# Patient Record
Sex: Male | Born: 1935 | Race: White | Hispanic: No | Smoking: Former smoker
Health system: Southern US, Community
[De-identification: ages and names within clinical notes are randomized; demographics above are authoritative.]

## PROBLEM LIST (undated history)

## (undated) DIAGNOSIS — N400 Enlarged prostate without lower urinary tract symptoms: Secondary | ICD-10-CM

## (undated) DIAGNOSIS — C801 Malignant (primary) neoplasm, unspecified: Secondary | ICD-10-CM

## (undated) DIAGNOSIS — E785 Hyperlipidemia, unspecified: Secondary | ICD-10-CM

## (undated) DIAGNOSIS — G459 Transient cerebral ischemic attack, unspecified: Secondary | ICD-10-CM

## (undated) DIAGNOSIS — K219 Gastro-esophageal reflux disease without esophagitis: Secondary | ICD-10-CM

## (undated) DIAGNOSIS — I340 Nonrheumatic mitral (valve) insufficiency: Secondary | ICD-10-CM

## (undated) DIAGNOSIS — I4891 Unspecified atrial fibrillation: Secondary | ICD-10-CM

## (undated) DIAGNOSIS — N1832 Chronic kidney disease, stage 3b: Secondary | ICD-10-CM

## (undated) DIAGNOSIS — I10 Essential (primary) hypertension: Secondary | ICD-10-CM

## (undated) DIAGNOSIS — I499 Cardiac arrhythmia, unspecified: Secondary | ICD-10-CM

## (undated) DIAGNOSIS — I714 Abdominal aortic aneurysm, without rupture, unspecified: Secondary | ICD-10-CM

## (undated) DIAGNOSIS — K279 Peptic ulcer, site unspecified, unspecified as acute or chronic, without hemorrhage or perforation: Secondary | ICD-10-CM

## (undated) DIAGNOSIS — I5020 Unspecified systolic (congestive) heart failure: Secondary | ICD-10-CM

## (undated) DIAGNOSIS — E119 Type 2 diabetes mellitus without complications: Secondary | ICD-10-CM

## (undated) DIAGNOSIS — K4021 Bilateral inguinal hernia, without obstruction or gangrene, recurrent: Secondary | ICD-10-CM

## (undated) DIAGNOSIS — Z951 Presence of aortocoronary bypass graft: Secondary | ICD-10-CM

## (undated) DIAGNOSIS — K449 Diaphragmatic hernia without obstruction or gangrene: Secondary | ICD-10-CM

## (undated) DIAGNOSIS — N200 Calculus of kidney: Secondary | ICD-10-CM

## (undated) DIAGNOSIS — I739 Peripheral vascular disease, unspecified: Secondary | ICD-10-CM

## (undated) DIAGNOSIS — K37 Unspecified appendicitis: Secondary | ICD-10-CM

## (undated) DIAGNOSIS — Z9289 Personal history of other medical treatment: Secondary | ICD-10-CM

## (undated) DIAGNOSIS — N183 Chronic kidney disease, stage 3 unspecified: Secondary | ICD-10-CM

## (undated) DIAGNOSIS — Z7901 Long term (current) use of anticoagulants: Secondary | ICD-10-CM

## (undated) DIAGNOSIS — Z87442 Personal history of urinary calculi: Secondary | ICD-10-CM

## (undated) DIAGNOSIS — L570 Actinic keratosis: Secondary | ICD-10-CM

## (undated) DIAGNOSIS — E291 Testicular hypofunction: Secondary | ICD-10-CM

## (undated) DIAGNOSIS — I219 Acute myocardial infarction, unspecified: Secondary | ICD-10-CM

## (undated) DIAGNOSIS — M1990 Unspecified osteoarthritis, unspecified site: Secondary | ICD-10-CM

## (undated) DIAGNOSIS — I259 Chronic ischemic heart disease, unspecified: Secondary | ICD-10-CM

## (undated) DIAGNOSIS — M4800 Spinal stenosis, site unspecified: Secondary | ICD-10-CM

## (undated) DIAGNOSIS — K4090 Unilateral inguinal hernia, without obstruction or gangrene, not specified as recurrent: Secondary | ICD-10-CM

## (undated) HISTORY — DX: Unspecified systolic (congestive) heart failure: I50.20

## (undated) HISTORY — DX: Actinic keratosis: L57.0

## (undated) HISTORY — DX: Calculus of kidney: N20.0

## (undated) HISTORY — DX: Presence of aortocoronary bypass graft: Z95.1

## (undated) HISTORY — PX: EYE SURGERY: SHX253

## (undated) HISTORY — PX: ABDOMINAL AORTIC ANEURYSM REPAIR: SUR1152

## (undated) HISTORY — PX: APPENDECTOMY: SHX54

## (undated) HISTORY — DX: Abdominal aortic aneurysm, without rupture, unspecified: I71.40

## (undated) HISTORY — PX: BRAIN SURGERY: SHX531

## (undated) HISTORY — PX: CATARACT EXTRACTION W/ INTRAOCULAR LENS IMPLANT: SHX1309

## (undated) HISTORY — DX: Transient cerebral ischemic attack, unspecified: G45.9

## (undated) HISTORY — DX: Abdominal aortic aneurysm, without rupture: I71.4

---

## 1988-01-25 DIAGNOSIS — Z9289 Personal history of other medical treatment: Secondary | ICD-10-CM

## 1988-01-25 DIAGNOSIS — I219 Acute myocardial infarction, unspecified: Secondary | ICD-10-CM

## 1988-01-25 DIAGNOSIS — I2510 Atherosclerotic heart disease of native coronary artery without angina pectoris: Secondary | ICD-10-CM

## 1988-01-25 HISTORY — DX: Atherosclerotic heart disease of native coronary artery without angina pectoris: I25.10

## 1988-01-25 HISTORY — DX: Acute myocardial infarction, unspecified: I21.9

## 1988-01-25 HISTORY — DX: Personal history of other medical treatment: Z92.89

## 1988-01-25 HISTORY — PX: CARDIAC CATHETERIZATION: SHX172

## 1988-01-25 HISTORY — PX: OTHER SURGICAL HISTORY: SHX169

## 1988-01-25 HISTORY — PX: CORONARY ARTERY BYPASS GRAFT: SHX141

## 1988-01-25 HISTORY — PX: CARDIAC SURGERY: SHX584

## 1988-07-01 DIAGNOSIS — I2109 ST elevation (STEMI) myocardial infarction involving other coronary artery of anterior wall: Secondary | ICD-10-CM

## 1988-07-01 HISTORY — DX: ST elevation (STEMI) myocardial infarction involving other coronary artery of anterior wall: I21.09

## 1988-07-01 HISTORY — PX: LEFT HEART CATH AND CORONARY ANGIOGRAPHY: CATH118249

## 1988-07-01 HISTORY — PX: PTCA: SHX146

## 1988-07-07 HISTORY — PX: LEFT HEART CATH AND CORONARY ANGIOGRAPHY: CATH118249

## 1988-07-15 DIAGNOSIS — Z951 Presence of aortocoronary bypass graft: Secondary | ICD-10-CM

## 1988-07-15 HISTORY — PX: CORONARY ARTERY BYPASS GRAFT: SHX141

## 1988-07-15 HISTORY — DX: Presence of aortocoronary bypass graft: Z95.1

## 1999-05-20 ENCOUNTER — Emergency Department (HOSPITAL_COMMUNITY): Admission: EM | Admit: 1999-05-20 | Discharge: 1999-05-20 | Payer: Self-pay

## 1999-05-20 ENCOUNTER — Encounter: Payer: Self-pay | Admitting: Emergency Medicine

## 2000-12-26 HISTORY — PX: ABDOMINAL AORTIC ANEURYSM REPAIR: SUR1152

## 2003-07-31 ENCOUNTER — Other Ambulatory Visit: Payer: Self-pay

## 2004-03-10 ENCOUNTER — Ambulatory Visit: Payer: Self-pay | Admitting: Internal Medicine

## 2004-05-19 ENCOUNTER — Ambulatory Visit: Payer: Self-pay | Admitting: Internal Medicine

## 2007-03-28 ENCOUNTER — Other Ambulatory Visit: Payer: Self-pay

## 2007-03-28 ENCOUNTER — Emergency Department: Payer: Self-pay | Admitting: Emergency Medicine

## 2008-08-30 ENCOUNTER — Emergency Department: Payer: Self-pay | Admitting: Unknown Physician Specialty

## 2008-09-10 ENCOUNTER — Ambulatory Visit: Payer: Self-pay | Admitting: General Surgery

## 2008-11-10 HISTORY — PX: CHOLECYSTECTOMY: SHX55

## 2009-08-27 ENCOUNTER — Emergency Department: Payer: Self-pay | Admitting: Emergency Medicine

## 2010-02-24 ENCOUNTER — Ambulatory Visit: Payer: Self-pay | Admitting: Internal Medicine

## 2010-10-18 ENCOUNTER — Ambulatory Visit: Payer: Self-pay | Admitting: Internal Medicine

## 2011-10-26 ENCOUNTER — Ambulatory Visit: Payer: Self-pay | Admitting: Ophthalmology

## 2011-12-20 ENCOUNTER — Observation Stay: Payer: Self-pay | Admitting: Internal Medicine

## 2011-12-20 LAB — CBC
HCT: 37.4 % — ABNORMAL LOW (ref 40.0–52.0)
HGB: 12.9 g/dL — ABNORMAL LOW (ref 13.0–18.0)
MCH: 30.5 pg (ref 26.0–34.0)
MCHC: 34.3 g/dL (ref 32.0–36.0)
MCV: 89 fL (ref 80–100)
Platelet: 186 10*3/uL (ref 150–440)
RBC: 4.21 10*6/uL — ABNORMAL LOW (ref 4.40–5.90)
RDW: 14.1 % (ref 11.5–14.5)
WBC: 10.1 10*3/uL (ref 3.8–10.6)

## 2011-12-20 LAB — BASIC METABOLIC PANEL
Anion Gap: 9 (ref 7–16)
BUN: 25 mg/dL — ABNORMAL HIGH (ref 7–18)
Calcium, Total: 8.8 mg/dL (ref 8.5–10.1)
Chloride: 108 mmol/L — ABNORMAL HIGH (ref 98–107)
Co2: 27 mmol/L (ref 21–32)
Creatinine: 1.54 mg/dL — ABNORMAL HIGH (ref 0.60–1.30)
EGFR (African American): 49 — ABNORMAL LOW
EGFR (Non-African Amer.): 42 — ABNORMAL LOW
Glucose: 158 mg/dL — ABNORMAL HIGH (ref 65–99)
Osmolality: 295 (ref 275–301)
Potassium: 3.3 mmol/L — ABNORMAL LOW (ref 3.5–5.1)
Sodium: 144 mmol/L (ref 136–145)

## 2011-12-20 LAB — HEPATIC FUNCTION PANEL A (ARMC)
Albumin: 3.5 g/dL (ref 3.4–5.0)
Alkaline Phosphatase: 70 U/L (ref 50–136)
Bilirubin, Direct: <0.1 mg/dL (ref 0.00–0.20)
Bilirubin,Total: 0.4 mg/dL (ref 0.2–1.0)
SGOT(AST): 23 U/L (ref 15–37)
SGPT (ALT): 27 U/L (ref 12–78)
Total Protein: 6.5 g/dL (ref 6.4–8.2)

## 2011-12-20 LAB — MAGNESIUM: Magnesium: 1.7 mg/dL — ABNORMAL LOW

## 2011-12-20 LAB — CK TOTAL AND CKMB (NOT AT ARMC)
CK, Total: 57 U/L (ref 35–232)
CK-MB: <0.5 ng/mL — ABNORMAL LOW (ref 0.5–3.6)

## 2011-12-20 LAB — PROTIME-INR
INR: 1
Prothrombin Time: 13.1 secs (ref 11.5–14.7)

## 2011-12-20 LAB — TSH: Thyroid Stimulating Horm: 1.22 u[IU]/mL

## 2011-12-20 LAB — TROPONIN I: Troponin-I: <0.02 ng/mL

## 2011-12-20 LAB — APTT: Activated PTT: <23 secs — ABNORMAL LOW (ref 23.6–35.9)

## 2013-11-28 DIAGNOSIS — I259 Chronic ischemic heart disease, unspecified: Secondary | ICD-10-CM | POA: Insufficient documentation

## 2013-11-28 DIAGNOSIS — N289 Disorder of kidney and ureter, unspecified: Secondary | ICD-10-CM | POA: Insufficient documentation

## 2013-11-28 DIAGNOSIS — E119 Type 2 diabetes mellitus without complications: Secondary | ICD-10-CM | POA: Insufficient documentation

## 2013-11-28 DIAGNOSIS — M4800 Spinal stenosis, site unspecified: Secondary | ICD-10-CM | POA: Insufficient documentation

## 2013-11-28 DIAGNOSIS — I10 Essential (primary) hypertension: Secondary | ICD-10-CM | POA: Insufficient documentation

## 2014-03-25 ENCOUNTER — Emergency Department: Payer: Self-pay | Admitting: Student

## 2014-03-27 ENCOUNTER — Ambulatory Visit: Payer: Self-pay | Admitting: Internal Medicine

## 2014-05-13 NOTE — H&P (Signed)
PATIENT NAME:  [name], [name] MR#:  [id number] DATE OF BIRTH:  June 25, 1930  DATE OF ADMISSION:  12/20/2011  REFERRING PHYSICIAN: Dr. Renard Hamper  PRIMARY CARE PHYSICIAN: Dr. Lisette Grinder   CHIEF COMPLAINT: Chest pain.   HISTORY OF PRESENT ILLNESS: This is a 81 year old male with known past medical history of hypertension, diabetes, hyperlipidemia and history of coronary artery disease status post CABG in 1990 presents with complaint of chest pain. Patient reports chest pain started this evening where it woke him up from sleep, where he reports it has been mostly chest pressure in the left retrosternal area, nonradiating and happened in sleep. Patient reports chest pain resolved after he took two sublingual nitro. Patient reports he had similar episode before six months ago which was resolved after he took 1 pill of sublingual nitro then as well. As well patient was complaining of some mild nausea and significant diaphoresis and lightheadedness and dizziness accompanying that episode as well. In ED patient had first set of cardiac enzymes was negative. As well had no significant EKG changes where he used to have some Q waves in inferior leads which was old from previous EKG. Currently patient denies any chest pain. Hospitalist service was requested to admit the patient for further management and work-up of his chest pain.   PAST MEDICAL HISTORY:  1. Hypertension.  2. Ischemic heart disease with CABG in 1990.  3. Osteoarthritis.  4. Gastroesophageal reflux disease.  5. Peptic ulcer disease.  6. Diabetes mellitus controlled with diet.  7. Benign prostatic hypertrophy.  8. Nephrolithiasis.  9. History of abdominal aortic aneurysm repair.  10. Spinal stenosis.  11. Renal insufficiency.  12. Hyperlipidemia.  13. Hypogonadism.  14. Laparoscopic cholecystectomy in 2010.   HOME MEDICATIONS:  1. Aspirin 81 mg daily.  2. Lipitor 40 mg daily.  3. Lisinopril/hydrochlorothiazide 20/12.5 mg oral daily.    ALLERGIES: Sulfa and morphine.   FAMILY HISTORY: Significant for hypertension and heart disease.   SOCIAL HISTORY: Does not smoke, drink, or use recreational drugs.   REVIEW OF SYSTEMS: CONSTITUTIONAL: Denies any fever, fatigue, weakness. EYES: Denies blurry vision, double vision, pain. ENT: Denies tinnitus, ear pain, hearing loss, epistaxis or discharge. RESPIRATORY: Denies cough, wheezing, hemoptysis, dyspnea. CARDIOVASCULAR: Has complaint of chest pain. Denies edema, arrhythmia, palpitations, syncope. GASTROINTESTINAL: Denies diarrhea, constipation, abdominal pain, vomiting. Had mild complaint of nausea. GENITOURINARY: Denies dysuria, hematuria, renal colic. ENDO: Denies polyuria, polydipsia, heat or cold intolerance. INTEGUMENTARY: Denies any acne, rash, or skin lesions. NEURO: Denies any numbness, weakness, dysarthria, epilepsy, tremors, dementia. PSYCHIATRIC: Denies anxiety, schizophrenia, insomnia, nervousness, or bipolar disorder.   PHYSICAL EXAMINATION:  VITAL SIGNS: Temperature 98.1, pulse 58, respiratory rate 20, blood pressure 117/56, saturating 97% on room air.   GENERAL: Elderly male, looks comfortable in bed in no apparent distress.   HEENT: Head atraumatic, normocephalic. Pupils equal, reactive to light. Pink conjunctivae. Anicteric sclerae. Moist oral mucosa.   NECK: Supple. No thyromegaly. No JVD.   CHEST: Good air entry bilaterally. No wheezing, rales, rhonchi.   CARDIOVASCULAR: S1, S2 heard. No rubs, murmur, gallops.   ABDOMEN: Soft, nontender, nondistended. Bowel sounds present.   EXTREMITIES: No edema. No clubbing. No cyanosis.   PSYCHIATRIC: Pleasant, appropriate affect. Awake, alert x3. Intact judgment and insight.   SKIN: Warm and dry. Normal skin turgor. No rash.   NEUROLOGIC: Awake, alert x3. Cranial nerves grossly intact. Motor 5/5. No focal deficits.   LABORATORY, DIAGNOSTIC AND RADIOLOGICAL DATA: Glucose 158, BUN 25, creatinine 1.54, sodium 144,  potassium  3.3, chloride 108, CO2 27. TSH 1.22, troponin less than 0.02, white blood cells 10.1, hemoglobin 12.9, hematocrit 37.4, platelets 186. INR 1.   EKG showing some Q waves in inferior leads which is old once compared to previous EKG in 2011.   ASSESSMENT AND PLAN:  1. Chest pain. Patient had chest pain resolved with nitro, with known history of hyperlipidemia, coronary artery disease, hypertension and diabetes mellitus. Patient was given 324 of aspirin in ED, continue patient on aspirin. He is already on ACE inhibitor and statin. Patient's heart rate on the lower side so will hold on starting beta blocker. Will cycle troponins, and if negative will schedule patient for stress test Myoview scan.   2. Chronic kidney disease. Appears to be at baseline. Monitor closely.  3. Diabetes mellitus controlled with diet. Will have patient on Accu-Cheks.  4. Hypokalemia. Will replace. Recheck in a.m.  5. Hypomagnesemia. Will replace and recheck in a.m.  6. Hyperlipidemia. Continue with statin.  7. Diabetes mellitus controlled with diet. Continue with Accu-Cheks. 8. Hypertension, controlled. Continue with home medication.  9. Deep vein thrombosis prophylaxis. Sub-Q heparin.  10. GI prophylaxis. On Protonix.  11. CODE STATUS: FULL CODE.   TOTAL TIME SPENT ON ADMISSION AND PATIENT CARE: 55 minutes.  ____________________________ Albertine Patricia, MD dse:cms D: 12/20/2011 04:32:42 ET T: 12/20/2011 08:56:05 ET  JOB#: [id number] cc: Hewitt Blade. Sarina Ser, MD Ayano Douthitt Graciela Husbands MD ELECTRONICALLY SIGNED 12/22/2011 0:21

## 2014-05-25 NOTE — Consult Note (Signed)
PATIENT NAME:  [name], [name] MR#:  [id number] DATE OF BIRTH:  [birth year]  DATE OF CONSULTATION:  03/27/2014  REFERRING PHYSICIAN:   CONSULTING PHYSICIAN:  Algernon Huxley, MD  REQUESTING PHYSICIAN: Dwayne D. Callwood, MD  REASON FOR CONSULTATION: Left subclavian artery stenosis as well as a history of abdominal aortic aneurysm and peripheral arterial disease.   HISTORY OF PRESENT ILLNESS: This is an 81 year old male who was having a cardiac catheterization performed for recent chest pain. He has a history of coronary disease and is status post coronary bypass grafting over 20 years ago with his chest pain. It was refractory to nitroglycerin and aspirin and he went to the ER 2 days prior. His cardiac catheterization performed demonstrated what appeared to be at least a moderate and possibly high-grade stenosis of the left subclavian artery proximal to his LIMA bypass. Dr. Clayborn Bigness was unable to cross this to fully evaluate this. In addition, the patient has a history of abdominal aortic aneurysm repair that has not been checked in many years. He reports this being fixed about 10 years ago. I am not sure if it has been checked within the last 5 years. He also describes some pain in his legs with activity worrisome for claudication. His exam today is somewhat limited, as he is seen on the cardiac catheterization table at the conclusion of his procedure. He has received sedation, although he is able to provide some history. He does not currently have any chest pain today and no coronary intervention was performed.  PAST MEDICAL HISTORY: Significant for: 1.  Hypertension.  2.  Coronary disease, status post coronary bypass grafting in 1990.  3.  Hyperlipidemia.  4.  Renal insufficiency.  5.  Spinal stenosis.  6.  Abdominal aortic aneurysm, status post repair.  7.  Nephrolithiasis.  8.  Diabetes.  9.  Gastroesophageal reflux disease/peptic ulcer disease.   PAST SURGICAL HISTORY: Includes: 1.  CABG in  1990.  2.  AAA in 2005.  3.  Laparoscopic cholecystectomy in 2010.   FAMILY HISTORY: Has multiple members with hypertension and heart disease.   SOCIAL HISTORY: He quit smoking in 1990 with his heart attack and coronary artery bypass grafting. He does not drink or use recreational carotid drugs. He lives at home with his wife.  HOME MEDICATIONS: Include: 1.  Hydrochlorothiazide and lisinopril 12.5/20 daily.  2.  Lovastatin 40 mg daily.  3.  Aspirin 81 mg daily.   ALLERGIES: MORPHINE AND SULFA DRUGS.  REVIEW OF SYSTEMS: GENERAL: No fevers or chills. No unintentional weight loss or gain.  EYES: No blurry or double vision.  EARS: No tinnitus or ear pain.  CARDIAC: Chest pain, which was the prompting symptom for his cardiac catheterization. No arrhythmia or palpitations.  RESPIRATORY: No shortness of breath or cough.  GASTROINTESTINAL: No nausea, vomiting, or diarrhea.  GENITOURINARY: No dysuria or hematuria.  ENDOCRINE: No heat or cold intolerance.  SKIN: No new rashes, ulcers, or lesions.  MUSCULOSKELETAL: No joint swelling, positive for some osteoarthritis, which was chronic.  NEUROLOGIC: No TIA, stroke, or seizure.  PSYCHIATRIC: No anxiety or depression.   PHYSICAL EXAMINATION: VITAL SIGNS: Show a temperature of 97.9, pulse 71. Blood pressure is 168/86, saturations are 100% on room air. GENERAL: He is a well-developed, well-nourished white male lying on the cardiac catheterization table, not in apparent distress.  HEENT: Normocephalic and atraumatic. Eyes: Sclerae are nonicteric. Conjunctivae are clear. Ears: Normal external appearance. Hearing appears to be intact.  NECK: Supple without adenopathy or  JVD.  HEART: Regular rate and rhythm without murmurs.  LUNGS: Clear to auscultation bilaterally. Radial pulses are 1 to 2+ bilaterally.  ABDOMEN: Soft, nondistended, nontender. Surgical scars are well healed.  EXTREMITIES: Warm and well perfused. His pedal pulses are  1+. NEUROLOGICAL: Normal strength and tone in all 4 extremities.  PSYCHIATRIC: Normal affect and mood.  SKIN: Warm and dry with good turgor.  DIAGNOSTIC DATA: Catheterization images, which I have reviewed do appear to show at least a moderate, if not a titer left subclavian artery stenosis proximal to his LIMA bypass graft. There was also possible lesion just beyond this.   LABORATORY EVALUATIONS: From 2 days prior to show a sodium of 142, potassium is 3.5 chloride is 104, CO2 is 29, BUN is 22, creatinine is 1.74, glucose is 192. White blood cell count is 9.4, hemoglobin is 14.2, platelet count is 193,000. His troponin was normal. INR is 1.0.   ASSESSMENT AND PLAN: This is an 81 year old gentleman who appears to have subclavian artery lesion proximal to his left internal mammary artery bypass graft on a cardiac catheterization today. We will bring him back to the office and evaluate this with noninvasive studies. With his renal insufficiency, if this does appear to be hemodynamically significant stenosis, I would recommend intervention, as this proximal to his main coronary bypass graft and would certainly potentially cause angina or heart issues. Low subclavian lesions that are not associated with distal bypass graft are not largely symptomatic, but in this case, this is an indication for treatment. In addition, he has not had any further evaluation for aneurysmal disease after repair about a decade ago on ultrasound. It would be reasonable to make sure he does not have any proximal or distal disease and his repair is intact. Also with claudication symptoms, ankle-brachial indexes can be performed as an outpatient as well. I have discussed this with the patient and will arrange outpatient followup. No further inpatient testing is necessary. This is a level 3 consultation.    ____________________________ Algernon Huxley, MD jsd:sw D: 03/31/2014 09:57:27 ET T: 03/31/2014 12:50:06  ET JOB#: [id number]  cc: Algernon Huxley, MD, <Dictator> Algernon Huxley MD ELECTRONICALLY SIGNED 04/03/2014 10:37

## 2014-05-25 NOTE — Consult Note (Signed)
PATIENT NAME:  [name], [name] MR#:  [id number] DATE OF BIRTH:  12/10/1930  DATE OF CONSULTATION:  03/25/2014  CONSULTING PHYSICIAN:  Ceasar Lund. Anselm Jungling, MD  PRIMARY CARE PHYSICIAN: Lisette Grinder, MD  REFERRING PHYSICIAN: Loura Pardon, MD   CHIEF COMPLAINT: Chest pain.   HISTORY OF PRESENT ILLNESS: This is an 81 year old male who has history of coronary artery bypass graft in 1990 and was a smoker before that. Has also had surgery for abdominal aortic aneurysm repair in 2005. He has hypertension, diet controlled diabetes and hyperlipidemia, who lives at home and is active for his age, just some arthritis issues, but does not require any support. He has never had any chest pain on exertion or activities. Tuesday morning he has severe epigastric region pain associated with excessive sweating. The pain was going to his back but not radiating to arm. He took his baby aspirin 81 mg and took 1 nitroglycerin tablet. There was no relief so after a few minutes he took 1 of the nitroglycerin tablet which helped to resolve the pain. He denies that this pain was similar to the pain that he had in the past with his cardiac issues when he required bypass surgery. He denies any nausea, vomiting, or shortness of breath with this and he feels completely fine currently and ER physician contacted the hospitalist service for admission. When I went into the room and I asked the patient about his medical history and about our plan he refused to get admitted on observation as his insurance does not pay for that and asked me if there was any way we can arrange the outpatient thing, so we are trying to arrange for that.   REVIEW OF SYSTEMS: CONSTITUTIONAL: Negative for fever, fatigue, weakness, pain or weight loss.  EYES: No blurring, double vision, discharge or redness.  EARS, NOSE, THROAT: No tinnitus, ear pain, or hearing loss.  RESPIRATORY: No cough, wheezing, rhonchi, or shortness of breath.  CARDIOVASCULAR: The  patient has some chest pain. No orthopnea, edema, arrhythmia, or palpitation.  GASTROINTESTINAL: No nausea, vomiting, diarrhea, or abdominal pain.  GENITOURINARY: No dysuria, hematuria, or increased frequency.  ENDOCRINE: No heat or cold intolerance. No excessive sweating.  SKIN: No acne, rashes, or lesions.  MUSCULOSKELETAL: No pain or swelling in the joints.  NEUROLOGICAL: No numbness, weakness, tremor, or vertigo.  PSYCHIATRIC: No anxiety, insomnia, or bipolar disorder.   PAST MEDICAL HISTORY:  1.  Hypertension.  2.  Ischemic heart disease with CABG in 1990.  3.  Osteoarthritis.  4.  Gastroesophageal reflux disease.  5.  Peptic ulcer disease.  6.  Diabetes mellitus controlled with diet.  7.  Nephrolithiasis.  8.  History of abdominal aortic aneurysm repair.  9.  Spinal stenosis.  10.  Renal insufficiency.  11.  Hyperlipidemia.  12.  Hypogonadism.  13.  Laparoscopic cholecystectomy in 2010.   FAMILY HISTORY: Significant for hypertension and heart disease.   SOCIAL HISTORY:  He does not smoke, drink, or use recreational drug. He used to smoke but quit in 1990 with his heart attack. He lives at home with wife and walks without any support, only some restriction because of arthritis issues.   HOME MEDICATIONS:  1.  Hydrochlorothiazide and lisinopril 12.5 mg/20 mg oral once a day. 2.  Lovastatin 40 mg oral tablet once a day.  3.  Aspirin 81 mg once a day.   PHYSICAL EXAMINATION:  VITAL SIGNS: In ER, temperature 97.6, pulse 80, respirations 18, blood pressure 143/75, and pulse oximetry  is 100% on room air.  GENERAL: The patient is fully alert and oriented to time, place, and person. Does not appear in any acute distress.  HEENT: Head and neck atraumatic. Conjunctivae pink. Oral mucosa moist.  NECK: Supple. No JVD. Thyroid is nontender.  RESPIRATORY: Bilateral equal and clear air entry. No crepitation or wheezing. No use of muscles.  CARDIOVASCULAR: Nontender on local palpation.  S1, S2 present, regular. No murmur.  ABDOMEN: Soft, nontender. Bowel sounds present. No organomegaly.  SKIN: No acne, rashes, or lesions. Skin is warm to touch,  NEUROLOGICAL: Power 5/5 and follows commands. No tremor or rigidity. Sensation intact.  Joints: No swelling or tenderness. Legs: No edema.  PSYCHIATRIC: Does not appear in any acute psychiatric illness at this time.   IMPORTANT LABORATORY RESULTS: Glucose 192, BUN 22, creatinine 1.74, sodium 142, potassium 3.5, chloride 104, CO2 of 29, calcium is 9.2. Total protein 6.6, albumin 3.4, bilirubin 0.8, alkaline phosphate 77, SGOT 25, SGPT 24. Troponin 0.02. WBC 7.5, hemoglobin 13.8, platelet count 199,000, MCV 91. INR is 1.   ASSESSMENT AND PLAN: An 81 year old man with a history of coronary artery bypass surgery 25 years ago and aortic aneurysm repair surgery more than 10 years ago, had sudden onset chest pain today. It subsided with 2 nitroglycerin tablets, currently feeling completely fine. Troponin negative. EKG unchanged from previous. 1.  Chest pain. As the patient has a history of coronary artery disease we would like to rule out an acute coronary event or any worsening while keeping on observation, but the patient refused to come to the hospital on observation as his insurance does not pay for that. He agreed to have the work-up in the ER and then go and follow up in cardiology clinic. I spoke to the cardiologist, Dr. Clayborn Bigness was on call, and he agreed to see the patient later today, so will follow his second troponin. If it comes out negative with not much rise then we can safely discharge him and he has an appointment set up today at 3:30 p.m. with Dr. Clayborn Bigness in the office so he can take care of further if any cardiac work-up needed. Currently, the patient is completely fine and does not have any symptoms and is willing to go home.  2.  History of coronary artery disease. We will continue his statin, aspirin, and blood pressure control.   3.  Hyperlipidemia. Advise to continue statin.  4.  Hypertension. Advised to continue hydrochlorothiazide and losartan as he is taking at home.  5.  History of abdominal aortic aneurysm repair and he had this chest pain. His CBC is normal. The pain is subsided right now anyways. He does not have any nausea or vomiting. We would like to check one more CBC with his next troponin to make sure it stays stable. Currently the patient is pain free.  CODE STATUS: Full code.   TOTAL TIME SPENT ON THIS CONSULT: 50 minutes.   Plan was explained to patient and discussed with Dr. Clayborn Bigness, the ER physician, and the ER nurse.     ____________________________ Ceasar Lund. Anselm Jungling, MD vgv:mc D: 03/25/2014 11:27:45 ET T: 03/25/2014 11:51:30 ET JOB#: [id number]  cc: Ceasar Lund. Anselm Jungling, MD, <Dictator> Dwayne D. Clayborn Bigness, Archie Sarina Ser, MD  Rosalio Macadamia [facility] MD ELECTRONICALLY SIGNED 04/10/2014 11:43

## 2014-12-26 ENCOUNTER — Encounter: Payer: Self-pay | Admitting: Medical Oncology

## 2014-12-26 ENCOUNTER — Emergency Department: Payer: Medicare PPO

## 2014-12-26 ENCOUNTER — Emergency Department
Admission: EM | Admit: 2014-12-26 | Discharge: 2014-12-26 | Disposition: A | Discharge status: Home / Self Care | Payer: Medicare PPO | Attending: Emergency Medicine | Admitting: Emergency Medicine

## 2014-12-26 DIAGNOSIS — I10 Essential (primary) hypertension: Secondary | ICD-10-CM | POA: Insufficient documentation

## 2014-12-26 DIAGNOSIS — R55 Syncope and collapse: Secondary | ICD-10-CM | POA: Diagnosis not present

## 2014-12-26 DIAGNOSIS — Z87891 Personal history of nicotine dependence: Secondary | ICD-10-CM | POA: Diagnosis not present

## 2014-12-26 DIAGNOSIS — R531 Weakness: Secondary | ICD-10-CM | POA: Diagnosis present

## 2014-12-26 DIAGNOSIS — R110 Nausea: Secondary | ICD-10-CM | POA: Insufficient documentation

## 2014-12-26 DIAGNOSIS — E119 Type 2 diabetes mellitus without complications: Secondary | ICD-10-CM | POA: Diagnosis not present

## 2014-12-26 HISTORY — DX: Essential (primary) hypertension: I10

## 2014-12-26 HISTORY — DX: Type 2 diabetes mellitus without complications: E11.9

## 2014-12-26 HISTORY — DX: Acute myocardial infarction, unspecified: I21.9

## 2014-12-26 LAB — CBC
HEMATOCRIT: 40.6 % (ref 40.0–52.0)
Hemoglobin: 13 g/dL (ref 13.0–18.0)
MCH: 28.9 pg (ref 26.0–34.0)
MCHC: 31.9 g/dL — AB (ref 32.0–36.0)
MCV: 90.6 fL (ref 80.0–100.0)
PLATELETS: 183 10*3/uL (ref 150–440)
RBC: 4.48 MIL/uL (ref 4.40–5.90)
RDW: 13.9 % (ref 11.5–14.5)
WBC: 13.2 10*3/uL — AB (ref 3.8–10.6)

## 2014-12-26 LAB — GLUCOSE, CAPILLARY: Glucose-Capillary: 172 mg/dL — ABNORMAL HIGH (ref 65–99)

## 2014-12-26 LAB — BASIC METABOLIC PANEL
Anion gap: 12 (ref 5–15)
BUN: 26 mg/dL — AB (ref 6–20)
CHLORIDE: 100 mmol/L — AB (ref 101–111)
CO2: 24 mmol/L (ref 22–32)
CREATININE: 1.68 mg/dL — AB (ref 0.61–1.24)
Calcium: 9.4 mg/dL (ref 8.9–10.3)
GFR calc Af Amer: 42 mL/min — ABNORMAL LOW (ref >60)
GFR calc non Af Amer: 36 mL/min — ABNORMAL LOW (ref >60)
GLUCOSE: 179 mg/dL — AB (ref 65–99)
POTASSIUM: 3.3 mmol/L — AB (ref 3.5–5.1)
SODIUM: 136 mmol/L (ref 135–145)

## 2014-12-26 LAB — TROPONIN I: Troponin I: <0.03 ng/mL (ref <0.031)

## 2014-12-26 NOTE — ED Provider Notes (Signed)
[facility] Emergency Department Provider Note    ____________________________________________  Time seen: 1515  I have reviewed the triage vital signs and the nursing notes.   HISTORY  Chief Complaint Weakness and Nausea   History limited by: Not Limited   HPI SCHAEFER OSBURN is a 81 y.o. male with history of MI and open heart surgery in the past who presents to the emergency department today after a near syncopal episode. The patient states that he had been blowing leaves and then went to change the battery on his lawnmower. When he bent down he noticed he started breaking out in a sweat. He started feeling ill. He started feeling lightheaded. He went inside and sat down. The symptoms gradually improved. He denies any chest pain during this episode. He denies any shortness of breath. He states that he did not have any chest pain or arm pain like he had during his previous MI couple of decades ago. He states he had a negative catheterization last year. Denies any recent fevers.  Past Medical History  Diagnosis Date  . Hypertension   . Diabetes mellitus without complication ([place])   . Myocardial infarct ([place])     There are no active problems to display for this patient.   Past Surgical History  Procedure Laterality Date  . Cardiac bypass    . Cardiac surgery      No current outpatient prescriptions on file.  Allergies Morphine and related and Sulfa antibiotics  No family history on file.  Social History Social History  Substance Use Topics  . Smoking status: Former Research scientist (life sciences)  . Smokeless tobacco: None  . Alcohol Use: No    Review of Systems  Constitutional: Negative for fever. Cardiovascular: Negative for chest pain. Respiratory: Negative for shortness of breath. Gastrointestinal: Negative for abdominal pain, vomiting and diarrhea. Genitourinary: Negative for dysuria. Musculoskeletal: Negative for back pain. Skin: Negative for  rash. Neurological: Negative for headaches, focal weakness or numbness.  10-point ROS otherwise negative.  ____________________________________________   PHYSICAL EXAM:  VITAL SIGNS: ED Triage Vitals  Enc Vitals Group     BP 12/26/14 1404 114/56 mmHg     Pulse Rate 12/26/14 1404 65     Resp 12/26/14 1404 17     Temp 12/26/14 1404 97.7 F (36.5 C)     Temp Source 12/26/14 1404 Oral     SpO2 12/26/14 1404 100 %     Weight 12/26/14 1404 170 lb (77.111 kg)     Height 12/26/14 1404 6' (1.829 m)   Constitutional: Alert and oriented. Well appearing and in no distress. Eyes: Conjunctivae are normal. PERRL. Normal extraocular movements. ENT   Head: Normocephalic and atraumatic.   Nose: No congestion/rhinnorhea.   Mouth/Throat: Mucous membranes are moist.   Neck: No stridor. Hematological/Lymphatic/Immunilogical: No cervical lymphadenopathy. Cardiovascular: Normal rate, regular rhythm.  No murmurs, rubs, or gallops. Respiratory: Normal respiratory effort without tachypnea nor retractions. Breath sounds are clear and equal bilaterally. No wheezes/rales/rhonchi. Gastrointestinal: Soft and nontender. No distention.  Genitourinary: Deferred Musculoskeletal: Normal range of motion in all extremities. No joint effusions.  No lower extremity tenderness nor edema. Neurologic:  Normal speech and language. No gross focal neurologic deficits are appreciated.  Skin:  Skin is warm, dry and intact. No rash noted. Psychiatric: Mood and affect are normal. Speech and behavior are normal. Patient exhibits appropriate insight and judgment.  ____________________________________________    LABS (pertinent positives/negatives)  Labs Reviewed  BASIC METABOLIC PANEL - Abnormal; Notable for the  following:    Potassium 3.3 (*)    Chloride 100 (*)    Glucose, Bld 179 (*)    BUN 26 (*)    Creatinine, Ser 1.68 (*)    GFR calc non Af Amer 36 (*)    GFR calc Af Amer 42 (*)    All other  components within normal limits  CBC - Abnormal; Notable for the following:    WBC 13.2 (*)    MCHC 31.9 (*)    All other components within normal limits  GLUCOSE, CAPILLARY - Abnormal; Notable for the following:    Glucose-Capillary 172 (*)    All other components within normal limits  TROPONIN I     ____________________________________________   EKG  I, Nance Pear, attending physician, personally viewed and interpreted this EKG  EKG Time: 1401 Rate: 65 Rhythm: junctional rhythm Axis: left axis deviation Intervals: qtc 416 QRS: narrow, q waves V1, V2 ST changes: no st elevation Impression: abnormal ekg ____________________________________________    RADIOLOGY  CXR IMPRESSION: 1. No evidence of acute cardiopulmonary disease. 2. Abdominal aortic aneurysm.  ____________________________________________   PROCEDURES  Procedure(s) performed: None  Critical Care performed: No  ____________________________________________   INITIAL IMPRESSION / ASSESSMENT AND PLAN / ED COURSE  Pertinent labs & imaging results that were available during my care of the patient were reviewed by me and considered in my medical decision making (see chart for details).  Patient presented to the emergency department today after a near syncopal episode. Patient does have cardiac history and thus I am concerned about cardiac etiology. The initial troponin was negative and I did offer admission at this time. Patient and wife declined admission however were willing to stay for a second troponin. This additionally was negative. This point unclear etiology of the near-syncopal episode. I still have some concerns that could be ACS S I did request that they follow-up with cardiologist. I did discuss return precautions with the patient.  ____________________________________________   FINAL CLINICAL IMPRESSION(S) / ED DIAGNOSES  Final diagnoses:  Near syncope     Nance Pear,  MD 12/26/14 [id number]

## 2014-12-26 NOTE — Discharge Instructions (Signed)
Please seek medical attention for any high fevers, chest pain, shortness of breath, change in behavior, persistent vomiting, bloody stool or any other new or concerning symptoms. ° ° °Near-Syncope °Near-syncope (commonly known as near fainting) is sudden weakness, dizziness, or feeling like you might pass out. During an episode of near-syncope, you may also develop pale skin, have tunnel vision, or feel sick to your stomach (nauseous). Near-syncope may occur when getting up after sitting or while standing for a long time. It is caused by a sudden decrease in blood flow to the brain. This decrease can result from various causes or triggers, most of which are not serious. However, because near-syncope can sometimes be a sign of something serious, a medical evaluation is required. The specific cause is often not determined. °HOME CARE INSTRUCTIONS  °Monitor your condition for any changes. The following actions may help to alleviate any discomfort you are experiencing: °· Have someone stay with you until you feel stable. °· Lie down right away and prop your feet up if you start feeling like you might faint. Breathe deeply and steadily. Wait until all the symptoms have passed. Most of these episodes last only a few minutes. You may feel tired for several hours.   °· Drink enough fluids to keep your urine clear or pale yellow.   °· If you are taking blood pressure or heart medicine, get up slowly when seated or lying down. Take several minutes to sit and then stand. This can reduce dizziness. °· Follow up with your health care provider as directed.  °SEEK IMMEDIATE MEDICAL CARE IF:  °· You have a severe headache.   °· You have unusual pain in the chest, abdomen, or back.   °· You are bleeding from the mouth or rectum, or you have black or tarry stool.   °· You have an irregular or very fast heartbeat.   °· You have repeated fainting or have seizure-like jerking during an episode.   °· You faint when sitting or lying down.    °· You have confusion.   °· You have difficulty walking.   °· You have severe weakness.   °· You have vision problems.   °MAKE SURE YOU:  °· Understand these instructions. °· Will watch your condition. °· Will get help right away if you are not doing well or get worse. °  °This information is not intended to replace advice given to you by your health care provider. Make sure you discuss any questions you have with your health care provider. °  °Document Released: 01/10/2005 Document Revised: 01/15/2013 Document Reviewed: 06/15/2012 °Elsevier Interactive Patient Education ©2016 [employer] ° °

## 2014-12-26 NOTE — ED Notes (Signed)
Pt reports prior to arrival he was at home when he suddenly felt like he was going to pass out, reports he got really weak and clammy and felt nauseous. Pt denies chest pain.

## 2014-12-29 ENCOUNTER — Telehealth: Payer: Self-pay | Admitting: *Deleted

## 2014-12-29 NOTE — Telephone Encounter (Signed)
lmov to schedule a new patient hosptial fu

## 2015-02-23 ENCOUNTER — Encounter: Payer: Self-pay | Admitting: *Deleted

## 2015-02-23 ENCOUNTER — Ambulatory Visit: Payer: Medicare PPO | Admitting: Cardiovascular Disease

## 2015-03-18 ENCOUNTER — Ambulatory Visit (INDEPENDENT_AMBULATORY_CARE_PROVIDER_SITE_OTHER): Payer: Medicare PPO | Admitting: Cardiology

## 2015-03-18 ENCOUNTER — Encounter: Payer: Self-pay | Admitting: Cardiology

## 2015-03-18 ENCOUNTER — Ambulatory Visit: Payer: Medicare PPO | Admitting: Cardiology

## 2015-03-18 VITALS — BP 132/80 | HR 76 | Resp 16 | Ht 72.0 in | Wt 171.8 lb

## 2015-03-18 DIAGNOSIS — I2510 Atherosclerotic heart disease of native coronary artery without angina pectoris: Secondary | ICD-10-CM

## 2015-03-18 DIAGNOSIS — R079 Chest pain, unspecified: Secondary | ICD-10-CM | POA: Diagnosis not present

## 2015-03-18 DIAGNOSIS — I714 Abdominal aortic aneurysm, without rupture, unspecified: Secondary | ICD-10-CM | POA: Insufficient documentation

## 2015-03-18 DIAGNOSIS — E785 Hyperlipidemia, unspecified: Secondary | ICD-10-CM

## 2015-03-18 DIAGNOSIS — I493 Ventricular premature depolarization: Secondary | ICD-10-CM

## 2015-03-18 DIAGNOSIS — Z951 Presence of aortocoronary bypass graft: Secondary | ICD-10-CM | POA: Diagnosis not present

## 2015-03-18 DIAGNOSIS — R61 Generalized hyperhidrosis: Secondary | ICD-10-CM

## 2015-03-18 MED ORDER — ISOSORBIDE MONONITRATE ER 30 MG PO TB24: 30.0000 mg | ORAL_TABLET | Freq: Every day | ORAL | Status: DC

## 2015-03-18 NOTE — Patient Instructions (Addendum)
Medication Instructions:  Your physician has recommended you make the following change in your medication: Start Imdur 30 mg Once daily   Labwork: CBC, CMET, TSH, and Free T4  Testing/Procedures: Your physician has recommended that you wear a holter monitor. Holter monitors are medical devices that record the heart's electrical activity. Doctors most often use these monitors to diagnose arrhythmias. Arrhythmias are problems with the speed or rhythm of the heartbeat. The monitor is a small, portable device. You can wear one while you do your normal daily activities. This is usually used to diagnose what is causing palpitations/syncope (passing out).  Date & Time: __________________________________________________  Your physician has requested that you have an echocardiogram. Echocardiography is a painless test that uses sound waves to create images of your heart. It provides your doctor with information about the size and shape of your heart and how well your heart's chambers and valves are working. This procedure takes approximately one hour. There are no restrictions for this procedure.  Date & Time: ___________________________________________________  Follow-Up: Your physician recommends that you schedule a follow-up appointment after your Echo, labs, and Holter monitor are complete in 1 week.   Date & Time: ___________________________________________________   Any Other Special Instructions Will Be Listed Below (If Applicable).     If you need a refill on your cardiac medications before your next appointment, please call your pharmacy.

## 2015-03-18 NOTE — Progress Notes (Signed)
Cardiology Office Note   Date:  03/18/2015   ID:  [name], DOB 03-13-1930, MRN [id number]  Referring Doctor:  Madelyn Brunner, MD   Cardiologist:   Wende Bushy, MD   Reason for consultation:  Chief Complaint  Patient presents with  . Dizziness    with diaphoresis      History of Present Illness: [name] is a 82 y.o. male who presents for evaluation of dizziness. He had 1 episode yesterday. He woke up, and went to the bathroom. On his way back from the bathroom to the bed, he lied down. It was at that point that he felt very dizzy, almost passing out. He turned the other way and continued to feel the same way. He decided to sit up in as soon as he did that, his symptoms resolved. However, he noted significant sweating and this reminded him of his heart attack in the past. He decided to take a nitroglycerin under the tongue. After a few minutes, his symptoms completely went away and he felt back to baseline. His dizziness and his diaphoresis were severe. The symptoms lasted for a few minutes. He does not recall having these problems prior to yesterday. He denies chest pain. He denies palpitations during this episode.  Upon questioning, patient recalls seeing [name] of [facility] last year. Per medical records, he was being seen for unstable angina, and heart catheterization was recommended. Patient recalls having had the procedure done but was not able to recall the results of the catheterization. At that time last year he was unable to follow-up with the cardiologist since he had a family emergency and had to go to [place] to attend to his sister. He was given Imdur and metoprolol. He recalls taking the medications until the refills ran out. He was unable to follow-up with the cardiologist.  For a while now, patient has been having episodes of palpitations or heart racing. This happens mostly when is lying in bed and wakes him up from sleep. The palpitations go  away as soon as he changes position. The duration is minutes at a time.  Patient reports mild shortness of breath with walking from parking lot to the office. Otherwise, he denies shortness of breath with usual chores at home or walking to and from the mailbox.  He reports occasionally feeling his heart beat skipping a beat. Severity is mild, duration is minutes or seconds.   ROS:  Please see the history of present illness. He denies chest pain and shortness of breath currently. He denies loss of consciousness.  Aside from mentioned under HPI, all other systems are reviewed and negative.     Past Medical History  Diagnosis Date  . Hypertension   . Diabetes mellitus without complication ([place])   . Myocardial infarct ([place])   . AAA (abdominal aortic aneurysm) ([place])   . AAA (abdominal aortic aneurysm) ([place])   . AAA (abdominal aortic aneurysm) ([place])   . AAA (abdominal aortic aneurysm) ([place])    hyperlipidemia  AAA   Past Surgical History  Procedure Laterality Date  . Cardiac bypass    . Cardiac surgery    . Abdominal aortic aneurysm repair  2005, [facility]    patient reports receiving cardiac stents prior to CABG. p CABG 3, 07/15/1988, care of Dr. Worthy Rancher. AAA s/p repair 2005 at [facility]    reports that he has quit smoking. He does not have any smokeless tobacco history on file. He reports that  he does not drink alcohol.  Patient quit smoking in 1990 after he had the bypass. He started smoking at age 45 years old. He smoked one pack a day  family history includes CAD in his father; Cerebral aneurysm in his mother.   Current Outpatient Prescriptions  Medication Sig Dispense Refill  . aspirin EC 81 MG tablet Take 81 mg by mouth daily.    Marland Kitchen atorvastatin (LIPITOR) 40 MG tablet Take 40 mg by mouth daily.    . Cyanocobalamin (VITAMIN B 12 PO) Take 1 tablet by mouth daily.    Marland Kitchen glucose blood (BAYER CONTOUR TEST) test strip 1 strip by Does not apply route 3 (three) times daily.    Marland Kitchen  lisinopril-hydrochlorothiazide (PRINZIDE,ZESTORETIC) 20-12.5 MG tablet Take 1 tablet by mouth daily.    . Meclizine HCl 25 MG CHEW Chew 25 mg by mouth 3 (three) times daily as needed. As needed for dizziness    . metoprolol tartrate (LOPRESSOR) 25 MG tablet Take 25 mg by mouth 2 (two) times daily.    . Multiple Vitamins-Minerals (MULTIVITAMIN & MINERAL PO) Take 1 tablet by mouth daily.    . Vitamin D, Cholecalciferol, 1000 units CAPS Take 1 tablet by mouth daily.    Marland Kitchen ZANTAC 150 MG tablet Take 1 tablet by mouth daily.    . isosorbide mononitrate (IMDUR) 30 MG 24 hr tablet Take 1 tablet (30 mg total) by mouth daily. 90 tablet 3   No current facility-administered medications for this visit.    Allergies: Morphine and related and Sulfa antibiotics    PHYSICAL EXAM: VS:  BP 132/80 mmHg  Pulse 76  Resp 16  Ht 6' (1.829 m)  Wt 171 lb 12.8 oz (77.928 kg)  BMI 23.30 kg/m2  SpO2 98% , Body mass index is 23.3 kg/(m^2). Wt Readings from Last 3 Encounters:  03/18/15 171 lb 12.8 oz (77.928 kg)  12/26/14 170 lb (77.111 kg)    GENERAL:  well developed, well nourished, not in acute distress HEENT: normocephalic, pink conjunctivae, anicteric sclerae, no xanthelasma, normal dentition, oropharynx clear NECK:  no neck vein engorgement, JVP normal, no hepatojugular reflux, carotid upstroke brisk and symmetric, no bruit, no thyromegaly, no lymphadenopathy LUNGS:  good respiratory effort, clear to auscultation bilaterally CV:  PMI not displaced, no thrills, no lifts, S1 and S2 within normal limits, no palpable S3 or S4, no murmurs, no rubs, no gallops ABD:  Soft, nontender, nondistended, normoactive bowel sounds, (+)abdominal aortic bruit, no hepatomegaly, no splenomegaly MS: nontender back, no kyphosis, no scoliosis, no joint deformities EXT:  2+ DP/PT pulses, no edema, no varicosities, no cyanosis, no clubbing SKIN: warm, nondiaphoretic, normal turgor, no ulcers NEUROPSYCH: alert, oriented to person,  place, and time, sensory/motor grossly intact, normal mood, appropriate affect  Recent Labs: 12/26/2014: BUN 26*; Creatinine, Ser 1.68*; Hemoglobin 13.0; Platelets 183; Potassium 3.3*; Sodium 136   Lipid Panel No results found for: CHOL, TRIG, HDL, CHOLHDL, VLDL, LDLCALC, LDLDIRECT   Other studies Reviewed:  EKG:  EKG is ordered today. The ekg ordered today was personally reviewed by me and it reveals sinus rhythm 76 BPM, with frequent PVCs. Left axis deviation. LVH. Septal infarct, similar to previous EKGs.  Additional studies/ records that were reviewed personally reviewed by me today include:  Echo: 03/25/2014 Dr. Juliann Pares: LVEF 40%. RV systolic function normal. Mild TR. Mild MR. Mild left atrial enlargement 4.2 cm.   ASSESSMENT AND PLAN:  1. Dizziness/diaphoresis - no orthostatic changes in vital signs. The symptoms may be related to  underlying CAD. Also may be related to arrhythmia, he has frequent PVCs on the EKG today.  2. CAD s/p CABG x 3, 1990 No ongoing CP. SOB reported.  Recommend to obtain report of cardiac catheterization done March 2016.  Depending on the results, further recommendations will be made. In the meantime, recommend continue medical therapy including aspirin, beta blocker, statin. Rec to resume ER 30 mg by mouth daily.  3. PVCs  Possibly related to dizziness. Rec 24 hr holter monitor for further evaluation. Rec cbc, cmp, tfts. rec echo.  Cont beta blocker for now.  4. AAA s/p repair. Bruit noted. Will await results of cmp and determine appropriate imaging modality.  5. Hypertension - BP is well controlled. Continue monitoring BP. Continue current medical therapy and lifestyle changes.  6. Hyperlipidemia - will order lipid panel on next visit, fasting study. Cont statin therapy for now.   Current medicines are reviewed at length with the patient today.  The patient does not have concerns regarding medicines.  Labs/ tests ordered today include:  Orders  Placed This Encounter  Procedures  . CBC With Differential/Platelet  . Comp Met (CMET)  . TSH  . T4, free  . Holter monitor - 24 hour  . EKG 12-Lead  . Echocardiogram    I had a lengthy and detailed discussion with the patient regarding diagnoses, prognosis, diagnostic options, treatment options, and side effects of medications.   I counseled the patient on importance of lifestyle modification including heart healthy die.  Disposition:   FU with undersigned in 1 week post tests.     Signed, Wende Bushy, MD  03/18/2015 11:24 AM    [place]

## 2015-03-19 ENCOUNTER — Other Ambulatory Visit: Payer: Self-pay | Admitting: Cardiology

## 2015-03-19 DIAGNOSIS — R61 Generalized hyperhidrosis: Secondary | ICD-10-CM

## 2015-03-19 DIAGNOSIS — R0609 Other forms of dyspnea: Secondary | ICD-10-CM

## 2015-03-19 DIAGNOSIS — I2510 Atherosclerotic heart disease of native coronary artery without angina pectoris: Secondary | ICD-10-CM

## 2015-03-19 DIAGNOSIS — R42 Dizziness and giddiness: Secondary | ICD-10-CM

## 2015-03-19 DIAGNOSIS — R002 Palpitations: Secondary | ICD-10-CM

## 2015-03-19 LAB — SPECIMEN STATUS

## 2015-03-19 LAB — CBC WITH DIFFERENTIAL/PLATELET
BASOS ABS: 0 10*3/uL (ref 0.0–0.2)
BASOS: 1 %
EOS (ABSOLUTE): 0.1 10*3/uL (ref 0.0–0.4)
Eos: 2 %
Hematocrit: 39.7 % (ref 37.5–51.0)
Hemoglobin: 13.3 g/dL (ref 12.6–17.7)
IMMATURE GRANS (ABS): 0 10*3/uL (ref 0.0–0.1)
IMMATURE GRANULOCYTES: 0 %
LYMPHS: 33 %
Lymphocytes Absolute: 1.5 10*3/uL (ref 0.7–3.1)
MCH: 29.5 pg (ref 26.6–33.0)
MCHC: 33.5 g/dL (ref 31.5–35.7)
MCV: 88 fL (ref 79–97)
MONOS ABS: 0.5 10*3/uL (ref 0.1–0.9)
Monocytes: 11 %
NEUTROS ABS: 2.5 10*3/uL (ref 1.4–7.0)
NEUTROS PCT: 53 %
PLATELETS: 161 10*3/uL (ref 150–379)
RBC: 4.51 x10E6/uL (ref 4.14–5.80)
RDW: 14.8 % (ref 12.3–15.4)
WBC: 4.6 10*3/uL (ref 3.4–10.8)

## 2015-03-19 LAB — SPECIMEN STATUS REPORT

## 2015-03-20 ENCOUNTER — Telehealth: Payer: Self-pay | Admitting: *Deleted

## 2015-03-23 NOTE — Telephone Encounter (Signed)
dup

## 2015-03-26 ENCOUNTER — Ambulatory Visit (INDEPENDENT_AMBULATORY_CARE_PROVIDER_SITE_OTHER): Payer: Medicare PPO

## 2015-03-26 ENCOUNTER — Other Ambulatory Visit: Payer: Self-pay

## 2015-03-26 DIAGNOSIS — I493 Ventricular premature depolarization: Secondary | ICD-10-CM

## 2015-03-26 DIAGNOSIS — R42 Dizziness and giddiness: Secondary | ICD-10-CM

## 2015-03-26 DIAGNOSIS — R0600 Dyspnea, unspecified: Secondary | ICD-10-CM

## 2015-03-26 DIAGNOSIS — R002 Palpitations: Secondary | ICD-10-CM

## 2015-03-26 DIAGNOSIS — R61 Generalized hyperhidrosis: Secondary | ICD-10-CM

## 2015-03-26 DIAGNOSIS — I2510 Atherosclerotic heart disease of native coronary artery without angina pectoris: Secondary | ICD-10-CM | POA: Diagnosis not present

## 2015-03-26 DIAGNOSIS — R0609 Other forms of dyspnea: Secondary | ICD-10-CM

## 2015-04-08 ENCOUNTER — Ambulatory Visit: Payer: Medicare PPO | Admitting: Cardiology

## 2015-04-10 ENCOUNTER — Ambulatory Visit: Payer: Medicare PPO | Admitting: Cardiovascular Disease

## 2015-04-17 ENCOUNTER — Other Ambulatory Visit: Payer: Self-pay

## 2015-04-17 DIAGNOSIS — R001 Bradycardia, unspecified: Secondary | ICD-10-CM

## 2015-04-17 MED ORDER — METOPROLOL TARTRATE 25 MG PO TABS: 25.0000 mg | ORAL_TABLET | Freq: Every day | ORAL | Status: DC

## 2015-04-20 ENCOUNTER — Telehealth: Payer: Self-pay | Admitting: *Deleted

## 2015-04-20 NOTE — Telephone Encounter (Signed)
All follow up appointments were canceled.

## 2015-04-22 ENCOUNTER — Encounter: Payer: Medicare PPO | Admitting: Cardiology

## 2015-04-23 ENCOUNTER — Institutional Professional Consult (permissible substitution): Payer: Medicare PPO | Admitting: Internal Medicine

## 2015-04-27 LAB — COMPREHENSIVE METABOLIC PANEL
A/G RATIO: 1.7 (ref 1.1–2.5)
ALBUMIN: 4.1 g/dL (ref 3.5–4.7)
ALK PHOS: 71 IU/L (ref 39–117)
ALT: 20 IU/L (ref 0–44)
AST: 26 IU/L (ref 0–40)
BILIRUBIN TOTAL: 0.6 mg/dL (ref 0.0–1.2)
BUN/Creatinine Ratio: 18 (ref 10–22)
BUN: 28 mg/dL — AB (ref 8–27)
CHLORIDE: 98 mmol/L (ref 96–106)
CO2: 26 mmol/L (ref 18–29)
Calcium: 9.4 mg/dL (ref 8.6–10.2)
Creatinine, Ser: 1.58 mg/dL — ABNORMAL HIGH (ref 0.76–1.27)
GFR calc Af Amer: 46 mL/min/{1.73_m2} — ABNORMAL LOW (ref >59)
GFR calc non Af Amer: 40 mL/min/{1.73_m2} — ABNORMAL LOW (ref >59)
GLOBULIN, TOTAL: 2.4 g/dL (ref 1.5–4.5)
Glucose: 125 mg/dL — ABNORMAL HIGH (ref 65–99)
POTASSIUM: 4.8 mmol/L (ref 3.5–5.2)
Sodium: 141 mmol/L (ref 134–144)
Total Protein: 6.5 g/dL (ref 6.0–8.5)

## 2015-04-27 LAB — T4, FREE: FREE T4: 1.05 ng/dL (ref 0.82–1.77)

## 2015-04-27 LAB — TSH: TSH: 1.6 u[IU]/mL (ref 0.450–4.500)

## 2015-04-28 ENCOUNTER — Ambulatory Visit: Payer: Medicare PPO | Admitting: Cardiology

## 2015-06-04 ENCOUNTER — Other Ambulatory Visit: Payer: Self-pay | Admitting: Internal Medicine

## 2015-06-04 DIAGNOSIS — I714 Abdominal aortic aneurysm, without rupture, unspecified: Secondary | ICD-10-CM

## 2015-06-08 ENCOUNTER — Other Ambulatory Visit: Payer: Self-pay | Admitting: Internal Medicine

## 2015-06-08 DIAGNOSIS — I714 Abdominal aortic aneurysm, without rupture, unspecified: Secondary | ICD-10-CM

## 2015-06-15 ENCOUNTER — Ambulatory Visit
Admission: RE | Admit: 2015-06-15 | Discharge: 2015-06-15 | Disposition: A | Discharge status: Home / Self Care | Payer: Medicare PPO | Source: Ambulatory Visit | Attending: Internal Medicine | Admitting: Internal Medicine

## 2015-06-15 DIAGNOSIS — I714 Abdominal aortic aneurysm, without rupture, unspecified: Secondary | ICD-10-CM

## 2015-06-15 DIAGNOSIS — I723 Aneurysm of iliac artery: Secondary | ICD-10-CM | POA: Insufficient documentation

## 2015-06-15 DIAGNOSIS — N289 Disorder of kidney and ureter, unspecified: Secondary | ICD-10-CM | POA: Insufficient documentation

## 2015-06-25 ENCOUNTER — Other Ambulatory Visit: Payer: Self-pay | Admitting: Vascular Surgery

## 2015-06-25 DIAGNOSIS — I714 Abdominal aortic aneurysm, without rupture, unspecified: Secondary | ICD-10-CM

## 2015-06-29 ENCOUNTER — Ambulatory Visit
Admission: RE | Admit: 2015-06-29 | Discharge: 2015-06-29 | Disposition: A | Discharge status: Home / Self Care | Payer: Medicare PPO | Source: Ambulatory Visit | Attending: Vascular Surgery | Admitting: Vascular Surgery

## 2015-06-29 DIAGNOSIS — I714 Abdominal aortic aneurysm, without rupture, unspecified: Secondary | ICD-10-CM

## 2015-06-29 DIAGNOSIS — N281 Cyst of kidney, acquired: Secondary | ICD-10-CM | POA: Diagnosis not present

## 2015-06-29 DIAGNOSIS — I70201 Unspecified atherosclerosis of native arteries of extremities, right leg: Secondary | ICD-10-CM | POA: Diagnosis not present

## 2015-06-29 DIAGNOSIS — K5730 Diverticulosis of large intestine without perforation or abscess without bleeding: Secondary | ICD-10-CM | POA: Insufficient documentation

## 2015-06-29 DIAGNOSIS — M5136 Other intervertebral disc degeneration, lumbar region: Secondary | ICD-10-CM | POA: Diagnosis not present

## 2015-06-29 DIAGNOSIS — N200 Calculus of kidney: Secondary | ICD-10-CM | POA: Diagnosis not present

## 2015-06-29 DIAGNOSIS — I745 Embolism and thrombosis of iliac artery: Secondary | ICD-10-CM | POA: Insufficient documentation

## 2015-06-29 MED ORDER — IOPAMIDOL (ISOVUE-370) INJECTION 76%
100.0000 mL | Freq: Once | INTRAVENOUS | Status: AC | PRN
  Administered 2015-06-29: 100 mL via INTRAVENOUS

## 2016-01-01 ENCOUNTER — Encounter (INDEPENDENT_AMBULATORY_CARE_PROVIDER_SITE_OTHER): Payer: Medicare PPO

## 2016-01-01 ENCOUNTER — Ambulatory Visit (INDEPENDENT_AMBULATORY_CARE_PROVIDER_SITE_OTHER): Payer: Self-pay | Admitting: Vascular Surgery

## 2016-01-12 ENCOUNTER — Emergency Department
Admission: EM | Admit: 2016-01-12 | Discharge: 2016-01-12 | Disposition: A | Discharge status: Home / Self Care | Payer: Medicare PPO | Attending: Emergency Medicine | Admitting: Emergency Medicine

## 2016-01-12 ENCOUNTER — Emergency Department: Payer: Medicare PPO

## 2016-01-12 DIAGNOSIS — Z79899 Other long term (current) drug therapy: Secondary | ICD-10-CM | POA: Insufficient documentation

## 2016-01-12 DIAGNOSIS — I252 Old myocardial infarction: Secondary | ICD-10-CM | POA: Insufficient documentation

## 2016-01-12 DIAGNOSIS — I25810 Atherosclerosis of coronary artery bypass graft(s) without angina pectoris: Secondary | ICD-10-CM | POA: Diagnosis not present

## 2016-01-12 DIAGNOSIS — Z87891 Personal history of nicotine dependence: Secondary | ICD-10-CM | POA: Insufficient documentation

## 2016-01-12 DIAGNOSIS — Z955 Presence of coronary angioplasty implant and graft: Secondary | ICD-10-CM | POA: Diagnosis not present

## 2016-01-12 DIAGNOSIS — E119 Type 2 diabetes mellitus without complications: Secondary | ICD-10-CM | POA: Diagnosis not present

## 2016-01-12 DIAGNOSIS — Z7982 Long term (current) use of aspirin: Secondary | ICD-10-CM | POA: Insufficient documentation

## 2016-01-12 DIAGNOSIS — R55 Syncope and collapse: Secondary | ICD-10-CM

## 2016-01-12 DIAGNOSIS — R109 Unspecified abdominal pain: Secondary | ICD-10-CM | POA: Diagnosis not present

## 2016-01-12 DIAGNOSIS — I10 Essential (primary) hypertension: Secondary | ICD-10-CM | POA: Diagnosis not present

## 2016-01-12 LAB — CBC
HCT: 39 % — ABNORMAL LOW (ref 40.0–52.0)
HEMOGLOBIN: 13.3 g/dL (ref 13.0–18.0)
MCH: 31.5 pg (ref 26.0–34.0)
MCHC: 34.1 g/dL (ref 32.0–36.0)
MCV: 92.3 fL (ref 80.0–100.0)
PLATELETS: 203 10*3/uL (ref 150–440)
RBC: 4.22 MIL/uL — ABNORMAL LOW (ref 4.40–5.90)
RDW: 14.3 % (ref 11.5–14.5)
WBC: 9.1 10*3/uL (ref 3.8–10.6)

## 2016-01-12 LAB — BASIC METABOLIC PANEL
ANION GAP: 11 (ref 5–15)
BUN: 33 mg/dL — AB (ref 6–20)
CALCIUM: 9.9 mg/dL (ref 8.9–10.3)
CO2: 28 mmol/L (ref 22–32)
CREATININE: 1.62 mg/dL — AB (ref 0.61–1.24)
Chloride: 101 mmol/L (ref 101–111)
GFR calc Af Amer: 44 mL/min — ABNORMAL LOW (ref >60)
GFR, EST NON AFRICAN AMERICAN: 38 mL/min — AB (ref >60)
GLUCOSE: 169 mg/dL — AB (ref 65–99)
Potassium: 3.7 mmol/L (ref 3.5–5.1)
Sodium: 140 mmol/L (ref 135–145)

## 2016-01-12 LAB — TROPONIN I
TROPONIN I: 0.03 ng/mL — AB (ref <0.03)
TROPONIN I: 0.03 ng/mL — AB (ref <0.03)

## 2016-01-12 NOTE — ED Triage Notes (Signed)
Pt arrives from home with reports of being outside standing and watching some men work and he was overcome by chest pain with shortness of breath and diaphoresis  Pt reports walking into the house and his face was pale and he felt faint  Family member called EMS and then family brought him here for evaluation

## 2016-01-12 NOTE — ED Provider Notes (Signed)
[facility] Emergency Department Provider Note  Time seen: 6:08 PM  I have reviewed the triage vital signs and the nursing notes.   HISTORY  Chief Complaint Chest Pain and Shortness of Breath    HPI [name] is a 82 y.o. male who presents to the emergency department with a near syncopal episode. 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 States he developed a stomachache like he needed to have diarrhea, he states he became lightheaded was able to get inside and sit down in a chair became somewhat nauseated and got sweaty. Patient states he was able to sit for 10 or 15 minutes and all the symptoms resolved. Patient denies any chest pain or trouble breathing at any point. Denies any abdominal pain besides the feeling that he needed to have a bowel movement, but he states that is also resolved. Denies any vomiting. Patient states this occurred around 2-3 PM, he has felt completely fine since that time. Denies any symptoms currently.  Past Medical History:  Diagnosis Date  . AAA (abdominal aortic aneurysm) ([place])   . AAA (abdominal aortic aneurysm) ([place])   . AAA (abdominal aortic aneurysm) ([place])   . AAA (abdominal aortic aneurysm) ([place])   . Diabetes mellitus without complication ([place])   . Hypertension   . Myocardial infarct     Patient Active Problem List   Diagnosis Date Noted  . Coronary artery disease involving native coronary artery of native heart without angina pectoris 03/18/2015  . Hyperlipidemia 03/18/2015  . PVC (premature ventricular contraction) 03/18/2015  . S/P CABG x 3 03/18/2015  . AAA (abdominal aortic aneurysm) ([place])   . Controlled type 2 diabetes mellitus without complication ([place]) 0000000  . Essential hypertension 11/28/2013  . Impaired renal function 11/28/2013    Past Surgical History:  Procedure Laterality Date  . ABDOMINAL AORTIC ANEURYSM REPAIR  2005, [facility]  . Cardiac bypass     . CARDIAC SURGERY      Prior to Admission medications   Medication Sig Start Date End Date Taking? Authorizing Provider  aspirin EC 81 MG tablet Take 81 mg by mouth daily.    Historical Provider, MD  atorvastatin (LIPITOR) 40 MG tablet Take 40 mg by mouth daily. 06/26/14   Historical Provider, MD  Cyanocobalamin (VITAMIN B 12 PO) Take 1 tablet by mouth daily. 12/10/14   Historical Provider, MD  glucose blood (BAYER CONTOUR TEST) test strip 1 strip by Does not apply route 3 (three) times daily. 03/14/14   Historical Provider, MD  isosorbide mononitrate (IMDUR) 30 MG 24 hr tablet Take 1 tablet (30 mg total) by mouth daily. 03/18/15   Wende Bushy, MD  lisinopril-hydrochlorothiazide (PRINZIDE,ZESTORETIC) 20-12.5 MG tablet Take 1 tablet by mouth daily. 02/26/15   Historical Provider, MD  Meclizine HCl 25 MG CHEW Chew 25 mg by mouth 3 (three) times daily as needed. As needed for dizziness 02/26/15   Historical Provider, MD  metoprolol tartrate (LOPRESSOR) 25 MG tablet Take 1 tablet (25 mg total) by mouth daily. 04/17/15 04/16/16  Wende Bushy, MD  Multiple Vitamins-Minerals (MULTIVITAMIN & MINERAL PO) Take 1 tablet by mouth daily. 01/05/15   Historical Provider, MD  Vitamin D, Cholecalciferol, 1000 units CAPS Take 1 tablet by mouth daily. 01/05/15   Historical Provider, MD  ZANTAC 150 MG tablet Take 1 tablet by mouth daily. 02/27/15   Historical Provider, MD    Allergies  Allergen Reactions  . Morphine And Related   .  Sulfa Antibiotics     Family History  Problem Relation Age of Onset  . Cerebral aneurysm Mother     died at 74  . CAD Father     died at 23    Social History Social History  Substance Use Topics  . Smoking status: Former Research scientist (life sciences)  . Smokeless tobacco: Never Used  . Alcohol use No    Review of Systems Constitutional: Negative for fever. Cardiovascular: Negative for chest pain. Respiratory: Negative for shortness of breath. Gastrointestinal: Negative for abdominal pain,  Positive for nausea, now resolved. Genitourinary: Negative for dysuria. Neurological: Negative for headache 10-point ROS otherwise negative.  ____________________________________________   PHYSICAL EXAM:  VITAL SIGNS: ED Triage Vitals  Enc Vitals Group     BP 01/12/16 1607 (!) 118/54     Pulse Rate 01/12/16 1607 77     Resp 01/12/16 1607 18     Temp 01/12/16 1607 97.9 F (36.6 C)     Temp Source 01/12/16 1607 Oral     SpO2 01/12/16 1607 98 %     Weight 01/12/16 1608 172 lb (78 kg)     Height 01/12/16 1608 6' (1.829 m)     Head Circumference --      Peak Flow --      Pain Score --      Pain Loc --      Pain Edu? --      Excl. in [place]? --     Constitutional: Alert and oriented. Well appearing and in no distress. Eyes: Normal exam ENT   Head: Normocephalic and atraumatic.   Mouth/Throat: Mucous membranes are moist. Cardiovascular: Normal rate, regular rhythm. No murmur Respiratory: Normal respiratory effort without tachypnea nor retractions. Breath sounds are clear  Gastrointestinal: Soft and nontender. No distention.   Musculoskeletal: Nontender with normal range of motion in all extremities. Neurologic:  Normal speech and language. No gross focal neurologic deficits Skin:  Skin is warm, dry and intact.  Psychiatric: Mood and affect are normal.  ____________________________________________    EKG  EKG reviewed and interpreted by myself shows normal sinus rhythm at 76 bpm, borderline widened QRS with a left axis deviation, largely normal intervals, nonspecific ST changes with no ST elevations.  ____________________________________________    RADIOLOGY  Chest x-ray shows no acute abnormality  ____________________________________________   INITIAL IMPRESSION / ASSESSMENT AND PLAN / ED COURSE  Pertinent labs & imaging results that were available during my care of the patient were reviewed by me and considered in my medical decision making (see chart for  details).  Patient presents the emergency department with acute near syncopal episode. Patient states this occurred when he developed a stomachache and felt like he needed to have a diarrhea bowel movement. Patient became nauseated lightheaded and sweaty, symptoms are consistent with a vasovagal episode. However given the patient's significant comorbidities and discussed with the patient the different options including admission to the hospital, patient strongly wishes to go home, states he feels normal. Patient was convinced to stay for a second troponin. If the patient's second troponin is normal. We'll be discharge with PCP follow-up.  Patient's repeat troponin is negative. He continues to feel well. We'll discharge from the emergency department I discussed with the patient return precautions for any further near syncopal episodes, any chest pain trouble breathing, sweatiness or nausea. Patient agreeable.  ____________________________________________   FINAL CLINICAL IMPRESSION(S) / ED DIAGNOSES  Near-syncope    Harvest Dark, MD 01/12/16 [phone]

## 2016-01-12 NOTE — ED Triage Notes (Signed)
Hx: triplr bypass in 1998  Procedure in 2005 for AAA repair

## 2016-01-12 NOTE — ED Notes (Signed)
Pt reports that for the last 3 days he has felt tired and like he needed to rest more frequently - today while at home pt became sweaty and short of breath - he got blurred vision and became dizzy - there was no warning before the symptoms started - pt took BP 66/47 CBG 90 HR 81 - pt took 2 NTG and then stated that after 10 minutes the symptoms subsided - he reports that now he is back to baseline for the last 3 days - reports that during episode he was nauseated and had severe abd pain and felt like he was going to have a bowel movement - pt had triple bypass in June 1990

## 2016-01-12 NOTE — Discharge Instructions (Signed)
You have been seen in the emergency department today for a near syncopal episode. Your workup has shown normal results. As we discussed please follow-up with your primary care physician in the next 1-2 days for recheck. Return to the emergency department for any chest pain, lightheadedness, trouble breathing, nausea, sweatiness, or any other symptom personally concerning to yourself.

## 2016-03-23 ENCOUNTER — Other Ambulatory Visit (INDEPENDENT_AMBULATORY_CARE_PROVIDER_SITE_OTHER): Payer: Self-pay | Admitting: Vascular Surgery

## 2016-03-23 DIAGNOSIS — I714 Abdominal aortic aneurysm, without rupture, unspecified: Secondary | ICD-10-CM

## 2016-03-23 DIAGNOSIS — I739 Peripheral vascular disease, unspecified: Secondary | ICD-10-CM

## 2016-03-23 DIAGNOSIS — Z9889 Other specified postprocedural states: Secondary | ICD-10-CM

## 2016-03-24 ENCOUNTER — Ambulatory Visit (INDEPENDENT_AMBULATORY_CARE_PROVIDER_SITE_OTHER): Payer: Medicare PPO

## 2016-03-24 ENCOUNTER — Ambulatory Visit (INDEPENDENT_AMBULATORY_CARE_PROVIDER_SITE_OTHER): Payer: Self-pay | Admitting: Vascular Surgery

## 2016-03-24 DIAGNOSIS — Z9889 Other specified postprocedural states: Secondary | ICD-10-CM

## 2016-03-24 DIAGNOSIS — I714 Abdominal aortic aneurysm, without rupture, unspecified: Secondary | ICD-10-CM

## 2016-03-24 DIAGNOSIS — I739 Peripheral vascular disease, unspecified: Secondary | ICD-10-CM | POA: Diagnosis not present

## 2016-03-30 ENCOUNTER — Encounter (INDEPENDENT_AMBULATORY_CARE_PROVIDER_SITE_OTHER): Payer: Self-pay | Admitting: Vascular Surgery

## 2016-03-31 ENCOUNTER — Telehealth (INDEPENDENT_AMBULATORY_CARE_PROVIDER_SITE_OTHER): Payer: Self-pay | Admitting: Vascular Surgery

## 2016-03-31 NOTE — Telephone Encounter (Signed)
Pt stated he was told to stop by office if he hadn't received a call about his results of [place] 3.1.18. I advised pt results would be called to him or mailed.

## 2016-04-05 ENCOUNTER — Ambulatory Visit (INDEPENDENT_AMBULATORY_CARE_PROVIDER_SITE_OTHER): Payer: Medicare PPO | Admitting: Vascular Surgery

## 2016-04-05 ENCOUNTER — Telehealth (INDEPENDENT_AMBULATORY_CARE_PROVIDER_SITE_OTHER): Payer: Self-pay

## 2016-04-05 ENCOUNTER — Other Ambulatory Visit (INDEPENDENT_AMBULATORY_CARE_PROVIDER_SITE_OTHER): Payer: Self-pay | Admitting: Vascular Surgery

## 2016-04-05 DIAGNOSIS — I714 Abdominal aortic aneurysm, without rupture, unspecified: Secondary | ICD-10-CM

## 2016-04-05 NOTE — Telephone Encounter (Signed)
Called patient and let him know we were calling in a referral for his CTA.

## 2016-05-20 ENCOUNTER — Encounter: Payer: Self-pay | Admitting: Emergency Medicine

## 2016-05-20 ENCOUNTER — Emergency Department
Admission: EM | Admit: 2016-05-20 | Discharge: 2016-05-21 | Disposition: A | Discharge status: Home / Self Care | Payer: Medicare PPO | Attending: Emergency Medicine | Admitting: Emergency Medicine

## 2016-05-20 DIAGNOSIS — Z87891 Personal history of nicotine dependence: Secondary | ICD-10-CM | POA: Diagnosis not present

## 2016-05-20 DIAGNOSIS — I10 Essential (primary) hypertension: Secondary | ICD-10-CM | POA: Diagnosis present

## 2016-05-20 DIAGNOSIS — I252 Old myocardial infarction: Secondary | ICD-10-CM | POA: Diagnosis not present

## 2016-05-20 DIAGNOSIS — Z79899 Other long term (current) drug therapy: Secondary | ICD-10-CM | POA: Insufficient documentation

## 2016-05-20 DIAGNOSIS — E119 Type 2 diabetes mellitus without complications: Secondary | ICD-10-CM | POA: Diagnosis not present

## 2016-05-20 DIAGNOSIS — Z7982 Long term (current) use of aspirin: Secondary | ICD-10-CM | POA: Diagnosis not present

## 2016-05-20 LAB — CBC WITH DIFFERENTIAL/PLATELET
BASOS ABS: 0 10*3/uL (ref 0–0.1)
Basophils Relative: 1 %
Eosinophils Absolute: 0.2 10*3/uL (ref 0–0.7)
Eosinophils Relative: 3 %
HEMATOCRIT: 42.2 % (ref 40.0–52.0)
HEMOGLOBIN: 14.1 g/dL (ref 13.0–18.0)
Lymphocytes Relative: 25 %
Lymphs Abs: 1.8 10*3/uL (ref 1.0–3.6)
MCH: 31 pg (ref 26.0–34.0)
MCHC: 33.4 g/dL (ref 32.0–36.0)
MCV: 92.7 fL (ref 80.0–100.0)
MONOS PCT: 9 %
Monocytes Absolute: 0.7 10*3/uL (ref 0.2–1.0)
NEUTROS ABS: 4.5 10*3/uL (ref 1.4–6.5)
NEUTROS PCT: 62 %
Platelets: 191 10*3/uL (ref 150–440)
RBC: 4.55 MIL/uL (ref 4.40–5.90)
RDW: 13.9 % (ref 11.5–14.5)
WBC: 7.2 10*3/uL (ref 3.8–10.6)

## 2016-05-20 LAB — BASIC METABOLIC PANEL
ANION GAP: 9 (ref 5–15)
BUN: 24 mg/dL — ABNORMAL HIGH (ref 6–20)
CHLORIDE: 101 mmol/L (ref 101–111)
CO2: 30 mmol/L (ref 22–32)
Calcium: 10.1 mg/dL (ref 8.9–10.3)
Creatinine, Ser: 1.6 mg/dL — ABNORMAL HIGH (ref 0.61–1.24)
GFR calc Af Amer: 44 mL/min — ABNORMAL LOW (ref >60)
GFR calc non Af Amer: 38 mL/min — ABNORMAL LOW (ref >60)
Glucose, Bld: 137 mg/dL — ABNORMAL HIGH (ref 65–99)
POTASSIUM: 3.3 mmol/L — AB (ref 3.5–5.1)
Sodium: 140 mmol/L (ref 135–145)

## 2016-05-20 LAB — TROPONIN I: Troponin I: 0.03 ng/mL (ref <0.03)

## 2016-05-20 NOTE — ED Provider Notes (Signed)
[facility] Emergency Department Provider Note   ____________________________________________   First MD Initiated Contact with Patient 05/20/16 2311     (approximate)  I have reviewed the triage vital signs and the nursing notes.   HISTORY  Chief Complaint Hypertension      HPI [name] is a 83 y.o. male patient is in his usual state of health feeling well when he took his blood pressure and found that it was very high over 200. He's taken it several times it's gone up and down the course of the evening he took a half of one of his Zestoretic pills but his blood pressure remains above 200 here in the emergency room now. Patient still feels fine he has no headache chest pain shortness of breath blurry vision or any other complaints.   Past Medical History:  Diagnosis Date  . AAA (abdominal aortic aneurysm) ([place])   . AAA (abdominal aortic aneurysm) ([place])   . AAA (abdominal aortic aneurysm) ([place])   . AAA (abdominal aortic aneurysm) ([place])   . Diabetes mellitus without complication ([place])   . Hypertension   . Myocardial infarct [facility])     Patient Active Problem List   Diagnosis Date Noted  . Coronary artery disease involving native coronary artery of native heart without angina pectoris 03/18/2015  . Hyperlipidemia 03/18/2015  . PVC (premature ventricular contraction) 03/18/2015  . S/P CABG x 3 03/18/2015  . AAA (abdominal aortic aneurysm) ([place])   . Controlled type 2 diabetes mellitus without complication ([place]) 10/16/3005  . Essential hypertension 11/28/2013  . Impaired renal function 11/28/2013    Past Surgical History:  Procedure Laterality Date  . ABDOMINAL AORTIC ANEURYSM REPAIR  2005, [facility]  . Cardiac bypass    . CARDIAC SURGERY      Prior to Admission medications   Medication Sig Start Date End Date Taking? Authorizing Provider  aspirin EC 81 MG tablet Take 81 mg by mouth daily.    Historical Provider, MD  atorvastatin (LIPITOR) 40  MG tablet Take 40 mg by mouth daily. 06/26/14   Historical Provider, MD  Cyanocobalamin (VITAMIN B 12 PO) Take 1 tablet by mouth daily. 12/10/14   Historical Provider, MD  glucose blood (BAYER CONTOUR TEST) test strip 1 strip by Does not apply route 3 (three) times daily. 03/14/14   Historical Provider, MD  isosorbide mononitrate (IMDUR) 30 MG 24 hr tablet Take 1 tablet (30 mg total) by mouth daily. 03/18/15   Wende Bushy, MD  lisinopril-hydrochlorothiazide (PRINZIDE,ZESTORETIC) 20-12.5 MG tablet Take 1 tablet by mouth daily. 02/26/15   Historical Provider, MD  Meclizine HCl 25 MG CHEW Chew 25 mg by mouth 3 (three) times daily as needed. As needed for dizziness 02/26/15   Historical Provider, MD  metoprolol tartrate (LOPRESSOR) 25 MG tablet Take 1 tablet (25 mg total) by mouth daily. 04/17/15 04/16/16  Wende Bushy, MD  Multiple Vitamins-Minerals (MULTIVITAMIN & MINERAL PO) Take 1 tablet by mouth daily. 01/05/15   Historical Provider, MD  Vitamin D, Cholecalciferol, 1000 units CAPS Take 1 tablet by mouth daily. 01/05/15   Historical Provider, MD  ZANTAC 150 MG tablet Take 1 tablet by mouth daily. 02/27/15   Historical Provider, MD    Allergies Morphine and related and Sulfa antibiotics  Family History  Problem Relation Age of Onset  . Cerebral aneurysm Mother     died at 52  . CAD Father     died at 73    Social History Social History  Substance Use Topics  . Smoking status: Former Research scientist (life sciences)  . Smokeless tobacco: Never Used  . Alcohol use No    Review of Systems  Constitutional: No fever/chills Eyes: No visual changes. ENT: No sore throat. Cardiovascular: Denies chest pain. Respiratory: Denies shortness of breath. Gastrointestinal: No abdominal pain.  No nausea, no vomiting.  No diarrhea.  No constipation. Genitourinary: Negative for dysuria. Musculoskeletal: Negative for back pain. Skin: Negative for rash. Neurological: Negative for headaches, focal weakness or  numbness.   ____________________________________________   PHYSICAL EXAM:  VITAL SIGNS: ED Triage Vitals  Enc Vitals Group     BP 05/20/16 2107 (!) 201/92     Pulse Rate 05/20/16 2107 67     Resp 05/20/16 2107 18     Temp 05/20/16 2107 98.8 F (37.1 C)     Temp Source 05/20/16 2107 Oral     SpO2 05/20/16 2107 97 %     Weight 05/20/16 2108 172 lb (78 kg)     Height 05/20/16 2108 6' (1.829 m)     Head Circumference --      Peak Flow --      Pain Score 05/20/16 2107 0     Pain Loc --      Pain Edu? --      Excl. in [place]? --     Constitutional: Alert and oriented. Well appearing and in no acute distress. Eyes: Conjunctivae are normal. PERRL. EOMI.Fundi look normal Head: Atraumatic. Nose: No congestion/rhinnorhea. Mouth/Throat: Mucous membranes are moist.  Oropharynx non-erythematous. Neck: No stridor.   Cardiovascular: Normal rate, regular rhythm. Grossly normal heart sounds.  Good peripheral circulation. Respiratory: Normal respiratory effort.  No retractions. Lungs CTAB. Gastrointestinal: Soft and nontender. No distention. No abdominal bruits. No CVA tenderness. Musculoskeletal: No lower extremity tenderness nor edema.  No joint effusions. Neurologic:  Normal speech and language. No gross focal neurologic deficits are appreciated. No gait instability. Skin:  Skin is warm, dry and intact. No rash noted. Psychiatric: Mood and affect are normal. Speech and behavior are normal.  ____________________________________________   LABS (all labs ordered are listed, but only abnormal results are displayed)  Labs Reviewed  BASIC METABOLIC PANEL - Abnormal; Notable for the following:       Result Value   Potassium 3.3 (*)    Glucose, Bld 137 (*)    BUN 24 (*)    Creatinine, Ser 1.60 (*)    GFR calc non Af Amer 38 (*)    GFR calc Af Amer 44 (*)    All other components within normal limits  TROPONIN I - Abnormal; Notable for the following:    Troponin I 0.03 (*)    All other  components within normal limits  TROPONIN I - Abnormal; Notable for the following:    Troponin I 0.03 (*)    All other components within normal limits  CBC WITH DIFFERENTIAL/PLATELET   ____________________________________________  EKG  EKG read and interpreted by me shows normal sinus rhythm rate of 63 left axis no acute ST-T wave changes looks similar to prior EKGs ____________________________________________  RADIOLOGY   ____________________________________________   PROCEDURES  Procedure(s) performed:   Procedures  Critical Care performed:   ____________________________________________   INITIAL IMPRESSION / ASSESSMENT AND PLAN / ED COURSE  Pertinent labs & imaging results that were available during my care of the patient were reviewed by me and considered in my medical decision making (see chart for details).   Second troponin is unchanged patient feels well we'll discharge  him        ____________________________________________   FINAL CLINICAL IMPRESSION(S) / ED DIAGNOSES  Final diagnoses:  Essential hypertension      NEW MEDICATIONS STARTED DURING THIS VISIT:  Discharge Medication List as of 05/21/2016  2:02 AM       Note:  This document was prepared using Dragon voice recognition software and may include unintentional dictation errors.    Nena Polio, MD 05/21/16 [phone]

## 2016-05-20 NOTE — ED Triage Notes (Signed)
Pt arrives POV to triage with c/o HTN. Pt reports BP of 226/124 at home and took "half of a BP pill" around 1800. Pt denies cardiac symptoms at this time and is in NAD at this time.

## 2016-05-21 LAB — TROPONIN I: TROPONIN I: 0.03 ng/mL — AB (ref <0.03)

## 2016-05-21 MED ORDER — LISINOPRIL 10 MG PO TABS: 20.0000 mg | ORAL_TABLET | Freq: Once | ORAL | Status: DC

## 2016-05-21 MED ORDER — HYDROCHLOROTHIAZIDE 12.5 MG PO CAPS: 12.5000 mg | ORAL_CAPSULE | ORAL | Status: DC

## 2016-05-21 NOTE — Discharge Instructions (Signed)
Please increase your lisinopril/hydrochlorothiazide to 1 pill twice a day. See your doctor tomorrow to recheck your blood pressure and confirm that he is okay with this increased dosage of blood pressure medicine. Remember with the increased dose of blood pressure medicine he might be lightheaded when you get out of bed sit up on the side of the bed for a few minutes and then stand up holding onto something so that she doesn't get lightheaded and fall over. Please return for any severe headache nausea vomiting chest pain or other problems.

## 2016-05-21 NOTE — ED Notes (Signed)
Pt reports working earlier today and cutting self accidentally and bleeding profusely, pt reports episodes of "spells" where HTN is uncontrolled and tonight was different; pt reports breaking a pill (treatment for HTN) and taking a pill

## 2016-05-23 DIAGNOSIS — G459 Transient cerebral ischemic attack, unspecified: Secondary | ICD-10-CM | POA: Insufficient documentation

## 2016-05-23 DIAGNOSIS — I4891 Unspecified atrial fibrillation: Secondary | ICD-10-CM | POA: Insufficient documentation

## 2016-06-30 DIAGNOSIS — Z85828 Personal history of other malignant neoplasm of skin: Secondary | ICD-10-CM

## 2016-06-30 HISTORY — DX: Personal history of other malignant neoplasm of skin: Z85.828

## 2016-08-16 DIAGNOSIS — Z86006 Personal history of melanoma in-situ: Secondary | ICD-10-CM

## 2016-08-16 HISTORY — DX: Personal history of melanoma in-situ: Z86.006

## 2016-09-23 ENCOUNTER — Encounter (INDEPENDENT_AMBULATORY_CARE_PROVIDER_SITE_OTHER): Payer: Medicare PPO

## 2016-09-23 ENCOUNTER — Ambulatory Visit (INDEPENDENT_AMBULATORY_CARE_PROVIDER_SITE_OTHER): Payer: Medicare PPO | Admitting: Vascular Surgery

## 2017-04-10 ENCOUNTER — Other Ambulatory Visit (INDEPENDENT_AMBULATORY_CARE_PROVIDER_SITE_OTHER): Payer: Self-pay | Admitting: Vascular Surgery

## 2017-04-10 NOTE — Progress Notes (Unsigned)
cta

## 2017-04-12 ENCOUNTER — Telehealth (INDEPENDENT_AMBULATORY_CARE_PROVIDER_SITE_OTHER): Payer: Self-pay | Admitting: Vascular Surgery

## 2017-04-12 ENCOUNTER — Other Ambulatory Visit (INDEPENDENT_AMBULATORY_CARE_PROVIDER_SITE_OTHER): Payer: Self-pay | Admitting: Vascular Surgery

## 2017-04-12 NOTE — Telephone Encounter (Signed)
Received notification through Epic that the patient never underwent the CT abdomen and pelvis I ordered for him a year ago.  Our office will reach out to the patient and have him follow-up to need to be seen.

## 2017-04-12 NOTE — Telephone Encounter (Signed)
Called to schedule aorta/iliac and bl Le arterial and fu w/Dew per a message from [place]. Patient said he doing good and didnt want to schedule.

## 2017-05-29 ENCOUNTER — Ambulatory Visit: Payer: Medicare PPO

## 2017-05-29 ENCOUNTER — Other Ambulatory Visit: Payer: Self-pay | Admitting: Physician Assistant

## 2017-05-29 DIAGNOSIS — I714 Abdominal aortic aneurysm, without rupture, unspecified: Secondary | ICD-10-CM

## 2017-05-30 ENCOUNTER — Ambulatory Visit: Payer: Medicare PPO

## 2017-05-31 ENCOUNTER — Other Ambulatory Visit: Payer: Self-pay | Admitting: Physician Assistant

## 2017-05-31 ENCOUNTER — Ambulatory Visit
Admission: RE | Admit: 2017-05-31 | Discharge: 2017-05-31 | Disposition: A | Discharge status: Home / Self Care | Payer: Medicare PPO | Source: Ambulatory Visit | Attending: Physician Assistant | Admitting: Physician Assistant

## 2017-05-31 DIAGNOSIS — I714 Abdominal aortic aneurysm, without rupture, unspecified: Secondary | ICD-10-CM

## 2017-06-01 ENCOUNTER — Other Ambulatory Visit: Payer: Medicare PPO

## 2017-06-15 ENCOUNTER — Encounter: Payer: Self-pay | Admitting: *Deleted

## 2017-07-18 ENCOUNTER — Ambulatory Visit: Payer: Medicare PPO | Admitting: General Surgery

## 2017-07-18 ENCOUNTER — Encounter: Payer: Self-pay | Admitting: General Surgery

## 2017-07-18 VITALS — BP 162/88 | HR 95 | Resp 12 | Ht 70.0 in | Wt 154.0 lb

## 2017-07-18 DIAGNOSIS — K4090 Unilateral inguinal hernia, without obstruction or gangrene, not specified as recurrent: Secondary | ICD-10-CM | POA: Insufficient documentation

## 2017-07-18 NOTE — Patient Instructions (Signed)

## 2017-07-18 NOTE — Progress Notes (Signed)
Patient ID: [name], male   DOB: [birth year], 84 y.o.   MRN: [id number]  Chief Complaint  Patient presents with  . Hernia    HPI [name] is a 84 y.o. male here today for a evalaution of a left inguinal hernia referred by Dr Wynetta Emery. Patient noticed a knot May 5, left groin and burning pain.  He states it can be as large as an egg or larger. He can push the knot back in. He states he is also having back pain as well, sciatic nerve trouble. He states he doesn't have an appetite and has lost weight, 18 pounds in 2 months. Bowels moving regular. He is taking tylenol for the pain/discomfort. He is going to the chiropractor to see if that would help the sciatic pain.  Blood sugars have been 95-140. He lives with his dog, a poodle, his wife passed away 2 years ago.  HPI  Past Medical History:  Diagnosis Date  . AAA (abdominal aortic aneurysm) ([place])   . Diabetes mellitus without complication ([place])   . Hypertension   . Myocardial infarct [facility])     Past Surgical History:  Procedure Laterality Date  . ABDOMINAL AORTIC ANEURYSM REPAIR  2005, [facility]  . Cardiac bypass  1990   [facility]  . [place]  . CHOLECYSTECTOMY     Dr Bary Castilla    Family History  Problem Relation Age of Onset  . Cerebral aneurysm Mother        died at 79  . CAD Father        died at 70    Social History Social History   Tobacco Use  . Smoking status: Former Smoker    Years: 30.00    Types: Cigarettes    Last attempt to quit: 01/25/1988    Years since quitting: 29.4  . Smokeless tobacco: Never Used  Substance Use Topics  . Alcohol use: No  . Drug use: No    Allergies  Allergen Reactions  . Morphine And Related   . Sulfa Antibiotics     Current Outpatient Medications  Medication Sig Dispense Refill  . atorvastatin (LIPITOR) 40 MG tablet Take 40 mg by mouth daily.    . Cyanocobalamin (VITAMIN B 12 PO) Take 1 tablet by mouth daily.    Marland Kitchen diltiazem (CARDIZEM CD) 120 MG 24 hr  capsule TAKE ONE CAPSULE BY MOUTH TWICE A DAY    . ELIQUIS 5 MG TABS tablet Take 5 mg by mouth 2 (two) times daily.     . Ergocalciferol (VITAMIN D2 PO) Take by mouth daily.    Marland Kitchen glucose blood (BAYER CONTOUR TEST) test strip 1 strip by Does not apply route 3 (three) times daily.    Marland Kitchen lisinopril-hydrochlorothiazide (PRINZIDE,ZESTORETIC) 20-12.5 MG tablet Take 2 tablets by mouth daily.     . Omega-3 Fatty Acids (FISH OIL) 1000 MG CAPS Take by mouth daily.    . Vitamin D, Cholecalciferol, 1000 units CAPS Take 1 tablet by mouth daily.     No current facility-administered medications for this visit.     Review of Systems Review of Systems  Constitutional: Negative.   Respiratory: Negative.   Cardiovascular: Negative.     Blood pressure (!) 162/88, pulse 95, resp. rate 12, height 5\' 10"  (1.778 m), weight 154 lb (69.9 kg), SpO2 98 %.  Physical Exam Physical Exam  Constitutional: He is oriented to person, place, and time. He appears well-developed and well-nourished.  HENT:  Mouth/Throat: No oropharyngeal exudate.  Eyes: Conjunctivae are normal. No scleral icterus.  Neck: Neck supple.  Cardiovascular: Normal rate, regular rhythm and normal heart sounds.  Pulmonary/Chest: Effort normal and breath sounds normal.  Abdominal: Soft. Normal appearance and bowel sounds are normal. A hernia is present. Hernia confirmed positive in the left inguinal area.    Reducible left inguinal hernia.  Neurological: He is alert and oriented to person, place, and time.  Skin: Skin is warm and dry.  Psychiatric: His behavior is normal.    Data Reviewed Jun 11, 2017 laboratory studies completed at the [facility] clinic showed a blood sugar of 127, normal electrolytes, creatinine 1.5 with an estimated GFR of 44, normal liver function studies.  Hemoglobin of 13.8 with an MCV of 93, white blood cell count of 12,600 with 64% neutrophils, 20% lymphocytes.  Platelet count of 210,000.  Normal lipid  studies.  Assessment    Symptomatic left inguinal hernia.    Plan  Indications for elective repair were reviewed.  We will coordinate surgery with his sister's ability to travel to be his chauffeur.  His sister is coming from [place] to help him after surgery.  Hernia precautions and incarceration were discussed with the patient. If they develop symptoms of an incarcerated hernia, they were encouraged to seek prompt medical attention.  I have recommended repair of the hernia using mesh on an outpatient basis in the near future. The risk of infection was reviewed. The role of prosthetic mesh to minimize the risk of recurrence was reviewed.    HPI, Physical Exam, Assessment and Plan have been scribed under the direction and in the presence of Robert Bellow, MD. Karie Fetch, RN  I have completed the exam and reviewed the above documentation for accuracy and completeness.  I agree with the above.  Haematologist has been used and any errors in dictation or transcription are unintentional.  Hervey Ard, M.D., F.A.C.S.  [name] 07/18/2017, 9:24 PM

## 2017-07-19 ENCOUNTER — Encounter: Payer: Self-pay | Admitting: General Surgery

## 2017-07-19 ENCOUNTER — Telehealth: Payer: Self-pay | Admitting: General Surgery

## 2017-07-19 ENCOUNTER — Other Ambulatory Visit: Payer: Self-pay | Admitting: General Surgery

## 2017-07-19 DIAGNOSIS — K4090 Unilateral inguinal hernia, without obstruction or gangrene, not specified as recurrent: Secondary | ICD-10-CM

## 2017-07-19 NOTE — Telephone Encounter (Signed)
Please call Ata Pecha patient's sister from [place]. She would like to schedule his surgery . Please call her at [phone].

## 2017-07-19 NOTE — Telephone Encounter (Signed)
Spoke with patient's sister, Thayer Headings, and she said that the 15th of July would work best for both of them as she would be in town that week.  The patient is scheduled for surgery at [facility] on 08/07/17. He will pre admit at the hospital. We will obtain Cardiac Clearance from Dr Clayborn Bigness. The patient is aware of date and instructions.

## 2017-07-28 ENCOUNTER — Other Ambulatory Visit: Payer: Self-pay

## 2017-07-28 ENCOUNTER — Encounter
Admission: RE | Admit: 2017-07-28 | Discharge: 2017-07-28 | Disposition: A | Discharge status: Home / Self Care | Payer: Medicare PPO | Source: Ambulatory Visit | Attending: General Surgery | Admitting: General Surgery

## 2017-07-28 DIAGNOSIS — Z01818 Encounter for other preprocedural examination: Secondary | ICD-10-CM | POA: Insufficient documentation

## 2017-07-28 HISTORY — DX: Nonrheumatic mitral (valve) insufficiency: I34.0

## 2017-07-28 HISTORY — DX: Personal history of urinary calculi: Z87.442

## 2017-07-28 HISTORY — DX: Cardiac arrhythmia, unspecified: I49.9

## 2017-07-28 HISTORY — DX: Malignant (primary) neoplasm, unspecified: C80.1

## 2017-07-28 HISTORY — DX: Personal history of other medical treatment: Z92.89

## 2017-07-28 HISTORY — DX: Unspecified osteoarthritis, unspecified site: M19.90

## 2017-07-28 NOTE — Patient Instructions (Addendum)
Your procedure is scheduled on: Monday, August 07, 2017  Report to [facility]    DO NOT STOP ON THE FIRST FLOOR TO REGISTER  To find out your arrival time please call [phone] between 1PM - 3PM on Friday August 04, 2017   Remember: Instructions that are not followed completely may result in serious medical risk,  up to and including death, or upon the discretion of your surgeon and anesthesiologist your  surgery may need to be rescheduled.     _X__ 1. Do not eat food after midnight the night before your procedure.                 No gum chewing or hard candies.NOTHING SOLID IN YOUR MOUTH AFTER MIDNIGHT                  You may drink clear liquids up to 2 hours before you are scheduled to arrive for your surgery-                   DO not drink clear liquids within 2 hours of the start of your surgery.                  Clear Liquids include:  water, apple juice without pulp, clear carbohydrate                 drink such as Clearfast of Gatorade, Black Coffee or Tea (Do not add                 anything to coffee or tea).  __X__2.  On the morning of surgery brush your teeth with toothpaste and water, you                may rinse your mouth with mouthwash if you wish.                  Do not swallow any toothpaste of mouthwash.     _X__ 3.  No Alcohol for 24 hours before or after surgery.   _X__ 4.  Do Not Smoke or use e-cigarettes For 24 Hours Prior to Your Surgery.                 Do not use any chewable tobacco products for at least 6 hours prior to                 surgery.  ____  5.  Bring all medications with you on the day of surgery if instructed.   _X___  6.  Notify your doctor if there is any change in your medical condition      (cold, fever, infections).     Do not wear jewelry, make-up, hairpins, clips or nail polish. Do not wear lotions, powders, or perfumes. You may wear deodorant. Do not shave 48 hours prior to  surgery. Men may shave face and neck. Do not bring valuables to the hospital.    [facility] is not responsible for any belongings or valuables.  Contacts, dentures or bridgework may not be worn into surgery. Leave your suitcase in the car. After surgery it may be brought to your room. For patients admitted to the hospital, discharge time is determined by your treatment team.   Patients discharged the day of surgery will not be allowed to drive home.   Please read over the following fact sheets that you were given:   PREPARING FOR SURGERY  ____ Take these medicines the morning of surgery with A SIP OF WATER:    1.  LIPITOR  2.  DILTIAZEM  3.   4.  5.  6.  ____ Fleet Enema (as directed)   __X__ Use CHG Soap as directed  ____ Use inhalers on the day of surgery  ____ Stop metformin 2 days prior to surgery    _X___ Stop ELIQUIS AS OF July 10TH,2019 (PER DR. CALLWOOD)               NO ASPIRIN PRODUCTS  _X___ Stop Anti-inflammatories AS OF TODAY   __X__ Stop supplements until after surgery.               STOP ON Monday, July 8TH :  FISH OIL / VITAMIN D  ____ Bring C-Pap to the hospital.   CONTINUE TAKING LISINOPRIL - HYDROCHLOROTHIAZIDE UP UNTIL SURGERY     BUT DO NOT TAKE ON THE MORNING OF SURGERY  YOU MAY CONTINUE TO TAKE VITAMIN B12 BUT DO NOT TAKE THAT ON THE     MORNING OF SURGERY.  HAVE STOOL SOFTENERS AT HOME TO USE AFTER SURGERY. HAVE A FIRM PILLOW FOR ABDOMINAL SUPPORT.  WEAR LOOSE AND COMFORTABLE CLOTHING TO THE HOSPITAL.

## 2017-08-07 ENCOUNTER — Ambulatory Visit: Payer: Medicare PPO | Admitting: Anesthesiology

## 2017-08-07 ENCOUNTER — Ambulatory Visit
Admission: RE | Admit: 2017-08-07 | Discharge: 2017-08-07 | Disposition: A | Discharge status: Home / Self Care | Payer: Medicare PPO | Source: Ambulatory Visit | Attending: General Surgery | Admitting: General Surgery

## 2017-08-07 ENCOUNTER — Encounter
Admission: RE | Disposition: A | Discharge status: Home / Self Care | Payer: Self-pay | Source: Ambulatory Visit | Attending: General Surgery

## 2017-08-07 ENCOUNTER — Other Ambulatory Visit: Payer: Self-pay

## 2017-08-07 DIAGNOSIS — I2510 Atherosclerotic heart disease of native coronary artery without angina pectoris: Secondary | ICD-10-CM | POA: Diagnosis not present

## 2017-08-07 DIAGNOSIS — I714 Abdominal aortic aneurysm, without rupture: Secondary | ICD-10-CM | POA: Insufficient documentation

## 2017-08-07 DIAGNOSIS — I252 Old myocardial infarction: Secondary | ICD-10-CM | POA: Insufficient documentation

## 2017-08-07 DIAGNOSIS — E119 Type 2 diabetes mellitus without complications: Secondary | ICD-10-CM | POA: Insufficient documentation

## 2017-08-07 DIAGNOSIS — I10 Essential (primary) hypertension: Secondary | ICD-10-CM | POA: Diagnosis not present

## 2017-08-07 DIAGNOSIS — K4090 Unilateral inguinal hernia, without obstruction or gangrene, not specified as recurrent: Secondary | ICD-10-CM | POA: Insufficient documentation

## 2017-08-07 DIAGNOSIS — Z87891 Personal history of nicotine dependence: Secondary | ICD-10-CM | POA: Diagnosis not present

## 2017-08-07 DIAGNOSIS — D176 Benign lipomatous neoplasm of spermatic cord: Secondary | ICD-10-CM | POA: Insufficient documentation

## 2017-08-07 DIAGNOSIS — Z7902 Long term (current) use of antithrombotics/antiplatelets: Secondary | ICD-10-CM | POA: Diagnosis not present

## 2017-08-07 DIAGNOSIS — Z79899 Other long term (current) drug therapy: Secondary | ICD-10-CM | POA: Insufficient documentation

## 2017-08-07 HISTORY — PX: INGUINAL HERNIA REPAIR: SHX194

## 2017-08-07 LAB — GLUCOSE, CAPILLARY
GLUCOSE-CAPILLARY: 118 mg/dL — AB (ref 70–99)
Glucose-Capillary: 140 mg/dL — ABNORMAL HIGH (ref 70–99)

## 2017-08-07 SURGERY — REPAIR, HERNIA, INGUINAL, ADULT
Anesthesia: General | Laterality: Left | Wound class: Clean

## 2017-08-07 MED ORDER — FENTANYL CITRATE (PF) 100 MCG/2ML IJ SOLN
INTRAMUSCULAR | Status: DC | PRN
  Administered 2017-08-07 (×2): 25 ug via INTRAVENOUS

## 2017-08-07 MED ORDER — KETOROLAC TROMETHAMINE 30 MG/ML IJ SOLN
INTRAMUSCULAR | Status: DC | PRN
  Administered 2017-08-07: 15 mg via INTRAVENOUS

## 2017-08-07 MED ORDER — LIDOCAINE HCL (CARDIAC) PF 100 MG/5ML IV SOSY
PREFILLED_SYRINGE | INTRAVENOUS | Status: DC | PRN
  Administered 2017-08-07: 80 mg via INTRAVENOUS

## 2017-08-07 MED ORDER — BACITRACIN ZINC 500 UNIT/GM EX OINT
TOPICAL_OINTMENT | CUTANEOUS | Status: AC
  Filled 2017-08-07: qty 28.35

## 2017-08-07 MED ORDER — GLYCOPYRROLATE 0.2 MG/ML IJ SOLN
INTRAMUSCULAR | Status: DC | PRN
  Administered 2017-08-07: 0.2 mg via INTRAVENOUS

## 2017-08-07 MED ORDER — BUPIVACAINE-EPINEPHRINE (PF) 0.5% -1:200000 IJ SOLN
INTRAMUSCULAR | Status: DC | PRN
  Administered 2017-08-07: 20 mL via PERINEURAL
  Administered 2017-08-07: 10 mL via PERINEURAL

## 2017-08-07 MED ORDER — ELIQUIS 5 MG PO TABS: 5.0000 mg | ORAL_TABLET | Freq: Two times a day (BID) | ORAL | 0 refills | Status: AC

## 2017-08-07 MED ORDER — CEFAZOLIN SODIUM-DEXTROSE 2-4 GM/100ML-% IV SOLN
INTRAVENOUS | Status: AC
  Filled 2017-08-07: qty 100

## 2017-08-07 MED ORDER — SUCCINYLCHOLINE CHLORIDE 20 MG/ML IJ SOLN
INTRAMUSCULAR | Status: AC
  Filled 2017-08-07: qty 1

## 2017-08-07 MED ORDER — MEPERIDINE HCL 50 MG/ML IJ SOLN: 6.2500 mg | INTRAMUSCULAR | Status: DC | PRN

## 2017-08-07 MED ORDER — ACETAMINOPHEN 325 MG PO TABS: 325.0000 mg | ORAL_TABLET | ORAL | Status: DC | PRN

## 2017-08-07 MED ORDER — ACETAMINOPHEN 10 MG/ML IV SOLN
INTRAVENOUS | Status: AC
  Filled 2017-08-07: qty 100

## 2017-08-07 MED ORDER — PHENYLEPHRINE HCL 10 MG/ML IJ SOLN
INTRAMUSCULAR | Status: AC
  Filled 2017-08-07: qty 1

## 2017-08-07 MED ORDER — GLYCOPYRROLATE 0.2 MG/ML IJ SOLN
INTRAMUSCULAR | Status: AC
  Filled 2017-08-07: qty 1

## 2017-08-07 MED ORDER — ACETAMINOPHEN 10 MG/ML IV SOLN
INTRAVENOUS | Status: DC | PRN
  Administered 2017-08-07: 1000 mg via INTRAVENOUS

## 2017-08-07 MED ORDER — EPHEDRINE SULFATE 50 MG/ML IJ SOLN
INTRAMUSCULAR | Status: AC
  Filled 2017-08-07: qty 1

## 2017-08-07 MED ORDER — ONDANSETRON HCL 4 MG/2ML IJ SOLN
INTRAMUSCULAR | Status: DC | PRN
  Administered 2017-08-07: 4 mg via INTRAVENOUS

## 2017-08-07 MED ORDER — DEXAMETHASONE SODIUM PHOSPHATE 10 MG/ML IJ SOLN
INTRAMUSCULAR | Status: AC
  Filled 2017-08-07: qty 1

## 2017-08-07 MED ORDER — BUPIVACAINE-EPINEPHRINE (PF) 0.5% -1:200000 IJ SOLN
INTRAMUSCULAR | Status: AC
  Filled 2017-08-07: qty 30

## 2017-08-07 MED ORDER — FAMOTIDINE 20 MG PO TABS
20.0000 mg | ORAL_TABLET | Freq: Once | ORAL | Status: AC
  Administered 2017-08-07: 20 mg via ORAL

## 2017-08-07 MED ORDER — FENTANYL CITRATE (PF) 100 MCG/2ML IJ SOLN: 25.0000 ug | INTRAMUSCULAR | Status: DC | PRN

## 2017-08-07 MED ORDER — ACETAMINOPHEN 160 MG/5ML PO SOLN: 325.0000 mg | ORAL | Status: DC | PRN

## 2017-08-07 MED ORDER — PROPOFOL 10 MG/ML IV BOLUS
INTRAVENOUS | Status: DC | PRN
  Administered 2017-08-07: 30 mg via INTRAVENOUS
  Administered 2017-08-07: 150 mg via INTRAVENOUS
  Administered 2017-08-07: 20 mg via INTRAVENOUS

## 2017-08-07 MED ORDER — LIDOCAINE HCL (PF) 2 % IJ SOLN
INTRAMUSCULAR | Status: AC
  Filled 2017-08-07: qty 10

## 2017-08-07 MED ORDER — FENTANYL CITRATE (PF) 100 MCG/2ML IJ SOLN
INTRAMUSCULAR | Status: AC
  Filled 2017-08-07: qty 2

## 2017-08-07 MED ORDER — SODIUM CHLORIDE 0.9 % IV SOLN
INTRAVENOUS | Status: DC
  Administered 2017-08-07: 07:00:00 via INTRAVENOUS

## 2017-08-07 MED ORDER — DEXAMETHASONE SODIUM PHOSPHATE 10 MG/ML IJ SOLN
INTRAMUSCULAR | Status: DC | PRN
  Administered 2017-08-07: 5 mg via INTRAVENOUS

## 2017-08-07 MED ORDER — ONDANSETRON HCL 4 MG/2ML IJ SOLN
INTRAMUSCULAR | Status: AC
  Filled 2017-08-07: qty 2

## 2017-08-07 MED ORDER — EPHEDRINE SULFATE 50 MG/ML IJ SOLN
INTRAMUSCULAR | Status: DC | PRN
  Administered 2017-08-07: 10 mg via INTRAVENOUS

## 2017-08-07 MED ORDER — PROMETHAZINE HCL 25 MG/ML IJ SOLN: 6.2500 mg | INTRAMUSCULAR | Status: DC | PRN

## 2017-08-07 MED ORDER — FAMOTIDINE 20 MG PO TABS
ORAL_TABLET | ORAL | Status: AC
  Administered 2017-08-07: 20 mg via ORAL
  Filled 2017-08-07: qty 1

## 2017-08-07 MED ORDER — PROPOFOL 10 MG/ML IV BOLUS
INTRAVENOUS | Status: AC
  Filled 2017-08-07: qty 20

## 2017-08-07 MED ORDER — KETOROLAC TROMETHAMINE 30 MG/ML IJ SOLN
INTRAMUSCULAR | Status: AC
  Filled 2017-08-07: qty 1

## 2017-08-07 MED ORDER — CEFAZOLIN SODIUM-DEXTROSE 2-4 GM/100ML-% IV SOLN
2.0000 g | INTRAVENOUS | Status: AC
  Administered 2017-08-07: 2 g via INTRAVENOUS

## 2017-08-07 MED ORDER — HYDROCODONE-ACETAMINOPHEN 5-325 MG PO TABS: ORAL_TABLET | ORAL | 0 refills | Status: DC

## 2017-08-07 SURGICAL SUPPLY — 34 items
BLADE SURG 15 STRL SS SAFETY (BLADE) ×6 IMPLANT
CANISTER SUCT 1200ML W/VALVE (MISCELLANEOUS) ×3 IMPLANT
CHLORAPREP W/TINT 26ML (MISCELLANEOUS) ×3 IMPLANT
CLOSURE WOUND 1/2 X4 (GAUZE/BANDAGES/DRESSINGS) ×1
DECANTER SPIKE VIAL GLASS SM (MISCELLANEOUS) ×3 IMPLANT
DRAIN PENROSE 1/4X12 LTX (DRAIN) ×3 IMPLANT
DRAPE LAPAROTOMY 100X77 ABD (DRAPES) ×3 IMPLANT
DRSG TEGADERM 4X4.75 (GAUZE/BANDAGES/DRESSINGS) ×3 IMPLANT
DRSG TELFA 4X3 1S NADH ST (GAUZE/BANDAGES/DRESSINGS) ×3 IMPLANT
ELECT REM PT RETURN 9FT ADLT (ELECTROSURGICAL) ×3
ELECTRODE REM PT RTRN 9FT ADLT (ELECTROSURGICAL) ×1 IMPLANT
GLOVE BIO SURGEON STRL SZ7.5 (GLOVE) ×3 IMPLANT
GLOVE INDICATOR 8.0 STRL GRN (GLOVE) ×3 IMPLANT
GOWN STRL REUS W/ TWL LRG LVL3 (GOWN DISPOSABLE) ×2 IMPLANT
GOWN STRL REUS W/TWL LRG LVL3 (GOWN DISPOSABLE) ×6
KIT TURNOVER KIT A (KITS) ×3 IMPLANT
LABEL OR SOLS (LABEL) ×3 IMPLANT
MESH HERNIA 6X12 ULTRAPRO MED (Mesh General) ×1 IMPLANT
MESH HERNIA ULTRAPRO MED (Mesh General) ×2 IMPLANT
NEEDLE HYPO 22GX1.5 SAFETY (NEEDLE) ×6 IMPLANT
PACK BASIN MINOR ARMC (MISCELLANEOUS) ×3 IMPLANT
STRIP CLOSURE SKIN 1/2X4 (GAUZE/BANDAGES/DRESSINGS) ×2 IMPLANT
SUT PDS AB 0 CT1 27 (SUTURE) ×3 IMPLANT
SUT SURGILON 0 BLK (SUTURE) ×6 IMPLANT
SUT VIC AB 2-0 SH 27 (SUTURE) ×3
SUT VIC AB 2-0 SH 27XBRD (SUTURE) ×1 IMPLANT
SUT VIC AB 3-0 54X BRD REEL (SUTURE) ×1 IMPLANT
SUT VIC AB 3-0 BRD 54 (SUTURE) ×3
SUT VIC AB 3-0 SH 27 (SUTURE) ×3
SUT VIC AB 3-0 SH 27X BRD (SUTURE) ×1 IMPLANT
SUT VIC AB 4-0 FS2 27 (SUTURE) ×3 IMPLANT
SWABSTK COMLB BENZOIN TINCTURE (MISCELLANEOUS) ×3 IMPLANT
SYR 10ML LL (SYRINGE) ×3 IMPLANT
SYR 3ML LL SCALE MARK (SYRINGE) ×3 IMPLANT

## 2017-08-07 NOTE — Transfer of Care (Signed)
Immediate Anesthesia Transfer of Care Note  Patient: KRRISH FREUND  Procedure(s) Performed: Procedure(s): HERNIA REPAIR INGUINAL ADULT (Left)  Patient Location: PACU  Anesthesia Type:General  Level of Consciousness: sedated  Airway & Oxygen Therapy: Patient Spontanous Breathing and Patient connected to face mask oxygen  Post-op Assessment: Report given to RN and Post -op Vital signs reviewed and stable  Post vital signs: Reviewed and stable  Last Vitals:  Vitals:   08/07/17 0617 08/07/17 0829  BP: (!) 161/81 (!) 104/50  Pulse: 70   Resp: 18 12  Temp: 36.6 C (!) 36.3 C  SpO2: 161% 096%    Complications: No apparent anesthesia complications

## 2017-08-07 NOTE — Discharge Instructions (Signed)
  AMBULATORY SURGERY  DISCHARGE INSTRUCTIONS   1) The drugs that you were given will stay in your system until tomorrow so for the next 24 hours you should not:  A) Drive an automobile B) Make any legal decisions C) Drink any alcoholic beverage   2) You may resume regular meals tomorrow.  Today it is better to start with liquids and gradually work up to solid foods.  You may eat anything you prefer, but it is better to start with liquids, then soup and crackers, and gradually work up to solid foods.   3) Please notify your doctor immediately if you have any unusual bleeding, trouble breathing, redness and pain at the surgery site, drainage, fever, or pain not relieved by medication.    4) Additional Instructions: TAKE A STOOL SOFTENER TWICE A DAY WHILE TAKING NARCOTIC PAIN MEDICINE TO PREVENT CONSTIPATION   Please contact your physician with any problems or Same Day Surgery at [phone], Monday through Friday 6 am to 4 pm, or [place] [facility] [place] number at [phone].   

## 2017-08-07 NOTE — H&P (Signed)
No change in clinical condition or exam since office visit.  For left inguinal hernia repair.

## 2017-08-07 NOTE — Anesthesia Procedure Notes (Signed)
Procedure Name: LMA Insertion Date/Time: 08/07/2017 7:36 AM Performed by: Doreen Salvage, CRNA Pre-anesthesia Checklist: Patient identified, Patient being monitored, Timeout performed, Emergency Drugs available and Suction available Patient Re-evaluated:Patient Re-evaluated prior to induction Oxygen Delivery Method: Circle system utilized Preoxygenation: Pre-oxygenation with 100% oxygen Induction Type: IV induction Ventilation: Mask ventilation without difficulty LMA: LMA inserted LMA Size: 3.5 Tube type: Oral Number of attempts: 1 Placement Confirmation: positive ETCO2 and breath sounds checked- equal and bilateral Tube secured with: Tape Dental Injury: Teeth and Oropharynx as per pre-operative assessment

## 2017-08-07 NOTE — Op Note (Addendum)
Preoperative diagnosis: Symptomatic left inguinal hernia.  Postoperative diagnosis: Same, with lipoma of the cord.  Operative procedure: Left inguinal hernia repair with excision of lipoma of the cord, repair with medium Ultra Pro mesh.  Operating Surgeon: Hervey Ard, MD.  Anesthesia: General by LMA; Marcaine 0.5% with 1 to 200,000 units of epinephrine, 30 cc.  Toradol: 15 mg.  Estimated blood loss: Less than 5 cc.  Clinical note: This 84 year old male developed a symptomatic left inguinal hernia.  He was admitted for elective repair.  Hair was removed from the surgical site prior to the procedure with some trauma to the skin near the base of the penis.  He received Kefzol prior to the incision for prophylaxis.  SCD stockings for DVT prevention.  Operative note: With the patient under adequate general anesthesia the abdomen was cleansed with ChloraPrep and draped.  Marcaine was infiltrated for postoperative analgesia as a field block.  A 5 cm skin line incision along the anticipated course the inguinal canal was carried down through skin subtendinous tissue with hemostasis achieved by electrocautery.  The external Bleich was opened in the direction of its fibers.  The ilioinguinal and iliohypogastric nerves were identified and protected.  A moderate size indirect sac as well as a significant lipoma the cord was identified.  The lipoma was excised with 3-0 Vicryl ties for hemostasis.  The sac was dissected free into the preperitoneal space.  A medium Ultra Pro mesh was smoothed into the preperitoneal space and the external component along the floor the inguinal canal.  A lateral slit was made for cord passage.  The mesh was anchored to the pubic tubercle with 0 Surgilon sutures.  The inferior aspect of the mesh was anchored to the tunnel ligament with similar sutures.  The medial and superior borders were anchored to the transverse abdominis aponeurosis in a similar fashion.  Toradol was placed  in the wound.  The external Bleich was closed with a running 2-0 Vicryl suture after returning the nerves to their bed.  Scarpa's fascia was closed with a running 3-0 Vicryl suture.  The skin was closed with a running 4-0 Vicryl subcuticular suture.  Benzoin, Steri-Strips, Telfa and Tegaderm dressing applied.  The patient tolerated the procedure well was taken recovery in stable condition.  In the discharge instructions the patient was instructed to resume his Eliquis in 4 days.

## 2017-08-07 NOTE — Anesthesia Post-op Follow-up Note (Signed)
Anesthesia QCDR form completed.        

## 2017-08-07 NOTE — Anesthesia Preprocedure Evaluation (Signed)
Anesthesia Evaluation  Patient identified by MRN, date of birth, ID band Patient awake    Reviewed: Allergy & Precautions, H&P , NPO status , reviewed documented beta blocker date and time   Airway Mallampati: II  TM Distance: >3 FB     Dental  (+) Edentulous Upper, Edentulous Lower   Pulmonary former smoker,    Pulmonary exam normal        Cardiovascular hypertension, + CAD, + Past MI and + Peripheral Vascular Disease  Normal cardiovascular exam+ dysrhythmias   2017 Study Conclusions  - Left ventricle: The cavity size was normal. There was mild   concentric hypertrophy. Systolic function was normal. The   estimated ejection fraction was in the range of 50% to 55%.   Possible hypokinesis of the apicalanterior and apical myocardium.   Left ventricular diastolic function parameters were normal. - Mitral valve: There was mild regurgitation. - Left atrium: The atrium was moderately dilated. - Pulmonary arteries: Systolic pressure was within the normal   range.   Neuro/Psych    GI/Hepatic neg GERD  ,  Endo/Other  diabetes  Renal/GU Renal disease     Musculoskeletal  (+) Arthritis ,   Abdominal   Peds  Hematology   Anesthesia Other Findings Past Medical History: No date: AAA (abdominal aortic aneurysm) ([place]) No date: Arthritis     Comment:  in fingers and hands No date: Cancer ([place])     Comment:  skin cancer removed several No date: Diabetes mellitus without complication ([place]) No date: Dysrhythmia     Comment:  atrial fibrillation.  on eliquis No date: History of kidney stones No date: Hypertension No date: Mitral insufficiency     Comment:  per dr. Clayborn Bigness 1990: Myocardial infarct [facility]) 1990: Transfusion history     Comment:  received several units of blood after CABG Past Surgical History: 2005, [facility]: ABDOMINAL AORTIC ANEURYSM REPAIR 1990: Cardiac bypass     Comment:  [facility]. triple cabg 1990: CARDIAC  CATHETERIZATION     Comment:  stents placed but ultimately clotted and shut down. 1990: CARDIAC SURGERY     Comment:  duplicate entry 03/47/4259: CHOLECYSTECTOMY; N/A     Comment:  Dr Bary Castilla 1990: CORONARY ARTERY BYPASS GRAFT     Comment:  x 3. performed after coronary stents failed No date: EYE SURGERY; Bilateral     Comment:  cataract extractions with iol BMI    Body Mass Index:  23.34 kg/m     Reproductive/Obstetrics                             Anesthesia Physical Anesthesia Plan  ASA: III  Anesthesia Plan: General LMA   Post-op Pain Management:    Induction:   PONV Risk Score and Plan: 2 and Ondansetron, Treatment may vary due to age or medical condition, Dexamethasone and Metaclopromide  Airway Management Planned:   Additional Equipment:   Intra-op Plan:   Post-operative Plan:   Informed Consent: I have reviewed the patients History and Physical, chart, labs and discussed the procedure including the risks, benefits and alternatives for the proposed anesthesia with the patient or authorized representative who has indicated his/her understanding and acceptance.   Dental Advisory Given  Plan Discussed with: CRNA  Anesthesia Plan Comments:         Anesthesia Quick Evaluation

## 2017-08-09 NOTE — Anesthesia Postprocedure Evaluation (Signed)
Anesthesia Post Note  Patient: [name]  Procedure(s) Performed: HERNIA REPAIR INGUINAL ADULT (Left )  Patient location during evaluation: PACU Anesthesia Type: General Level of consciousness: awake and alert Pain management: pain level controlled Vital Signs Assessment: post-procedure vital signs reviewed and stable Respiratory status: spontaneous breathing, nonlabored ventilation, respiratory function stable and patient connected to nasal cannula oxygen Cardiovascular status: blood pressure returned to baseline and stable Postop Assessment: no apparent nausea or vomiting Anesthetic complications: no     Last Vitals:  Vitals:   08/07/17 0909 08/07/17 0951  BP: (!) 140/56 (!) 152/71  Pulse: 79 61  Resp: 16 18  Temp: (!) 36.2 C   SpO2: 97% 100%    Last Pain:  Vitals:   08/08/17 0854  TempSrc:   PainSc: 3                  Kaipo Ardis Harvie Heck

## 2017-08-17 ENCOUNTER — Encounter: Payer: Self-pay | Admitting: General Surgery

## 2017-08-17 ENCOUNTER — Ambulatory Visit (INDEPENDENT_AMBULATORY_CARE_PROVIDER_SITE_OTHER): Payer: Medicare PPO | Admitting: General Surgery

## 2017-08-17 VITALS — BP 118/56 | HR 51 | Resp 12 | Ht 68.0 in | Wt 161.0 lb

## 2017-08-17 DIAGNOSIS — K4090 Unilateral inguinal hernia, without obstruction or gangrene, not specified as recurrent: Secondary | ICD-10-CM

## 2017-08-17 NOTE — Patient Instructions (Addendum)
Patient to return as needed. The patient is aware to call back for any questions or concerns. Proper lifting techniques reviewed.

## 2017-08-17 NOTE — Progress Notes (Signed)
Patient ID: [name], male   DOB: [birth year], 84 y.o.   MRN: [id number]  Chief Complaint  Patient presents with  . Routine Post Op    HPI [name] is a 84 y.o. male here today for his post op left inguinal hernia repair done on 08/07/2017. Patient states he is doing well. Denies any GI issues. He is here with his sister, Thayer Headings from [place].  HPI  Past Medical History:  Diagnosis Date  . AAA (abdominal aortic aneurysm) ([place])   . Arthritis    in fingers and hands  . Cancer ([place])    skin cancer removed several  . Diabetes mellitus without complication ([place])   . Dysrhythmia    atrial fibrillation.  on eliquis  . History of kidney stones   . Hypertension   . Mitral insufficiency    per dr. Clayborn Bigness  . Myocardial infarct ([place]) 1990  . Transfusion history 1990   received several units of blood after CABG    Past Surgical History:  Procedure Laterality Date  . ABDOMINAL AORTIC ANEURYSM REPAIR  2005, [facility]  . Cardiac bypass  1990   [facility]. triple cabg  . CARDIAC CATHETERIZATION  1990   stents placed but ultimately clotted and shut down.  Marland Kitchen CARDIAC SURGERY  1856   duplicate entry  . CHOLECYSTECTOMY N/A 11/10/2008   Dr Bary Castilla  . CORONARY ARTERY BYPASS GRAFT  1990   x 3. performed after coronary stents failed  . EYE SURGERY Bilateral    cataract extractions with iol  . INGUINAL HERNIA REPAIR Left 08/07/2017   Medium Ultra Pro mesh Surgeon: Robert Bellow, MD;  Location: [facility] ORS;  Service: General;  Laterality: Left;    Family History  Problem Relation Age of Onset  . Cerebral aneurysm Mother        died at 68  . CAD Father        died at 53    Social History Social History   Tobacco Use  . Smoking status: Former Smoker    Years: 30.00    Types: Cigarettes    Last attempt to quit: 01/25/1988    Years since quitting: 29.5  . Smokeless tobacco: Never Used  Substance Use Topics  . Alcohol use: No  . Drug use: No    Allergies  Allergen Reactions  .  Morphine And Related Nausea And Vomiting  . Sulfa Antibiotics Rash    Current Outpatient Medications  Medication Sig Dispense Refill  . atorvastatin (LIPITOR) 40 MG tablet Take 40 mg by mouth daily.    . Cholecalciferol (VITAMIN D3) 5000 units CAPS Take 5,000 Units by mouth daily.    Marland Kitchen diltiazem (CARDIZEM CD) 120 MG 24 hr capsule Take 120 mg by mouth twice daily    . ELIQUIS 5 MG TABS tablet Take 1 tablet (5 mg total) by mouth 2 (two) times daily. 1 tablet 0  . glucose blood (BAYER CONTOUR TEST) test strip 1 strip by Does not apply route 3 (three) times daily.    Marland Kitchen lisinopril-hydrochlorothiazide (PRINZIDE,ZESTORETIC) 20-12.5 MG tablet Take 1 tablet by mouth 2 (two) times daily.     . Omega-3 Fatty Acids (FISH OIL) 1000 MG CAPS Take 1,000 mg by mouth daily.     Marland Kitchen tetrahydrozoline (GOODSENSE EYE DROPS) 0.05 % ophthalmic solution Place 1 drop into both eyes daily as needed (dry eyes).    . vitamin B-12 (CYANOCOBALAMIN) 1000 MCG tablet Take 1,000 mcg by mouth 2 (two) times a week.  No current facility-administered medications for this visit.     Review of Systems Review of Systems  Constitutional: Negative.   Respiratory: Negative.   Cardiovascular: Negative.   Gastrointestinal: Negative for constipation and diarrhea.    Blood pressure (!) 118/56, pulse (!) 51, resp. rate 12, height 5\' 8"  (1.727 m), weight 161 lb (73 kg), SpO2 98 %.  Physical Exam Physical Exam  Constitutional: He is oriented to person, place, and time. He appears well-developed and well-nourished.  Abdominal:  Left inguinal hernia repair intact and healing well.   Genitourinary:     Neurological: He is alert and oriented to person, place, and time.  Skin: Skin is warm and dry.       Assessment    Doing well post inguinal hernia repair.    Plan    Patient to return as needed. The patient is aware to call back for any questions or concerns. Proper lifting techniques reviewed.  HPI, Physical Exam,  Assessment and Plan have been scribed under the direction and in the presence of Hervey Ard, MD.  Gaspar Cola, CMA  I have completed the exam and reviewed the above documentation for accuracy and completeness.  I agree with the above.  Haematologist has been used and any errors in dictation or transcription are unintentional.  Hervey Ard, M.D., F.A.C.S.  Forest Gleason Cecilie Heidel 08/18/2017, 6:18 PM

## 2017-08-18 ENCOUNTER — Encounter: Payer: Self-pay | Admitting: General Surgery

## 2017-08-29 ENCOUNTER — Encounter: Payer: Self-pay | Admitting: General Surgery

## 2017-11-01 DIAGNOSIS — C4492 Squamous cell carcinoma of skin, unspecified: Secondary | ICD-10-CM

## 2017-11-01 HISTORY — DX: Squamous cell carcinoma of skin, unspecified: C44.92

## 2019-05-14 ENCOUNTER — Other Ambulatory Visit (INDEPENDENT_AMBULATORY_CARE_PROVIDER_SITE_OTHER): Payer: Self-pay | Admitting: Vascular Surgery

## 2019-05-14 DIAGNOSIS — I739 Peripheral vascular disease, unspecified: Secondary | ICD-10-CM

## 2019-05-17 ENCOUNTER — Other Ambulatory Visit: Payer: Self-pay

## 2019-05-17 ENCOUNTER — Ambulatory Visit (INDEPENDENT_AMBULATORY_CARE_PROVIDER_SITE_OTHER): Payer: Medicare PPO | Admitting: Vascular Surgery

## 2019-05-17 ENCOUNTER — Ambulatory Visit (INDEPENDENT_AMBULATORY_CARE_PROVIDER_SITE_OTHER): Payer: Medicare PPO

## 2019-05-17 ENCOUNTER — Encounter (INDEPENDENT_AMBULATORY_CARE_PROVIDER_SITE_OTHER): Payer: Self-pay | Admitting: Vascular Surgery

## 2019-05-17 ENCOUNTER — Telehealth (INDEPENDENT_AMBULATORY_CARE_PROVIDER_SITE_OTHER): Payer: Self-pay

## 2019-05-17 VITALS — BP 166/82 | HR 97 | Ht 72.0 in | Wt 163.0 lb

## 2019-05-17 DIAGNOSIS — I714 Abdominal aortic aneurysm, without rupture, unspecified: Secondary | ICD-10-CM

## 2019-05-17 DIAGNOSIS — E785 Hyperlipidemia, unspecified: Secondary | ICD-10-CM

## 2019-05-17 DIAGNOSIS — I70221 Atherosclerosis of native arteries of extremities with rest pain, right leg: Secondary | ICD-10-CM

## 2019-05-17 DIAGNOSIS — I10 Essential (primary) hypertension: Secondary | ICD-10-CM

## 2019-05-17 DIAGNOSIS — E119 Type 2 diabetes mellitus without complications: Secondary | ICD-10-CM

## 2019-05-17 DIAGNOSIS — I70229 Atherosclerosis of native arteries of extremities with rest pain, unspecified extremity: Secondary | ICD-10-CM | POA: Insufficient documentation

## 2019-05-17 DIAGNOSIS — I739 Peripheral vascular disease, unspecified: Secondary | ICD-10-CM | POA: Diagnosis not present

## 2019-05-17 DIAGNOSIS — I4891 Unspecified atrial fibrillation: Secondary | ICD-10-CM

## 2019-05-17 NOTE — Progress Notes (Signed)
MRN : [id number]  [name] is a 86 y.o. (1930-04-02) male who presents with chief complaint of  Chief Complaint  Patient presents with  . Follow-up    U/S Follow up  .  History of Present Illness: Patient presents today on referral from Dr. Elvina Mattes for peripheral arterial disease.  He had been previously seen in consultation about 5 years ago and had studies over 3 years ago but has been lost to follow-up.  The last time he was seen, he had what was fairly stable moderate claudication symptoms.  He now has pain with almost no activity in the right leg.  He has pain in the forefoot that sometimes wakes him at night consistent with rest pain.  He does not have open wounds or infection.  No fevers or chills.  He denies any trauma, injury, or causative factors.  He has a known history of fairly severe peripheral vascular disease a few years ago.  He is also had coronary bypass and an abdominal aortic aneurysm.  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  His left common iliac artery is occluded on the scan and there is stenosis of the right external iliac artery.  He says his left leg really does not bother him other than some mild pain with cavity.  To evaluate his perfusion noninvasive studies were performed today.  His right ABI is critically low at 0.15 with dampened monophasic waveform in the anterior tibial artery and no detectable flow in the posterior tibial artery.  Current Outpatient Medications  Medication Sig Dispense Refill  . atorvastatin (LIPITOR) 40 MG tablet Take 40 mg by mouth daily.    . Cholecalciferol (VITAMIN D3) 5000 units CAPS Take 5,000 Units by mouth daily.    Marland Kitchen diltiazem (CARDIZEM CD) 120 MG 24 hr capsule Take 120 mg by mouth twice daily    . diltiazem (TIAZAC) 180 MG 24 hr capsule Take by mouth.    Arne Cleveland 5 MG TABS tablet Take 1 tablet (5 mg total) by mouth 2 (two) times daily. 1 tablet 0   . glucose blood (BAYER CONTOUR TEST) test strip 1 strip by Does not apply route 3 (three) times daily.    Marland Kitchen lisinopril-hydrochlorothiazide (PRINZIDE,ZESTORETIC) 20-12.5 MG tablet Take 1 tablet by mouth 2 (two) times daily.     . Omega-3 Fatty Acids (FISH OIL) 1000 MG CAPS Take 1,000 mg by mouth daily.     Marland Kitchen tetrahydrozoline (GOODSENSE EYE DROPS) 0.05 % ophthalmic solution Place 1 drop into both eyes daily as needed (dry eyes).    . triamcinolone cream (KENALOG) 0.5 % Apply topically.    . vitamin B-12 (CYANOCOBALAMIN) 1000 MCG tablet Take 1,000 mcg by mouth 2 (two) times a week.     No current facility-administered medications for this visit.    Past Medical History:  Diagnosis Date  . AAA (abdominal aortic aneurysm) ([place])   . Arthritis    in fingers and hands  . Cancer ([place])    skin cancer removed several  . Diabetes mellitus without complication ([place])   . Dysrhythmia    atrial fibrillation.  on eliquis  . History of kidney stones   . Hypertension   . Mitral insufficiency    per dr. Clayborn Bigness  . Myocardial infarct ([place]) 1990  . Transfusion history 1990   received several units of blood after CABG    Past Surgical History:  Procedure Laterality Date  . ABDOMINAL AORTIC ANEURYSM REPAIR  2005, [facility]  . Cardiac bypass  1990   [facility]. triple cabg  . CARDIAC CATHETERIZATION  1990   stents placed but ultimately clotted and shut down.  Marland Kitchen CARDIAC SURGERY  0000000   duplicate entry  . CHOLECYSTECTOMY N/A 11/10/2008   Dr Bary Castilla  . CORONARY ARTERY BYPASS GRAFT  1990   x 3. performed after coronary stents failed  . EYE SURGERY Bilateral    cataract extractions with iol  . INGUINAL HERNIA REPAIR Left 08/07/2017   Medium Ultra Pro mesh Surgeon: Robert Bellow, MD;  Location: [facility] ORS;  Service: General;  Laterality: Left;     Social History   Tobacco Use  . Smoking status: Former Smoker    Years: 30.00    Types: Cigarettes    Quit date: 01/25/1988    Years since quitting: 31.3   . Smokeless tobacco: Never Used  Substance Use Topics  . Alcohol use: No  . Drug use: No      Family History  Problem Relation Age of Onset  . Cerebral aneurysm Mother        died at 57  . CAD Father        died at 58  No bleeding or clotting disorders  Allergies  Allergen Reactions  . Morphine And Related Nausea And Vomiting  . Sulfa Antibiotics Rash     REVIEW OF SYSTEMS (Negative unless checked)  Constitutional: [] Weight loss  [] Fever  [] Chills Cardiac: [] Chest pain   [] Chest pressure   [x] Palpitations   [] Shortness of breath when laying flat   [] Shortness of breath at rest   [] Shortness of breath with exertion. Vascular:  [x] Pain in legs with walking   [] Pain in legs at rest   [] Pain in legs when laying flat   [x] Claudication   [] Pain in feet when walking  [x] Pain in feet at rest  [] Pain in feet when laying flat   [] History of DVT   [] Phlebitis   [] Swelling in legs   [] Varicose veins   [] Non-healing ulcers Pulmonary:   [] Uses home oxygen   [] Productive cough   [] Hemoptysis   [] Wheeze  [] COPD   [] Asthma Neurologic:  [] Dizziness  [] Blackouts   [] Seizures   [] History of stroke   [] History of TIA  [] Aphasia   [] Temporary blindness   [] Dysphagia   [] Weakness or numbness in arms   [] Weakness or numbness in legs Musculoskeletal:  [x] Arthritis   [] Joint swelling   [x] Joint pain   [] Low back pain Hematologic:  [] Easy bruising  [] Easy bleeding   [] Hypercoagulable state   [] Anemic   Gastrointestinal:  [] Blood in stool   [] Vomiting blood  [] Gastroesophageal reflux/heartburn   [] Abdominal pain Genitourinary:  [] Chronic kidney disease   [] Difficult urination  [] Frequent urination  [] Burning with urination   [] Hematuria Skin:  [] Rashes   [] Ulcers   [] Wounds Psychological:  [] History of anxiety   []  History of major depression.  Physical Examination  BP (!) 166/82   Pulse 97   Ht 6' (1.829 m)   Wt 163 lb (73.9 kg)   BMI 22.11 kg/m  Gen:  WD/WN, NAD Head: [place]/AT, No temporalis  wasting. Ear/Nose/Throat: Hearing diminished, nares w/o erythema or drainage Eyes: Conjunctiva injected. Sclera non-icteric Neck: Supple.  Trachea midline Pulmonary:  Good air movement, no use of accessory muscles.  Cardiac: Irregular Vascular:  Vessel Right Left  Radial Palpable Palpable  PT  not palpable  not palpable  DP  not palpable  not palpable   Gastrointestinal: soft, non-tender/non-distended. Musculoskeletal: M/S 5/5 throughout.  No deformity or atrophy.  No edema. Neurologic: Sensation grossly intact in extremities.  Symmetrical.  Speech is fluent.  Psychiatric: Judgment intact, Mood & affect appropriate for pt's clinical situation. Dermatologic: No rashes or ulcers noted.  No cellulitis or open wounds.       Labs No results found for this or any previous visit (from the past 2160 hour(s)).  Radiology No results found.  Assessment/Plan  Essential hypertension blood pressure control important in reducing the progression of atherosclerotic disease. On appropriate oral medications.   Controlled type 2 diabetes mellitus without complication (HCC) blood glucose control important in reducing the progression of atherosclerotic disease. Also, involved in wound healing. On appropriate medications.   Hyperlipidemia lipid control important in reducing the progression of atherosclerotic disease. Continue statin therapy   AAA (abdominal aortic aneurysm) ([place]) He had an abdominal aortic aneurysm seen on a CT scan 4 years ago that was a very complex aneurysm with large amounts of mural thrombus.  His left common iliac artery was occluded.  His right external iliac artery was stenotic.  This was also a juxtarenal aneurysm and not 1 that would likely be amenable to endovascular therapy for aneurysm repair in and of itself.  We are going to be doing an angiogram in the near future and may get a better feel for his anatomy of the aneurysm as  well.  Atrial fibrillation, new onset ([place]) On anticoagulation and rate controlled  Atherosclerosis of native arteries of extremity with rest pain [facility]) To evaluate his perfusion noninvasive studies were performed today.  His right ABI is critically low at 0.15 with dampened monophasic waveform in the anterior tibial artery and no detectable flow in the posterior tibial artery. This is an incredibly complex and difficult situation given his known aneurysm which complicates things significantly.  We also have dramatically reduced blood flow in that right leg.  I am fearful that his right iliac stenosis is now occluded and that gaining inflow is going to be difficult.  I am sure he has very significant infrainguinal disease as well.  I will plan a right lower extremity angiogram and likely iliac intervention in the near future at his convenience.  I have discussed the risks and benefits of the procedure.  Also discussed that some form of open surgical therapy is highly likely at some point going forward to improve his perfusion.  If left alone, he is clearly at risk of limb loss.    Leotis Pain, MD  05/17/2019 9:17 AM    This note was created with Dragon medical transcription system.  Any errors from dictation are purely unintentional

## 2019-05-17 NOTE — Patient Instructions (Signed)
Abdominal Aortic Aneurysm  An aneurysm is a bulge in one of the blood vessels that carry blood away from the heart (artery). It happens when blood pushes up against a weak or damaged place in the wall of an artery. An abdominal aortic aneurysm happens in the main artery of the body (aorta). Some aneurysms may not cause problems. If it grows, it can burst or tear, causing bleeding inside the body. This is an emergency. It needs to be treated right away. What are the causes? The exact cause of this condition is not known. What increases the risk? The following may make you more likely to get this condition:  Being a male who is 60 years of age or older.  Being white (Caucasian).  Using tobacco.  Having a family history of aneurysms.  Having the following conditions: ? Hardening of the arteries (arteriosclerosis). ? Inflammation of the walls of an artery (arteritis). ? Certain genetic conditions. ? Being very overweight (obesity). ? An infection in the wall of the aorta (infectious aortitis). ? High cholesterol. ? High blood pressure (hypertension). What are the signs or symptoms? Symptoms depend on the size of the aneurysm and how fast it is growing. Most grow slowly and do not cause any symptoms. If symptoms do occur, they may include:  Pain in the belly (abdomen), side, or back.  Feeling full after eating only small amounts of food.  Feeling a throbbing lump in the belly. Symptoms that the aneurysm has burst (ruptured) include:  Sudden, very bad pain in the belly, side, or back.  Feeling sick to your stomach (nauseous).  Throwing up (vomiting).  Feeling light-headed or passing out. How is this treated? Treatment for this condition depends on:  The size of the aneurysm.  How fast it is growing.  Your age.  Your risk of having it burst. If your aneurysm is smaller than 2 inches (5 cm), your doctor may manage it by:  Checking it often to see if it is getting bigger.  You may have an imaging test (ultrasound) to check it every 3-6 months, every year, or every few years.  Giving you medicines to: ? Control blood pressure. ? Treat pain. ? Fight infection. If your aneurysm is larger than 2 inches (5 cm), you may need surgery to fix it. Follow these instructions at home: Lifestyle  Do not use any products that have nicotine or tobacco in them. This includes cigarettes, e-cigarettes, and chewing tobacco. If you need help quitting, ask your doctor.  Get regular exercise. Ask your doctor what types of exercise are best for you. Eating and drinking  Eat a heart-healthy diet. This includes eating plenty of: ? Fresh fruits and vegetables. ? Whole grains. ? Low-fat (lean) protein. ? Low-fat dairy products.  Avoid foods that are high in saturated fat and cholesterol. These foods include red meat and some dairy products.  Do not drink alcohol if: ? Your doctor tells you not to drink. ? You are pregnant, may be pregnant, or are planning to become pregnant.  If you drink alcohol: ? Limit how much you use to:  0-1 drink a day for women.  0-2 drinks a day for men. ? Be aware of how much alcohol is in your drink. In the [place] one drink equals any of these:  One typical bottle of beer (12 oz).  One-half glass of wine (5 oz).  One shot of hard liquor (1 oz). General instructions  Take over-the-counter and prescription medicines only as   told by your doctor.  Keep your blood pressure within normal limits. Ask your doctor what your blood pressure should be.  Have your blood sugar (glucose) level and cholesterol levels checked regularly. Keep your blood sugar level and cholesterol levels within normal limits.  Avoid heavy lifting and activities that take a lot of effort. Ask your doctor what activities are safe for you.  Keep all follow-up visits as told by your doctor. This is important. ? Talk to your doctor about regular screenings to see if the  aneurysm is getting bigger. Contact a doctor if you:  Have pain in your belly, side, or back.  Have a throbbing feeling in your belly.  Have a family history of aneurysms. Get help right away if you:  Have sudden, bad pain in your belly, side, or back.  Feel sick to your stomach.  Throw up.  Have trouble pooping (constipation).  Have trouble peeing (urinating).  Feel light-headed.  Have a fast heart rate when you stand.  Have sweaty skin that is cold to the touch (clammy).  Have shortness of breath.  Have a fever. These symptoms may be an emergency. Do not wait to see if the symptoms will go away. Get medical help right away. Call your local emergency services (911 in the [place] Do not drive yourself to the hospital. Summary  An aneurysm is a bulge in one of the blood vessels that carry blood away from the heart (artery). Some aneurysms may not cause problems.  You may need to have yours checked often. If it grows, it can burst or tear. This causes bleeding inside the body. It needs to be treated right away.  Follow instructions from your doctor about healthy lifestyle changes.  Keep all follow-up visits as told by your doctor. This is important. This information is not intended to replace advice given to you by your health care provider. Make sure you discuss any questions you have with your health care provider. Document Revised: 04/30/2018 Document Reviewed: 08/19/2017 Elsevier Patient Education  2020 [employer]  

## 2019-05-17 NOTE — Telephone Encounter (Signed)
Pt's sister called and wants to know why the pt was admitted to the hospital today by Dr. Lucky Cowboy. The pt's sister said that the pt could really explain to her why. The pt  Was seen today in our office  This is the assessment and plan Note. Assessment & Plan Note by Algernon Huxley, MD at 05/17/2019 9:16 AM Author: Algernon Huxley, MD Author Type: Physician Filed: 05/17/2019 9:17 AM  Note Status: Written Cosign: Cosign Not Required Encounter Date: 05/17/2019  Problem: Atherosclerosis of native arteries of extremity with rest pain [facility])  Editor: Algernon Huxley, MD (Physician)    To evaluate his perfusion noninvasive studies were performed today.  His right ABI is critically low at 0.15 with dampened monophasic waveform in the anterior tibial artery and no detectable flow in the posterior tibial artery. This is an incredibly complex and difficult situation given his known aneurysm which complicates things significantly.  We also have dramatically reduced blood flow in that right leg.  I am fearful that his right iliac stenosis is now occluded and that gaining inflow is going to be difficult.  I am sure he has very significant infrainguinal disease as well.  I will plan a right lower extremity angiogram and likely iliac intervention in the near future at his convenience.  I have discussed the risks and benefits of the procedure.  Also discussed that some form of open surgical therapy is highly likely at some point going forward to improve his perfusion.  If left alone, he is clearly at risk of limb loss.    Assessment & Plan Note by Algernon Huxley, MD at 05/17/2019 9:15 AM Author: Algernon Huxley, MD Author Type: Physician Filed: 05/17/2019 9:15 AM  Note Status: Written Cosign: Cosign Not Required Encounter Date: 05/17/2019  Problem: Atrial fibrillation, new onset [facility])  Editor: Algernon Huxley, MD (Physician)    On anticoagulation and rate controlled    Assessment & Plan Note by Algernon Huxley, MD at 05/17/2019 9:14  AM Author: Algernon Huxley, MD Author Type: Physician Filed: 05/17/2019 9:15 AM  Note Status: Written Cosign: Cosign Not Required Encounter Date: 05/17/2019  Problem: AAA (abdominal aortic aneurysm) [facility])  Editor: Algernon Huxley, MD (Physician)    He had an abdominal aortic aneurysm seen on a CT scan 4 years ago that was a very complex aneurysm with large amounts of mural thrombus.  His left common iliac artery was occluded.  His right external iliac artery was stenotic.  This was also a juxtarenal aneurysm and not 1 that would likely be amenable to endovascular therapy for aneurysm repair in and of itself.  We are going to be doing an angiogram in the near future and may get a better feel for his anatomy of the aneurysm as well.

## 2019-05-17 NOTE — Assessment & Plan Note (Signed)
lipid control important in reducing the progression of atherosclerotic disease. Continue statin therapy  

## 2019-05-17 NOTE — Telephone Encounter (Signed)
I called the pts sister and made her aware of the NP's message below and she was very relived to know that it was an angio outpatient procedure.

## 2019-05-17 NOTE — Telephone Encounter (Signed)
As far as I know we didn't admit him.  Based on what the chart shows he's not at the hospital.  He did see Dr. Lucky Cowboy today but he wants him scheduled for an outpatient procedure due to the decreased blood flow in his right leg. So if he is at the hospital, she may need to speak with someone at the hospital

## 2019-05-17 NOTE — Assessment & Plan Note (Signed)
On anticoagulation and rate controlled 

## 2019-05-17 NOTE — Assessment & Plan Note (Signed)
He had an abdominal aortic aneurysm seen on a CT scan 4 years ago that was a very complex aneurysm with large amounts of mural thrombus.  His left common iliac artery was occluded.  His right external iliac artery was stenotic.  This was also a juxtarenal aneurysm and not 1 that would likely be amenable to endovascular therapy for aneurysm repair in and of itself.  We are going to be doing an angiogram in the near future and may get a better feel for his anatomy of the aneurysm as well.

## 2019-05-17 NOTE — Assessment & Plan Note (Signed)
blood glucose control important in reducing the progression of atherosclerotic disease. Also, involved in wound healing. On appropriate medications.  

## 2019-05-17 NOTE — Assessment & Plan Note (Signed)
To evaluate his perfusion noninvasive studies were performed today.  His right ABI is critically low at 0.15 with dampened monophasic waveform in the anterior tibial artery and no detectable flow in the posterior tibial artery. This is an incredibly complex and difficult situation given his known aneurysm which complicates things significantly.  We also have dramatically reduced blood flow in that right leg.  I am fearful that his right iliac stenosis is now occluded and that gaining inflow is going to be difficult.  I am sure he has very significant infrainguinal disease as well.  I will plan a right lower extremity angiogram and likely iliac intervention in the near future at his convenience.  I have discussed the risks and benefits of the procedure.  Also discussed that some form of open surgical therapy is highly likely at some point going forward to improve his perfusion.  If left alone, he is clearly at risk of limb loss.

## 2019-05-17 NOTE — Assessment & Plan Note (Signed)
blood pressure control important in reducing the progression of atherosclerotic disease. On appropriate oral medications.  

## 2019-05-20 ENCOUNTER — Other Ambulatory Visit: Payer: Self-pay | Admitting: Internal Medicine

## 2019-05-20 ENCOUNTER — Telehealth (INDEPENDENT_AMBULATORY_CARE_PROVIDER_SITE_OTHER): Payer: Self-pay

## 2019-05-20 NOTE — Telephone Encounter (Signed)
I attempted to contact the patient regarding scheduling for a angio with Dr. Lucky Cowboy and a message was left for a return call.

## 2019-05-20 NOTE — Telephone Encounter (Signed)
Patient called back and is now scheduled with Dr. Lucky Cowboy for a right leg angio on 05/27/19 with a 8:45 am arrival time to the [facility]. Patient will do covid testing on 05/23/19 between 8-1 pm at the [facility]. Pre-procedure instructions were discussed and will be mailed to the patient.

## 2019-05-23 ENCOUNTER — Other Ambulatory Visit
Admission: RE | Admit: 2019-05-23 | Discharge: 2019-05-23 | Disposition: A | Discharge status: Home / Self Care | Payer: Medicare PPO | Source: Ambulatory Visit | Attending: Vascular Surgery | Admitting: Vascular Surgery

## 2019-05-23 ENCOUNTER — Other Ambulatory Visit: Payer: Self-pay

## 2019-05-23 DIAGNOSIS — Z01812 Encounter for preprocedural laboratory examination: Secondary | ICD-10-CM | POA: Diagnosis present

## 2019-05-23 DIAGNOSIS — Z20822 Contact with and (suspected) exposure to covid-19: Secondary | ICD-10-CM | POA: Diagnosis not present

## 2019-05-23 LAB — SARS CORONAVIRUS 2 (TAT 6-24 HRS): SARS Coronavirus 2: NEGATIVE

## 2019-05-26 ENCOUNTER — Other Ambulatory Visit (INDEPENDENT_AMBULATORY_CARE_PROVIDER_SITE_OTHER): Payer: Self-pay | Admitting: Nurse Practitioner

## 2019-05-27 ENCOUNTER — Encounter: Payer: Self-pay | Admitting: Vascular Surgery

## 2019-05-27 ENCOUNTER — Inpatient Hospital Stay
Admission: AD | Admit: 2019-05-27 | Discharge: 2019-05-30 | DRG: 254 | Disposition: A | Discharge status: Home / Self Care | Payer: Medicare PPO | Attending: Vascular Surgery | Admitting: Vascular Surgery

## 2019-05-27 ENCOUNTER — Inpatient Hospital Stay
Admission: AD | Admit: 2019-05-27 | Discharge: 2019-05-27 | Disposition: A | Discharge status: Home / Self Care | Payer: Medicare PPO | Source: Home / Self Care | Attending: Physician Assistant | Admitting: Physician Assistant

## 2019-05-27 ENCOUNTER — Inpatient Hospital Stay: Payer: Medicare PPO

## 2019-05-27 ENCOUNTER — Encounter
Admission: AD | Disposition: A | Discharge status: Home / Self Care | Payer: Self-pay | Source: Home / Self Care | Attending: Vascular Surgery

## 2019-05-27 ENCOUNTER — Other Ambulatory Visit: Payer: Self-pay

## 2019-05-27 DIAGNOSIS — Z8673 Personal history of transient ischemic attack (TIA), and cerebral infarction without residual deficits: Secondary | ICD-10-CM | POA: Diagnosis not present

## 2019-05-27 DIAGNOSIS — I70229 Atherosclerosis of native arteries of extremities with rest pain, unspecified extremity: Secondary | ICD-10-CM

## 2019-05-27 DIAGNOSIS — E1122 Type 2 diabetes mellitus with diabetic chronic kidney disease: Secondary | ICD-10-CM | POA: Diagnosis present

## 2019-05-27 DIAGNOSIS — M1990 Unspecified osteoarthritis, unspecified site: Secondary | ICD-10-CM | POA: Diagnosis present

## 2019-05-27 DIAGNOSIS — N182 Chronic kidney disease, stage 2 (mild): Secondary | ICD-10-CM | POA: Diagnosis present

## 2019-05-27 DIAGNOSIS — I480 Paroxysmal atrial fibrillation: Secondary | ICD-10-CM | POA: Diagnosis present

## 2019-05-27 DIAGNOSIS — Z87891 Personal history of nicotine dependence: Secondary | ICD-10-CM | POA: Diagnosis not present

## 2019-05-27 DIAGNOSIS — Z951 Presence of aortocoronary bypass graft: Secondary | ICD-10-CM | POA: Diagnosis not present

## 2019-05-27 DIAGNOSIS — Z79899 Other long term (current) drug therapy: Secondary | ICD-10-CM | POA: Diagnosis not present

## 2019-05-27 DIAGNOSIS — Z85828 Personal history of other malignant neoplasm of skin: Secondary | ICD-10-CM

## 2019-05-27 DIAGNOSIS — I10 Essential (primary) hypertension: Secondary | ICD-10-CM | POA: Diagnosis not present

## 2019-05-27 DIAGNOSIS — I2510 Atherosclerotic heart disease of native coronary artery without angina pectoris: Secondary | ICD-10-CM | POA: Diagnosis present

## 2019-05-27 DIAGNOSIS — I252 Old myocardial infarction: Secondary | ICD-10-CM

## 2019-05-27 DIAGNOSIS — R001 Bradycardia, unspecified: Secondary | ICD-10-CM | POA: Diagnosis present

## 2019-05-27 DIAGNOSIS — E1151 Type 2 diabetes mellitus with diabetic peripheral angiopathy without gangrene: Secondary | ICD-10-CM | POA: Diagnosis present

## 2019-05-27 DIAGNOSIS — I340 Nonrheumatic mitral (valve) insufficiency: Secondary | ICD-10-CM | POA: Diagnosis present

## 2019-05-27 DIAGNOSIS — I70221 Atherosclerosis of native arteries of extremities with rest pain, right leg: Secondary | ICD-10-CM | POA: Diagnosis present

## 2019-05-27 DIAGNOSIS — I714 Abdominal aortic aneurysm, without rupture: Secondary | ICD-10-CM | POA: Diagnosis present

## 2019-05-27 DIAGNOSIS — Z8249 Family history of ischemic heart disease and other diseases of the circulatory system: Secondary | ICD-10-CM | POA: Diagnosis not present

## 2019-05-27 DIAGNOSIS — Z01818 Encounter for other preprocedural examination: Secondary | ICD-10-CM | POA: Diagnosis not present

## 2019-05-27 DIAGNOSIS — I129 Hypertensive chronic kidney disease with stage 1 through stage 4 chronic kidney disease, or unspecified chronic kidney disease: Secondary | ICD-10-CM | POA: Diagnosis present

## 2019-05-27 DIAGNOSIS — I708 Atherosclerosis of other arteries: Secondary | ICD-10-CM | POA: Diagnosis present

## 2019-05-27 DIAGNOSIS — Z955 Presence of coronary angioplasty implant and graft: Secondary | ICD-10-CM

## 2019-05-27 DIAGNOSIS — I745 Embolism and thrombosis of iliac artery: Secondary | ICD-10-CM | POA: Diagnosis present

## 2019-05-27 DIAGNOSIS — Z8679 Personal history of other diseases of the circulatory system: Secondary | ICD-10-CM

## 2019-05-27 DIAGNOSIS — E785 Hyperlipidemia, unspecified: Secondary | ICD-10-CM | POA: Diagnosis present

## 2019-05-27 DIAGNOSIS — I700 Atherosclerosis of aorta: Secondary | ICD-10-CM | POA: Diagnosis not present

## 2019-05-27 DIAGNOSIS — Z7901 Long term (current) use of anticoagulants: Secondary | ICD-10-CM | POA: Diagnosis not present

## 2019-05-27 HISTORY — PX: LOWER EXTREMITY ANGIOGRAPHY: CATH118251

## 2019-05-27 LAB — GLUCOSE, CAPILLARY
Glucose-Capillary: 185 mg/dL — ABNORMAL HIGH (ref 70–99)
Glucose-Capillary: 156 mg/dL — ABNORMAL HIGH (ref 70–99)
Glucose-Capillary: 100 mg/dL — ABNORMAL HIGH (ref 70–99)

## 2019-05-27 LAB — BUN: BUN: 30 mg/dL — ABNORMAL HIGH (ref 8–23)

## 2019-05-27 LAB — CREATININE, SERUM
Creatinine, Ser: 1.76 mg/dL — ABNORMAL HIGH (ref 0.61–1.24)
GFR calc Af Amer: 39 mL/min — ABNORMAL LOW (ref >60)
GFR calc non Af Amer: 34 mL/min — ABNORMAL LOW (ref >60)

## 2019-05-27 SURGERY — LOWER EXTREMITY ANGIOGRAPHY
Anesthesia: Moderate Sedation | Site: Leg Lower | Laterality: Right

## 2019-05-27 MED ORDER — INSULIN ASPART 100 UNIT/ML ~~LOC~~ SOLN: 0.0000 [IU] | Freq: Every day | SUBCUTANEOUS | Status: DC

## 2019-05-27 MED ORDER — MIDAZOLAM HCL 2 MG/2ML IJ SOLN
INTRAMUSCULAR | Status: DC | PRN
  Administered 2019-05-27: 2 mg via INTRAVENOUS
  Administered 2019-05-27: 1 mg
  Administered 2019-05-27: 2 mg

## 2019-05-27 MED ORDER — HEPARIN SODIUM (PORCINE) 5000 UNIT/ML IJ SOLN
5000.0000 [IU] | Freq: Three times a day (TID) | INTRAMUSCULAR | Status: DC
  Administered 2019-05-27 – 2019-05-28 (×5): 5000 [IU] via SUBCUTANEOUS
  Filled 2019-05-27 (×5): qty 1

## 2019-05-27 MED ORDER — CEFAZOLIN SODIUM-DEXTROSE 2-4 GM/100ML-% IV SOLN
2.0000 g | Freq: Once | INTRAVENOUS | Status: DC
  Administered 2019-05-27: 2 g via INTRAVENOUS

## 2019-05-27 MED ORDER — INSULIN ASPART 100 UNIT/ML ~~LOC~~ SOLN
0.0000 [IU] | Freq: Three times a day (TID) | SUBCUTANEOUS | Status: DC
  Administered 2019-05-27 – 2019-05-28 (×2): 3 [IU] via SUBCUTANEOUS
  Administered 2019-05-28 – 2019-05-30 (×3): 2 [IU] via SUBCUTANEOUS
  Filled 2019-05-27 (×5): qty 1

## 2019-05-27 MED ORDER — FENTANYL CITRATE (PF) 100 MCG/2ML IJ SOLN
INTRAMUSCULAR | Status: DC | PRN
  Administered 2019-05-27: 50 ug via INTRAVENOUS
  Administered 2019-05-27: 50 ug

## 2019-05-27 MED ORDER — FENTANYL CITRATE (PF) 100 MCG/2ML IJ SOLN
INTRAMUSCULAR | Status: AC
  Filled 2019-05-27: qty 2

## 2019-05-27 MED ORDER — SODIUM CHLORIDE 0.9 % IV SOLN: INTRAVENOUS | Status: DC

## 2019-05-27 MED ORDER — MIDAZOLAM HCL 2 MG/ML PO SYRP: 8.0000 mg | ORAL_SOLUTION | Freq: Once | ORAL | Status: DC | PRN

## 2019-05-27 MED ORDER — LISINOPRIL 20 MG PO TABS
20.0000 mg | ORAL_TABLET | Freq: Two times a day (BID) | ORAL | Status: DC
  Administered 2019-05-28 – 2019-05-30 (×4): 20 mg via ORAL
  Filled 2019-05-27 (×4): qty 1

## 2019-05-27 MED ORDER — DILTIAZEM HCL ER COATED BEADS 120 MG PO CP24: 120.0000 mg | ORAL_CAPSULE | Freq: Every day | ORAL | Status: DC

## 2019-05-27 MED ORDER — ACETAMINOPHEN 325 MG PO TABS
650.0000 mg | ORAL_TABLET | Freq: Four times a day (QID) | ORAL | Status: DC | PRN
  Administered 2019-05-28: 650 mg via ORAL
  Filled 2019-05-27: qty 2

## 2019-05-27 MED ORDER — MIDAZOLAM HCL 5 MG/5ML IJ SOLN
INTRAMUSCULAR | Status: AC
  Filled 2019-05-27: qty 5

## 2019-05-27 MED ORDER — FENTANYL CITRATE (PF) 100 MCG/2ML IJ SOLN: 12.5000 ug | Freq: Once | INTRAMUSCULAR | Status: DC | PRN

## 2019-05-27 MED ORDER — HEPARIN SODIUM (PORCINE) 1000 UNIT/ML IJ SOLN
INTRAMUSCULAR | Status: DC | PRN
  Administered 2019-05-27: 3000 [IU] via INTRAVENOUS
  Administered 2019-05-27: 2000 [IU] via INTRAVENOUS

## 2019-05-27 MED ORDER — HYDROCHLOROTHIAZIDE 12.5 MG PO CAPS
12.5000 mg | ORAL_CAPSULE | Freq: Two times a day (BID) | ORAL | Status: DC
  Administered 2019-05-28 – 2019-05-30 (×4): 12.5 mg via ORAL
  Filled 2019-05-27 (×5): qty 1

## 2019-05-27 MED ORDER — ONDANSETRON HCL 4 MG/2ML IJ SOLN: 4.0000 mg | Freq: Four times a day (QID) | INTRAMUSCULAR | Status: DC | PRN

## 2019-05-27 MED ORDER — ATORVASTATIN CALCIUM 20 MG PO TABS
40.0000 mg | ORAL_TABLET | Freq: Every day | ORAL | Status: DC
  Administered 2019-05-27 – 2019-05-30 (×3): 40 mg via ORAL
  Filled 2019-05-27 (×3): qty 2

## 2019-05-27 MED ORDER — LISINOPRIL-HYDROCHLOROTHIAZIDE 20-12.5 MG PO TABS: 1.0000 | ORAL_TABLET | Freq: Two times a day (BID) | ORAL | Status: DC

## 2019-05-27 MED ORDER — CEFAZOLIN SODIUM-DEXTROSE 2-4 GM/100ML-% IV SOLN
INTRAVENOUS | Status: AC
  Filled 2019-05-27: qty 100

## 2019-05-27 MED ORDER — DIPHENHYDRAMINE HCL 50 MG/ML IJ SOLN: 50.0000 mg | Freq: Once | INTRAMUSCULAR | Status: DC | PRN

## 2019-05-27 MED ORDER — METHYLPREDNISOLONE SODIUM SUCC 125 MG IJ SOLR: 125.0000 mg | Freq: Once | INTRAMUSCULAR | Status: DC | PRN

## 2019-05-27 MED ORDER — ASPIRIN EC 81 MG PO TBEC
81.0000 mg | DELAYED_RELEASE_TABLET | Freq: Every day | ORAL | Status: DC
  Administered 2019-05-27 – 2019-05-30 (×3): 81 mg via ORAL
  Filled 2019-05-27 (×3): qty 1

## 2019-05-27 MED ORDER — HEPARIN SODIUM (PORCINE) 1000 UNIT/ML IJ SOLN
INTRAMUSCULAR | Status: AC
  Filled 2019-05-27: qty 1

## 2019-05-27 MED ORDER — FAMOTIDINE 20 MG PO TABS: 40.0000 mg | ORAL_TABLET | Freq: Once | ORAL | Status: DC | PRN

## 2019-05-27 SURGICAL SUPPLY — 10 items
CANNULA 5F STIFF (CANNULA) ×2 IMPLANT
CATH BEACON 5 .035 40 KMP TP (CATHETERS) IMPLANT
CATH BEACON 5 .038 40 KMP TP (CATHETERS) ×3
CATH PIG 70CM (CATHETERS) ×2 IMPLANT
DEVICE STARCLOSE SE CLOSURE (Vascular Products) ×2 IMPLANT
GLIDEWIRE ADV .035X180CM (WIRE) ×2 IMPLANT
PACK ANGIOGRAPHY (CUSTOM PROCEDURE TRAY) ×2 IMPLANT
SHEATH BRITE TIP 5FRX11 (SHEATH) ×4 IMPLANT
TUBING CONTRAST HIGH PRESS 72 (TUBING) ×2 IMPLANT
WIRE J 3MM .035X145CM (WIRE) ×2 IMPLANT

## 2019-05-27 NOTE — Progress Notes (Signed)
*  PRELIMINARY RESULTS* Echocardiogram 2D Echocardiogram has been performed.  [name] 05/27/2019, 7:52 PM

## 2019-05-27 NOTE — H&P (Signed)
[facility]  H&P  History of Present Illness:  Patient presents today for a scheduled right lower extremity angiogram. Last seen in our office on 05/17/19. Patient endorses a history of pain with almost no activity in the right leg. He has pain in the forefoot that sometimes wakes him at night consistent with rest pain. He does not have open wounds or infection. No fevers or chills. He denies any trauma, injury, or causative factors. He has a known history of fairly severe peripheral vascular disease a few years ago. He is also had coronary bypass and an abdominal aortic aneurysm. 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 His left common iliac artery is occluded on the scan and there is stenosis of the right external iliac artery. He says his left leg really does not bother him other than some mild pain with cavity. To evaluate his perfusion noninvasive studies were performed today. His right ABI is critically low at 0.15 with dampened monophasic waveform in the anterior tibial artery and no detectable flow in the posterior tibial artery.         Current Outpatient Medications  Medication Sig Dispense Refill  . atorvastatin (LIPITOR) 40 MG tablet Take 40 mg by mouth daily.    . Cholecalciferol (VITAMIN D3) 5000 units CAPS Take 5,000 Units by mouth daily.    Marland Kitchen diltiazem (CARDIZEM CD) 120 MG 24 hr capsule Take 120 mg by mouth twice daily    . diltiazem (TIAZAC) 180 MG 24 hr capsule Take by mouth.    Arne Cleveland 5 MG TABS tablet Take 1 tablet (5 mg total) by mouth 2 (two) times daily. 1 tablet 0  . glucose blood (BAYER CONTOUR TEST) test strip 1 strip by Does not apply route 3 (three) times daily.    Marland Kitchen lisinopril-hydrochlorothiazide (PRINZIDE,ZESTORETIC) 20-12.5 MG tablet Take 1 tablet by mouth 2 (two) times daily.     . Omega-3 Fatty Acids (FISH OIL) 1000 MG CAPS Take 1,000 mg by mouth daily.     Marland Kitchen tetrahydrozoline  (GOODSENSE EYE DROPS) 0.05 % ophthalmic solution Place 1 drop into both eyes daily as needed (dry eyes).    . triamcinolone cream (KENALOG) 0.5 % Apply topically.    . vitamin B-12 (CYANOCOBALAMIN) 1000 MCG tablet Take 1,000 mcg by mouth 2 (two) times a week.         Past Medical History:  Diagnosis Date  . AAA (abdominal aortic aneurysm) ([place])   . Arthritis    in fingers and hands  . Cancer ([place])    skin cancer removed several  . Diabetes mellitus without complication ([place])   . Dysrhythmia    atrial fibrillation. on eliquis  . History of kidney stones   . Hypertension   . Mitral insufficiency    per dr. Clayborn Bigness  . Myocardial infarct ([place]) 1990  . Transfusion history 1990   received several units of blood after CABG        Past Surgical History:  Procedure Laterality Date  . ABDOMINAL AORTIC ANEURYSM REPAIR  2005, [facility]  . Cardiac bypass  1990   [facility]. triple cabg  . CARDIAC CATHETERIZATION  1990   stents placed but ultimately clotted and shut down.  Marland Kitchen CARDIAC SURGERY  0000000   duplicate entry  . CHOLECYSTECTOMY N/A 11/10/2008   Dr Bary Castilla  . CORONARY ARTERY BYPASS GRAFT  1990   x 3. performed after coronary stents  failed  . EYE SURGERY Bilateral    cataract extractions with iol  . INGUINAL HERNIA REPAIR Left 08/07/2017   Medium Ultra Pro mesh Surgeon: Robert Bellow, MD; Location: [facility] ORS; Service: General; Laterality: Left;   Social History        Tobacco Use  . Smoking status: Former Smoker    Years: 30.00    Types: Cigarettes    Quit date: 01/25/1988    Years since quitting: 31.3  . Smokeless tobacco: Never Used  Substance Use Topics  . Alcohol use: No  . Drug use: No        Family History  Problem Relation Age of Onset  . Cerebral aneurysm Mother    died at 75  . CAD Father    died at 10  No bleeding or clotting disorders      Allergies  Allergen Reactions  . Morphine And Related Nausea And Vomiting  . Sulfa Antibiotics Rash   REVIEW OF  SYSTEMS (Negative unless checked)  Constitutional: ?Weight loss ?Fever ?Chills  Cardiac: ?Chest pain ?Chest pressure ?Palpitations ?Shortness of breath when laying flat ?Shortness of breath at rest ?Shortness of breath with exertion.  Vascular: ?Pain in legs with walking ?Pain in legs at rest ?Pain in legs when laying flat ?Claudication ?Pain in feet when walking ?Pain in feet at rest ?Pain in feet when laying flat ?History of DVT ?Phlebitis ?Swelling in legs ?Varicose veins ?Non-healing ulcers  Pulmonary: ?Uses home oxygen ?Productive cough ?Hemoptysis ?Wheeze ?COPD ?Asthma  Neurologic: ?Dizziness ?Blackouts ?Seizures ?History of stroke ?History of TIA ?Aphasia ?Temporary blindness ?Dysphagia ?Weakness or numbness in arms ?Weakness or numbness in legs  Musculoskeletal: ?Arthritis ?Joint swelling ?Joint pain ?Low back pain  Hematologic: ?Easy bruising ?Easy bleeding ?Hypercoagulable state ?Anemic  Gastrointestinal: ?Blood in stool ?Vomiting blood ?Gastroesophageal reflux/heartburn ?Abdominal pain  Genitourinary: ?Chronic kidney disease ?Difficult urination ?Frequent urination ?Burning with urination ?Hematuria  Skin: ?Rashes ?Ulcers ?Wounds  Psychological: ?History of anxiety ? History of major depression.  Physical Examination  BP (!) 162/87  Pulse 49  Ht 6' (1.829 m)  Wt 163 lb (73.9 kg)  BMI 22.11 kg/m  Gen: WD/WN, NAD  Head: [place]/AT, No temporalis wasting.  Ear/Nose/Throat: Hearing diminished, nares w/o erythema or drainage  Eyes: Conjunctiva injected. Sclera non-icteric  Neck: Supple. Trachea midline  Pulmonary: Good air movement, no use of accessory muscles.  Cardiac: Irregular   Vascular:  Vessel Right Left  Radial Palpable Palpable                          PT not palpable not palpable  DP not palpable not palpable   Gastrointestinal: soft, non-tender/non-distended.  Musculoskeletal: M/S 5/5 throughout. No deformity or atrophy. No edema.  Neurologic: Sensation grossly  intact in extremities. Symmetrical. Speech is fluent.  Psychiatric: Judgment intact, Mood & affect appropriate for pt's clinical situation.  Dermatologic: No rashes or ulcers noted. No cellulitis or open wounds.   Labs  No results found for this or any previous visit (from the past 2160 hour(s)).  Radiology  Imaging Results    Assessment/Plan   1) Essential hypertension  blood pressure control important in reducing the progression of atherosclerotic disease. On appropriate oral medications.   2) Controlled type 2 diabetes mellitus without complication (HCC)  blood glucose control important in reducing the progression of atherosclerotic disease. Also, involved in wound healing. On appropriate medications.   3) Hyperlipidemia  lipid control important in reducing the progression  of atherosclerotic disease. Continue statin therapy   4) AAA (abdominal aortic aneurysm) ([place])  He had an abdominal aortic aneurysm seen on a CT scan 4 years ago that was a very complex aneurysm with large amounts of mural thrombus. His left common iliac artery was occluded. His right external iliac artery was stenotic. This was also a juxtarenal aneurysm and not 1 that would likely be amenable to endovascular therapy for aneurysm repair in and of itself. We are going to be doing an angiogram in the near future and may get a better feel for his anatomy of the aneurysm as well.   5) Atrial fibrillation, new onset ([place])  On anticoagulation and rate controlled   6) Atherosclerosis of native arteries of extremity with rest pain (HCC)   His right ABI is critically low at 0.15 with dampened monophasic waveform in the anterior tibial artery and no detectable flow in the posterior tibial artery.  Plan for right femoral endarterectomy with Dew on Wed Will need cards clearance  Discussed with Eliot Ford PA-C 05/30/19 12:37pm

## 2019-05-27 NOTE — Op Note (Signed)
[facility]  Percutaneous Study/Intervention Procedural Note   Date of Surgery: 05/27/2019  Surgeon(s):Roshan Roback    Assistants:none  Pre-operative Diagnosis: PAD with rest Curtis right leg  Post-operative diagnosis:  Same  Procedure(s) Performed:             1.  Ultrasound guidance for vascular access bilateral femoral arteries             2.  Catheter placement into left common iliac artery from left femoral approach and into the aorta from right femoral approach             3.  Aortogram and selective right lower extremity angiogram             4.  StarClose closure device left femoral artery  EBL: 10 cc  Contrast: 45 cc  Fluoro Time: 5.7 minutes  Moderate Conscious Sedation Time: approximately 25 minutes using 3 mg of Versed and 50 mcg of Fentanyl              Indications:  Patient is a 86 y.o.male with worsening Curtis of the right leg now consistent with rest Curtis. The patient has noninvasive study showing an ABI of 0.15 on the right.  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 The patient is brought in for angiography for further evaluation and potential treatment.  Due to the limb threatening nature of the situation, angiogram was performed for attempted limb salvage. The patient is aware that if the procedure fails, amputation would be expected.  The patient also understands that even with successful revascularization, amputation may still be required due to the severity of the situation. Risks and benefits are discussed and informed consent is obtained.   Procedure:  The patient was identified and appropriate procedural time out was performed.  The patient was then placed supine on the table and prepped and draped in the usual sterile fashion. Moderate conscious sedation was administered during a face to face encounter with the patient throughout the  procedure with my supervision of the RN administering medicines and monitoring the patient's vital signs, pulse oximetry, telemetry and mental status throughout from the start of the procedure until the patient was taken to the recovery room. Ultrasound was used to evaluate the left common femoral artery.  It was patent but diseased.  A digital ultrasound image was acquired.  A Seldinger needle was used to access the left common femoral artery under direct ultrasound guidance and a permanent image was performed.  A 0.035 J wire was advanced without resistance and a 5Fr sheath was placed.   Imaging performed through the sheath showed the left external iliac artery to be somewhat small but patent and flow was out the hypogastric artery with a chronic occlusion of the common iliac artery.  Attempts to cross the left common iliac artery occlusion with a Kumpe catheter and advantage wire were unsuccessful and so I abandoned this attempt.  He does not have any significant left leg symptoms at this time that would require treatment.  I then turned my attention to accessing the right groin.  The right femoral artery was heavily diseased and I accessed the mid right common femoral artery with a micropuncture needle under direct ultrasound guidance and a permanent image was recorded.  A micropuncture wire and sheath were then placed.  I upsized to a 5 [place]  sheath on the right.  Pigtail catheter was placed into the aorta from the right side and an AP aortogram was performed. This demonstrated the juxtarenal abdominal aortic aneurysm that appeared to be moderate in size.  There was a tube graft sewn into this starting about 2 to 3 cm below the renal arteries.  The right renal artery was seen and appeared to be patent.  The left renal artery filled faintly, but this may have been catheter position.  There was then a napkin ringlike stenosis in the 60% range at the bottom of the old tube graft to the proximal right common iliac  artery which was heavily diseased.  The right hypogastric artery was occluded.  The right external iliac artery had to highly calcific stenoses with the lower of the 2 being the more severe in the 80 to 85% range.  I then remove the pigtail catheter and image the right lower extremity through the right femoral sheath.  This demonstrated occlusion of the right common femoral artery in the proximal portions of the SFA and profunda femoris arteries.  The SFA was then diffusely diseased with multiple areas of high-grade stenosis or occlusion of a short segments down to the above-knee popliteal artery where the vessel seem to normalize.  The below-knee popliteal artery seemed relatively normal.  The anterior tibial artery was chronically occluded.  The tibioperoneal trunk fed both peroneal and the posterior tibial artery.  The posterior tibial artery seem to have multiple segments of at least moderate stenosis but opacification was quite poor due to the poor inflow.  The peroneal artery was small but did provide a second runoff vessel distally.  Determination of whether or not there was stenosis was difficult with the poor inflow.  I felt it was in the patient's best interest to performed a combined right femoral endarterectomy with both infra inguinal and aortoiliac intervention concomitantly from the right femoral approach at that setting.  A large sheath would be required to treat the very generous right iliac vessels and infrainguinal access was not available through a retrograde right femoral approach.  I elected to terminate the procedure. The sheath was removed and StarClose closure device was deployed in the left femoral artery with excellent hemostatic result.  Manual pressure was held on the right femoral artery for 15 minutes with excellent hemostatic result.  The patient was taken to the recovery room in stable condition having tolerated the procedure well.  Findings:               Aortogram:  This  demonstrated the juxtarenal abdominal aortic aneurysm that appeared to be moderate in size.  There was a tube graft sewn into this starting about 2 to 3 cm below the renal arteries.  The right renal artery was seen and appeared to be patent.  The left renal artery filled faintly, but this may have been catheter position.  There was then a napkin ringlike stenosis in the 60% range at the bottom of the old tube graft to the proximal right common iliac artery which was heavily diseased.  The right hypogastric artery was occluded.  The right external iliac artery had to highly calcific stenoses with the lower of the 2 being the more severe in the 80 to 85% range.             Right Lower Extremity:  Occlusion of the right common femoral artery in the proximal portions of the SFA and profunda femoris arteries.  The SFA was  then diffusely diseased with multiple areas of high-grade stenosis or occlusion of a short segments down to the above-knee popliteal artery where the vessel seem to normalize.  The below-knee popliteal artery seemed relatively normal.  The anterior tibial artery was chronically occluded.  The tibioperoneal trunk fed both peroneal and the posterior tibial artery.  The posterior tibial artery seem to have multiple segments of at least moderate stenosis but opacification was quite poor due to the poor inflow.  The peroneal artery was small but did provide a second runoff vessel distally.  Determination of whether or not there was stenosis was difficult with the poor inflow   Disposition: Patient was taken to the recovery room in stable condition having tolerated the procedure well.  Complications: None  [name] 05/27/2019 11:38 AM   This note was created with Dragon Medical transcription system. Any errors in dictation are purely unintentional.

## 2019-05-27 NOTE — Consult Note (Signed)
CARDIOLOGY CONSULT NOTE               Patient ID: [name] MRN: [id number] DOB/AGE: 84-Jan-1932 84 y.o.  Admit date: 05/27/2019 Referring Physician Leotis Pain, MD Primary Physician Edwina Barth, MD Primary Cardiologist [facility], MD Reason for Consultation Preoperative cardiovascular evaluation   HPI: 86 year old gentleman referred for preoperative cardiovascular evaluation prior to right femoral endarterectomy scheduled for 05/29/2019. The patient has a history of paroxysmal atrial fibrillation on Eliquis for stroke prevention (last dose 05/24/19) and diltiazem for rate control, type 2 diabetes, essential hypertension, hyperlipidemia, AAA, TIA, CKD stage II, mitral regurgitation, and coronary artery disease status post CABG in 1990. The patient reports that he has been doing well with the exception of exertional claudication. He denies chest pain, shortness of breath, orthopnea, or palpitations. He does yard work and is able to walk to his mailbox without exertional chest pain or shortness of breath. Admission ECG revealed atrial fibrillation at a rate of 38 bpm with LVH and IVCD. Labs notable for BUN 30, creatinine 1.76.  Review of systems complete and found to be negative unless listed above     Past Medical History:  Diagnosis Date  . AAA (abdominal aortic aneurysm) ([place])   . Arthritis    in fingers and hands  . Cancer ([place])    skin cancer removed several  . Diabetes mellitus without complication ([place])   . Dysrhythmia    atrial fibrillation.  on eliquis  . History of kidney stones   . Hypertension   . Mitral insufficiency    per dr. Clayborn Bigness  . Myocardial infarct ([place]) 1990  . Transfusion history 1990   received several units of blood after CABG    Past Surgical History:  Procedure Laterality Date  . ABDOMINAL AORTIC ANEURYSM REPAIR  2005, [facility]  . Cardiac bypass  1990   [facility]. triple cabg  . CARDIAC CATHETERIZATION  1990   stents placed but ultimately clotted and shut  down.  Marland Kitchen CARDIAC SURGERY  0000000   duplicate entry  . CHOLECYSTECTOMY N/A 11/10/2008   Dr Bary Castilla  . CORONARY ARTERY BYPASS GRAFT  1990   x 3. performed after coronary stents failed  . EYE SURGERY Bilateral    cataract extractions with iol  . INGUINAL HERNIA REPAIR Left 08/07/2017   Medium Ultra Pro mesh Surgeon: Robert Bellow, MD;  Location: [facility] ORS;  Service: General;  Laterality: Left;    Medications Prior to Admission  Medication Sig Dispense Refill Last Dose  . atorvastatin (LIPITOR) 40 MG tablet Take 40 mg by mouth daily.   05/27/2019 at Unknown time  . Cholecalciferol (VITAMIN D3) 5000 units CAPS Take 5,000 Units by mouth daily.   05/27/2019 at Unknown time  . diltiazem (TIAZAC) 180 MG 24 hr capsule Take by mouth.   05/27/2019 at Unknown time  . lisinopril-hydrochlorothiazide (PRINZIDE,ZESTORETIC) 20-12.5 MG tablet Take 1 tablet by mouth 2 (two) times daily.    05/27/2019 at Unknown time  . Omega-3 Fatty Acids (FISH OIL) 1000 MG CAPS Take 1,000 mg by mouth daily.    05/26/2019 at Unknown time  . tetrahydrozoline (GOODSENSE EYE DROPS) 0.05 % ophthalmic solution Place 1 drop into both eyes daily as needed (dry eyes).   05/27/2019 at Unknown time  . vitamin B-12 (CYANOCOBALAMIN) 1000 MCG tablet Take 1,000 mcg by mouth 2 (two) times a week.   05/27/2019 at Unknown time  . diltiazem (CARDIZEM CD) 120 MG 24 hr capsule Take 120 mg by mouth twice daily  Not Taking at Unknown time  . ELIQUIS 5 MG TABS tablet Take 1 tablet (5 mg total) by mouth 2 (two) times daily. 1 tablet 0 05/23/2019  . glucose blood (BAYER CONTOUR TEST) test strip 1 strip by Does not apply route 3 (three) times daily.     Marland Kitchen triamcinolone cream (KENALOG) 0.5 % Apply topically.      Social History   Socioeconomic History  . Marital status: Widowed    Spouse name: Not on file  . Number of children: Not on file  . Years of education: Not on file  . Highest education level: Not on file  Occupational History  . Not on file    Tobacco Use  . Smoking status: Former Smoker    Years: 30.00    Types: Cigarettes    Quit date: 01/25/1988    Years since quitting: 31.3  . Smokeless tobacco: Never Used  Substance and Sexual Activity  . Alcohol use: No  . Drug use: No  . Sexual activity: Not Currently  Other Topics Concern  . Not on file  Social History Narrative  . Not on file   Social Determinants of Health   Financial Resource Strain:   . Difficulty of Paying Living Expenses:   Food Insecurity:   . Worried About Charity fundraiser in the Last Year:   . Arboriculturist in the Last Year:   Transportation Needs:   . Film/video editor (Medical):   Marland Kitchen Lack of Transportation (Non-Medical):   Physical Activity:   . Days of Exercise per Week:   . Minutes of Exercise per Session:   Stress:   . Feeling of Stress :   Social Connections:   . Frequency of Communication with Friends and Family:   . Frequency of Social Gatherings with Friends and Family:   . Attends Religious Services:   . Active Member of Clubs or Organizations:   . Attends Archivist Meetings:   Marland Kitchen Marital Status:   Intimate Partner Violence:   . Fear of Current or Ex-Partner:   . Emotionally Abused:   Marland Kitchen Physically Abused:   . Sexually Abused:     Family History  Problem Relation Age of Onset  . Cerebral aneurysm Mother        died at 23  . CAD Father        died at 75      Review of systems complete and found to be negative unless listed above      PHYSICAL EXAM  General: Well developed, well nourished, in no acute distress, lying in bed, appears comfortable HEENT:  Normocephalic and atramatic Neck:  No JVD.  Lungs: normal effort of breathing on room air Heart: irregular, bradycardic. Normal S1 and S2 wihout gallops or murmurs. t Abdomen: nondistended Msk:  Gait not assessed. No obvious deformities. Extremities: No edema.   Neuro: Alert and oriented X 3. Psych:  Good affect, responds appropriately  Labs:    Lab Results  Component Value Date   WBC 7.2 05/20/2016   HGB 14.1 05/20/2016   HCT 42.2 05/20/2016   MCV 92.7 05/20/2016   PLT 191 05/20/2016    Recent Labs  Lab 05/27/19 0900  BUN 30*  CREATININE 1.76*   Lab Results  Component Value Date   CKTOTAL 57 12/20/2011   CKMB < 0.5 (L) 12/20/2011   TROPONINI 0.03 (HH) 05/21/2016   No results found for: CHOL No results found for: HDL No results found  for: [place] No results found for: TRIG No results found for: CHOLHDL No results found for: LDLDIRECT    Radiology: PERIPHERAL VASCULAR CATHETERIZATION  Result Date: 05/27/2019 See op note  DG Chest Port 1 View  Result Date: 05/27/2019 CLINICAL DATA:  Pre-op for femoral endarterectomy. EXAM: PORTABLE CHEST 1 VIEW COMPARISON:  01/12/2016 and 12/26/2014. FINDINGS: 1210 hours. The heart size and mediastinal contours are stable status post median sternotomy. There is aortic atherosclerosis. Chronic elevation of the left hemidiaphragm appears similar to the previous study. There is no edema, confluent airspace opacity, pleural effusion or pneumothorax. No acute osseous findings are seen. IMPRESSION: Stable postoperative chest.  No acute cardiopulmonary process. Electronically Signed   By: Richardean Sale M.D.   On: 05/27/2019 12:21   VAS [place] ABI WITH/WO TBI  Result Date: 05/21/2019 LOWER EXTREMITY DOPPLER STUDY Indications: Claudication, and peripheral artery disease.  Vascular Interventions: 06/29/2015: CT Chronically occluded Lt CIA, High Grade                         Rt EIA Stenosis. Comparison Study: 03/31/2016 Performing Technologist: Almira Coaster RVS  Examination Guidelines: A complete evaluation includes at minimum, Doppler waveform signals and systolic blood pressure reading at the level of bilateral brachial, anterior tibial, and posterior tibial arteries, when vessel segments are accessible. Bilateral testing is considered an integral part of a complete examination. Photoelectric  Plethysmograph (PPG) waveforms and toe systolic pressure readings are included as required and additional duplex testing as needed. Limited examinations for reoccurring indications may be performed as noted.  ABI Findings: +---------+------------------+-----+-------------------+--------+ Right    Rt Pressure (mmHg)IndexWaveform           Comment  +---------+------------------+-----+-------------------+--------+ Brachial 164                                                +---------+------------------+-----+-------------------+--------+ ATA      24                0.15 dampened monophasic         +---------+------------------+-----+-------------------+--------+ PTA      0                 0.00 absent                      +---------+------------------+-----+-------------------+--------+ Great Toe                                                   +---------+------------------+-----+-------------------+--------+ +---------+------------------+-----+----------+-------+ Left     Lt Pressure (mmHg)IndexWaveform  Comment +---------+------------------+-----+----------+-------+ Brachial 153                                      +---------+------------------+-----+----------+-------+ ATA      62                0.38 monophasic        +---------+------------------+-----+----------+-------+ PTA      75                0.46 monophasic        +---------+------------------+-----+----------+-------+ [place] Hoff  0.25 Abnormal          +---------+------------------+-----+----------+-------+ +-------+-----------+-----------+------------+------------+ ABI/TBIToday's ABIToday's TBIPrevious ABIPrevious TBI +-------+-----------+-----------+------------+------------+ Right  .15        .48        .44         .33          +-------+-----------+-----------+------------+------------+ Left   .46        .25        .51         .28           +-------+-----------+-----------+------------+------------+ Bilateral ABIs appear decreased compared to prior study on 03/31/2016. Right TBIs appear increased compared to prior study on 03/31/2016. Lt TBIs appear essentially unchanged compared to prior study on 03/31/2016.  Summary: Right: Resting right ankle-brachial index indicates critical limb ischemia. The right toe-brachial index is abnormal. Imaging and Waveforms obtained of the Rt PTA,ATA and Peroneal Artery. Rt PTA appears to be occluded. Left: Resting left ankle-brachial index indicates severe left lower extremity arterial disease. The left toe-brachial index is abnormal. Imaging and Waveforms obtained of the Lt PTA,ATA and Peroneal Artery.  *See table(s) above for measurements and observations.  Electronically signed by Leotis Pain MD on 05/21/2019 at 1:04:46 PM.    Final     EKG: atrial fibrillation, 38 bpm  ASSESSMENT AND PLAN:  1. Preoperative cardiovascular evaluation prior to right femoral endarterectomy scheduled for 05/29/2019. The patient has been medically optimized and is without chest pain or shortness of breath. 2. Bradycardia, no known prior history. Heart rate 38 bpm. On diltiazem. 3. Paroxysmal atrial fibrillation, on Eliquis for stroke prevention and diltiazem for rate control, asymptomatic. 4. Coronary artery disease, status post CABG in 1990 5. History of TIA 6. Peripheral vascular disease 7. AAA, status post repair  Recommendations: 1. Monitor on telemetry 2. 2D echocardiogram 3. Defer cardiac catheterization in the absence of chest pain or shortness of breath, and in light of CKD with creatinine 1.76, and risk of contrast-induced nephrotoxicity.  4. Discontinue diltiazem as patient markedly bradycardic. 5. Continue to hold Eliquis in anticipation of procedure. 6. Further recommendations pending patient's initial course.  Signed: Clabe Seal PA-C 05/27/2019, 2:28 PM  Discussed with Dr. Saralyn Pilar who agrees with above  plan.

## 2019-05-27 NOTE — H&P (Signed)
[facility] History & Physical Update  The patient was interviewed and re-examined.  The patient's previous History and Physical has been reviewed and is unchanged.  There is no change in the plan of care. We plan to proceed with the scheduled procedure.  Leotis Pain, MD  05/27/2019, 8:21 AM

## 2019-05-27 NOTE — Progress Notes (Addendum)
Doppler pulse checks performed. No falls. Bed alarm on for fall prevention. The patient has been stable no pain. On a cadiac monitor for heart rate that ranges in the 30's- 40's at times. Surgeon aware and cardiology have seen the patient. The patient will continue to be monitored. No bleeding from bilateral groin sites. Dressing in groin are intact. Blood pressure stable. BP meds held to prevent further drop of heart rate at this time. NP has been notified.

## 2019-05-28 ENCOUNTER — Other Ambulatory Visit (INDEPENDENT_AMBULATORY_CARE_PROVIDER_SITE_OTHER): Payer: Self-pay | Admitting: Vascular Surgery

## 2019-05-28 DIAGNOSIS — I70229 Atherosclerosis of native arteries of extremities with rest pain, unspecified extremity: Secondary | ICD-10-CM

## 2019-05-28 DIAGNOSIS — Z01818 Encounter for other preprocedural examination: Secondary | ICD-10-CM

## 2019-05-28 LAB — HEMOGLOBIN A1C
Hgb A1c MFr Bld: 6.9 % — ABNORMAL HIGH (ref 4.8–5.6)
Mean Plasma Glucose: 151.33 mg/dL

## 2019-05-28 LAB — BASIC METABOLIC PANEL
Anion gap: 9 (ref 5–15)
BUN: 26 mg/dL — ABNORMAL HIGH (ref 8–23)
CO2: 27 mmol/L (ref 22–32)
Calcium: 9.3 mg/dL (ref 8.9–10.3)
Chloride: 103 mmol/L (ref 98–111)
Creatinine, Ser: 1.52 mg/dL — ABNORMAL HIGH (ref 0.61–1.24)
GFR calc Af Amer: 47 mL/min — ABNORMAL LOW (ref >60)
GFR calc non Af Amer: 41 mL/min — ABNORMAL LOW (ref >60)
Glucose, Bld: 129 mg/dL — ABNORMAL HIGH (ref 70–99)
Potassium: 3.7 mmol/L (ref 3.5–5.1)
Sodium: 139 mmol/L (ref 135–145)

## 2019-05-28 LAB — ECHOCARDIOGRAM COMPLETE
Height: 72 in
Weight: 2480 oz

## 2019-05-28 LAB — GLUCOSE, CAPILLARY
Glucose-Capillary: 143 mg/dL — ABNORMAL HIGH (ref 70–99)
Glucose-Capillary: 170 mg/dL — ABNORMAL HIGH (ref 70–99)
Glucose-Capillary: 104 mg/dL — ABNORMAL HIGH (ref 70–99)
Glucose-Capillary: 133 mg/dL — ABNORMAL HIGH (ref 70–99)

## 2019-05-28 LAB — CBC
HCT: 35.8 % — ABNORMAL LOW (ref 39.0–52.0)
Hemoglobin: 11.9 g/dL — ABNORMAL LOW (ref 13.0–17.0)
MCH: 30.4 pg (ref 26.0–34.0)
MCHC: 33.2 g/dL (ref 30.0–36.0)
MCV: 91.6 fL (ref 80.0–100.0)
Platelets: 143 10*3/uL — ABNORMAL LOW (ref 150–400)
RBC: 3.91 MIL/uL — ABNORMAL LOW (ref 4.22–5.81)
RDW: 13.5 % (ref 11.5–15.5)
WBC: 5.9 10*3/uL (ref 4.0–10.5)
nRBC: 0 % (ref 0.0–0.2)

## 2019-05-28 LAB — ABO/RH: ABO/RH(D): A POS

## 2019-05-28 LAB — MAGNESIUM: Magnesium: 1.7 mg/dL (ref 1.7–2.4)

## 2019-05-28 MED ORDER — DOCUSATE SODIUM 100 MG PO CAPS
100.0000 mg | ORAL_CAPSULE | Freq: Two times a day (BID) | ORAL | Status: DC
  Administered 2019-05-28 – 2019-05-30 (×3): 100 mg via ORAL
  Filled 2019-05-28 (×3): qty 1

## 2019-05-28 MED ORDER — METOPROLOL SUCCINATE ER 25 MG PO TB24
12.5000 mg | ORAL_TABLET | Freq: Every day | ORAL | Status: DC
  Administered 2019-05-28 – 2019-05-30 (×2): 12.5 mg via ORAL
  Filled 2019-05-28: qty 0.5
  Filled 2019-05-28: qty 1
  Filled 2019-05-28: qty 0.5

## 2019-05-28 MED ORDER — TRIAMCINOLONE ACETONIDE 0.5 % EX CREA
TOPICAL_CREAM | Freq: Two times a day (BID) | CUTANEOUS | Status: DC | PRN
  Filled 2019-05-28 (×2): qty 15

## 2019-05-28 MED ORDER — MAGNESIUM SULFATE 2 GM/50ML IV SOLN
2.0000 g | Freq: Once | INTRAVENOUS | Status: AC
  Administered 2019-05-28: 2 g via INTRAVENOUS
  Filled 2019-05-28: qty 50

## 2019-05-28 MED ORDER — SODIUM CHLORIDE 0.9 % IV SOLN: INTRAVENOUS | Status: DC

## 2019-05-28 MED ORDER — POLYETHYLENE GLYCOL 3350 17 G PO PACK: 17.0000 g | PACK | Freq: Every day | ORAL | Status: DC | PRN

## 2019-05-28 NOTE — Progress Notes (Signed)
2D echocardiogram reveals mildly reduced left ventricular systolic function with LVEF 40-45% with mild to moderate TR and MR.  Metoprolol succinate 12.5 mg daily for rate control, cardioprotection, ischemic cardiomyopathy and to prevent reflex tachycardia. Bradycardia has resolved. Continue perioperatively.  Proceed with femoral endarterectomy if deemed medically necessary. The patient has been medically optimizedand is without chest pain or shortness of breath. The patient has a history of questionable MI, coronary artery disease, TIA, CKD, and is at an advanced age, and would be a moderate risk for serious cardiovascular complications when undergoing this surgery with general anesthesia.

## 2019-05-28 NOTE — Progress Notes (Signed)
[facility] Daily Progress Note   Procedures: 05/27/19: 1. Ultrasound guidance for vascular access bilateral femoral arteries 2. Catheter placement into left common iliac artery from left femoral approach and into the aorta from right femoral approach 3. Aortogram and selective right lower extremity angiogram 4.  StarClose closure device left femoral artery  Subjective: Patient without complaint this AM.  No issues overnight.  Objective: Vitals:   05/27/19 1652 05/27/19 2028 05/28/19 0558 05/28/19 0912  BP: 134/63 140/64 123/78 137/77  Pulse: (!) 54 (!) 50 60 79  Resp:  18 18   Temp: 98 F (36.7 C) 98.1 F (36.7 C) 98.7 F (37.1 C) 97.8 F (36.6 C)  TempSrc: Oral Oral Oral Oral  SpO2: 99% 98% 97% 100%  Weight:      Height:        Intake/Output Summary (Last 24 hours) at 05/28/2019 1011 Last data filed at 05/28/2019 0119 Gross per 24 hour  Intake 0 ml  Output 250 ml  Net -250 ml   Physical Exam: A&Ox3, NAD CV: Irregularly irregular Pulmonary: CTA Bilaterally Abdomen: Soft, Nontender, Nondistended, (+) Bowel sounds Right groin:  Access site: Dressing intact clean and dry.  No drainage or swelling. Vascular:  Left lower extremity: Thigh soft.  Calf soft.  Extremity distally to toes.  Hard to palpate pedal pulses.  Motor/sensory is intact.   Laboratory: CBC    Component Value Date/Time   WBC 5.9 05/28/2019 0508   HGB 11.9 (L) 05/28/2019 0508   HGB WILL FOLLOW 03/18/2015 1115   HGB 13.3 03/18/2015 1115   HCT 35.8 (L) 05/28/2019 0508   HCT WILL FOLLOW 03/18/2015 1115   HCT 39.7 03/18/2015 1115   PLT 143 (L) 05/28/2019 0508   PLT WILL FOLLOW 03/18/2015 1115   PLT 161 03/18/2015 1115   BMET    Component Value Date/Time   NA 139 05/28/2019 0508   NA 141 03/18/2015 1115   NA 144 12/20/2011 0056   K 3.7 05/28/2019 0508   K 3.3 (L) 12/20/2011 0056   CL 103 05/28/2019 0508   CL 108 (H)  12/20/2011 0056   CO2 27 05/28/2019 0508   CO2 27 12/20/2011 0056   GLUCOSE 129 (H) 05/28/2019 0508   GLUCOSE 158 (H) 12/20/2011 0056   BUN 26 (H) 05/28/2019 0508   BUN 28 (H) 03/18/2015 1115   BUN 25 (H) 12/20/2011 0056   CREATININE 1.52 (H) 05/28/2019 0508   CREATININE 1.54 (H) 12/20/2011 0056   CALCIUM 9.3 05/28/2019 0508   CALCIUM 8.8 12/20/2011 0056   GFRNONAA 41 (L) 05/28/2019 0508   GFRNONAA 42 (L) 12/20/2011 0056   GFRAA 47 (L) 05/28/2019 0508   GFRAA 49 (L) 12/20/2011 0056   Assessment/Planning: Patient is a 86 year old male with known history of peripheral artery disease who presents with worsening pain of the right leg now consistent with rest pain. The patient underwent noninvasive ABI of 0.15 on the right.   1) Atherosclerotic disease with rest pain to the right lower extremity: - Status post right lower extremity endovascular intervention - POD#1 Findings: There was then a napkin ringlike stenosis in the 60% range at the bottom of the old tube graft to the proximal right common iliac artery which was heavily diseased.  The right hypogastric artery was occluded.  The right external iliac artery had to highly calcific stenoses with the lower of the two being the more severe in the 80 to 85% range. Occlusion of the right common  femoral artery in the proximal portions of the SFA and profunda femoris arteries.  The SFA was then diffusely diseased with multiple areas of high-grade stenosis or occlusion of a short segments down to the above-knee popliteal artery where the vessel seem to normalize.  The below-knee popliteal artery seemed relatively normal.  The anterior tibial artery was chronically occluded.  The tibioperoneal trunk fed both peroneal and the posterior tibial artery.  The posterior tibial artery seem to have multiple segments of at least moderate stenosis but opacification was quite poor due to the poor inflow.  The peroneal artery was small but did provide a second  runoff vessel distally. - Plan for right femoral endarterectomy with endovascular intervention tomorrow with Lucky Cowboy / Schnier - Discussed tomorrow surgery at length with patient. Procedure, risks and benefits explained to the patient.  All questions answered.  The patient wishes to proceed. - On aspirin and statin for medical management. - Prep op for tomorrow  2) Pending cardiac clearance: - Appreciate cardiology recommendations - Pending echocardiogram results - Pending final clearance recommendations - Will restart Eliquis after surgery, most likely POD #1/2  Discussed with Dr. Ellis Parents Ozzie Remmers PA-C 05/28/2019 10:11 AM

## 2019-05-28 NOTE — Progress Notes (Signed)
[facility] Cardiology    SUBJECTIVE: The patient reports doing well this morning with no complaints. He denies chest pain, shortness of breath, palpitations, lightheadedness, or dizziness. He has washed up at the sink this morning and is currently eating breakfast.   Vitals:   05/27/19 1355 05/27/19 1652 05/27/19 2028 05/28/19 0558  BP: (!) 125/50 134/63 140/64 123/78  Pulse: (!) 53 (!) 54 (!) 50 60  Resp: 17  18 18   Temp: 97.7 F (36.5 C) 98 F (36.7 C) 98.1 F (36.7 C) 98.7 F (37.1 C)  TempSrc: Oral Oral Oral Oral  SpO2: 99% 99% 98% 97%  Weight:      Height:         Intake/Output Summary (Last 24 hours) at 05/28/2019 0749 Last data filed at 05/28/2019 0119 Gross per 24 hour  Intake 0 ml  Output 250 ml  Net -250 ml      PHYSICAL EXAM  General: Well developed, well nourished, in no acute distress, elderly gentleman, sitting in recliner eating breakfast HEENT:  Normocephalic and atramatic Neck:  No JVD.  Lungs: normal effort of breathing on room air. Heart: irregularly irregular . Normal S1 and S2 without gallops or murmurs.  Msk:  Back normal, gait not assessed.  Extremities: No clubbing, cyanosis or edema.   Neuro: Alert and oriented X 3. Psych:  Good affect, responds appropriately   LABS: Basic Metabolic Panel: Recent Labs    05/27/19 0900 05/28/19 0508  NA  --  139  K  --  3.7  CL  --  103  CO2  --  27  GLUCOSE  --  129*  BUN 30* 26*  CREATININE 1.76* 1.52*  CALCIUM  --  9.3  MG  --  1.7   Liver Function Tests: No results for input(s): AST, ALT, ALKPHOS, BILITOT, PROT, ALBUMIN in the last 72 hours. No results for input(s): LIPASE, AMYLASE in the last 72 hours. CBC: Recent Labs    05/28/19 0508  WBC 5.9  HGB 11.9*  HCT 35.8*  MCV 91.6  PLT 143*   Cardiac Enzymes: No results for input(s): CKTOTAL, CKMB, CKMBINDEX, TROPONINI in the last 72 hours. BNP: Invalid input(s): POCBNP D-Dimer: No results for input(s): DDIMER in the last 72  hours. Hemoglobin A1C: No results for input(s): HGBA1C in the last 72 hours. Fasting Lipid Panel: No results for input(s): CHOL, HDL, LDLCALC, TRIG, CHOLHDL, LDLDIRECT in the last 72 hours. Thyroid Function Tests: No results for input(s): TSH, T4TOTAL, T3FREE, THYROIDAB in the last 72 hours.  Invalid input(s): FREET3 Anemia Panel: No results for input(s): VITAMINB12, FOLATE, FERRITIN, TIBC, IRON, RETICCTPCT in the last 72 hours.  PERIPHERAL VASCULAR CATHETERIZATION  Result Date: 05/27/2019 See op note  DG Chest Port 1 View  Result Date: 05/27/2019 CLINICAL DATA:  Pre-op for femoral endarterectomy. EXAM: PORTABLE CHEST 1 VIEW COMPARISON:  01/12/2016 and 12/26/2014. FINDINGS: 1210 hours. The heart size and mediastinal contours are stable status post median sternotomy. There is aortic atherosclerosis. Chronic elevation of the left hemidiaphragm appears similar to the previous study. There is no edema, confluent airspace opacity, pleural effusion or pneumothorax. No acute osseous findings are seen. IMPRESSION: Stable postoperative chest.  No acute cardiopulmonary process. Electronically Signed   By: Richardean Sale M.D.   On: 05/27/2019 12:21     Echo pending  TELEMETRY: atrial fibrillation, rate ranging 60-90s  ASSESSMENT AND PLAN:  Active Problems:   Atherosclerosis of native arteries of extremity with rest pain (H[facility])   Atherosclerotic peripheral vascular  disease with rest pain ([place])    ASSESSMENT AND PLAN:  1. Preoperative cardiovascular evaluation prior to right femoral endarterectomy scheduled for 05/29/2019. The patient has been medically optimized and is without chest pain or shortness of breath. The patient has a history of questionable MI, coronary artery disease, TIA, CKD, and is at an advanced age, and would be a moderate risk for serious cardiovascular complications when undergoing this surgery with general anesthesia. 2. Bradycardia, no known prior history. Heart rate was in  30-40s while on diltiazem, which is currently being held. Heart rate 50-60s at rest and 70-90s with minimal activity. Patient asymptomatic. 3. Paroxysmal atrial fibrillation, on Eliquis for stroke prevention, last dose 4/30; appears asymptomatic. 4. Coronary artery disease, status post CABG in 1990 5. History of TIA 6. Peripheral vascular disease 7. AAA, status post repair  Recommendations: 1. Review 2D echocardiogram 2. Continue to hold diltiazem in light of bradycardia. Will consider adding low dose metoprolol succinate perioperatively if blood pressure/heart rate permits. 3. Continue to hold Eliquis in anticipation of procedure 4. Continue to monitor on telemetry 5. Further recommendations pending how patient does today and results of echocardiogram   Clabe Seal, PA-C 05/28/2019 7:49 AM  Discussed with Dr. Saralyn Pilar; plan made in collaboration with him.

## 2019-05-28 NOTE — Plan of Care (Signed)
The patient has been stable. Heart rate has sustain around the 60's. Heart rate rate recorded at noon 125 yet the patient had just returned from the bathroom when vital signs had been obtained. Consent was signed and has been placed in the chart. Education performed and educational sheets provided for current surgical procedure for tomorrow. Bed alarm on. No bleeding from bilateral groins. Doppler checks performed.  Problem: Education: Goal: Knowledge of General Education information will improve Description: Including pain rating scale, medication(s)/side effects and non-pharmacologic comfort measures Outcome: Progressing   Problem: Health Behavior/Discharge Planning: Goal: Ability to manage health-related needs will improve Outcome: Progressing   Problem: Clinical Measurements: Goal: Ability to maintain clinical measurements within normal limits will improve Outcome: Progressing Goal: Will remain free from infection Outcome: Progressing Goal: Diagnostic test results will improve Outcome: Progressing Goal: Respiratory complications will improve Outcome: Progressing Goal: Cardiovascular complication will be avoided Outcome: Progressing   Problem: Activity: Goal: Risk for activity intolerance will decrease Outcome: Progressing   Problem: Nutrition: Goal: Adequate nutrition will be maintained Outcome: Progressing   Problem: Coping: Goal: Level of anxiety will decrease Outcome: Progressing   Problem: Elimination: Goal: Will not experience complications related to bowel motility Outcome: Progressing Goal: Will not experience complications related to urinary retention Outcome: Progressing   Problem: Pain Managment: Goal: General experience of comfort will improve Outcome: Progressing   Problem: Safety: Goal: Ability to remain free from injury will improve Outcome: Progressing   Problem: Skin Integrity: Goal: Risk for impaired skin integrity will decrease Outcome:  Progressing

## 2019-05-29 ENCOUNTER — Inpatient Hospital Stay: Payer: Medicare PPO | Admitting: Anesthesiology

## 2019-05-29 ENCOUNTER — Encounter: Payer: Self-pay | Admitting: Vascular Surgery

## 2019-05-29 ENCOUNTER — Encounter
Admission: AD | Disposition: A | Discharge status: Home / Self Care | Payer: Self-pay | Source: Home / Self Care | Attending: Vascular Surgery

## 2019-05-29 ENCOUNTER — Other Ambulatory Visit: Payer: Self-pay

## 2019-05-29 ENCOUNTER — Inpatient Hospital Stay: Payer: Medicare PPO

## 2019-05-29 DIAGNOSIS — I700 Atherosclerosis of aorta: Secondary | ICD-10-CM

## 2019-05-29 DIAGNOSIS — I70221 Atherosclerosis of native arteries of extremities with rest pain, right leg: Secondary | ICD-10-CM

## 2019-05-29 DIAGNOSIS — I10 Essential (primary) hypertension: Secondary | ICD-10-CM

## 2019-05-29 HISTORY — PX: INSERTION OF ILIAC STENT: SHX6256

## 2019-05-29 HISTORY — PX: ENDARTERECTOMY FEMORAL: SHX5804

## 2019-05-29 LAB — APTT: aPTT: 78 seconds — ABNORMAL HIGH (ref 24–36)

## 2019-05-29 LAB — GLUCOSE, CAPILLARY
Glucose-Capillary: 115 mg/dL — ABNORMAL HIGH (ref 70–99)
Glucose-Capillary: 109 mg/dL — ABNORMAL HIGH (ref 70–99)
Glucose-Capillary: 150 mg/dL — ABNORMAL HIGH (ref 70–99)
Glucose-Capillary: 146 mg/dL — ABNORMAL HIGH (ref 70–99)
Glucose-Capillary: 174 mg/dL — ABNORMAL HIGH (ref 70–99)

## 2019-05-29 LAB — CBC
HCT: 38.9 % — ABNORMAL LOW (ref 39.0–52.0)
Hemoglobin: 12.9 g/dL — ABNORMAL LOW (ref 13.0–17.0)
MCH: 30.4 pg (ref 26.0–34.0)
MCHC: 33.2 g/dL (ref 30.0–36.0)
MCV: 91.5 fL (ref 80.0–100.0)
Platelets: 166 10*3/uL (ref 150–400)
RBC: 4.25 MIL/uL (ref 4.22–5.81)
RDW: 13.4 % (ref 11.5–15.5)
WBC: 6 10*3/uL (ref 4.0–10.5)
nRBC: 0 % (ref 0.0–0.2)

## 2019-05-29 LAB — BASIC METABOLIC PANEL
Anion gap: 8 (ref 5–15)
BUN: 25 mg/dL — ABNORMAL HIGH (ref 8–23)
CO2: 30 mmol/L (ref 22–32)
Calcium: 9.5 mg/dL (ref 8.9–10.3)
Chloride: 102 mmol/L (ref 98–111)
Creatinine, Ser: 1.73 mg/dL — ABNORMAL HIGH (ref 0.61–1.24)
GFR calc Af Amer: 40 mL/min — ABNORMAL LOW (ref >60)
GFR calc non Af Amer: 35 mL/min — ABNORMAL LOW (ref >60)
Glucose, Bld: 122 mg/dL — ABNORMAL HIGH (ref 70–99)
Potassium: 3.8 mmol/L (ref 3.5–5.1)
Sodium: 140 mmol/L (ref 135–145)

## 2019-05-29 LAB — PROTIME-INR
INR: 1.1 (ref 0.8–1.2)
Prothrombin Time: 13.7 seconds (ref 11.4–15.2)

## 2019-05-29 LAB — MAGNESIUM: Magnesium: 2.2 mg/dL (ref 1.7–2.4)

## 2019-05-29 SURGERY — ENDARTERECTOMY, FEMORAL
Anesthesia: General | Site: Groin | Laterality: Right

## 2019-05-29 MED ORDER — ONDANSETRON HCL 4 MG/2ML IJ SOLN
4.0000 mg | Freq: Once | INTRAMUSCULAR | Status: AC | PRN
  Administered 2019-05-29: 4 mg via INTRAVENOUS

## 2019-05-29 MED ORDER — BUPIVACAINE LIPOSOME 1.3 % IJ SUSP
INTRAMUSCULAR | Status: AC
  Filled 2019-05-29: qty 20

## 2019-05-29 MED ORDER — HEPARIN SODIUM (PORCINE) 1000 UNIT/ML IJ SOLN
INTRAMUSCULAR | Status: AC
  Filled 2019-05-29: qty 1

## 2019-05-29 MED ORDER — HEPARIN SODIUM (PORCINE) 1000 UNIT/ML IJ SOLN
INTRAMUSCULAR | Status: DC | PRN
  Administered 2019-05-29 (×2): 2000 [IU] via INTRAVENOUS
  Administered 2019-05-29: 5000 [IU] via INTRAVENOUS

## 2019-05-29 MED ORDER — PHENYLEPHRINE HCL (PRESSORS) 10 MG/ML IV SOLN
INTRAVENOUS | Status: DC | PRN
  Administered 2019-05-29 (×3): 100 ug via INTRAVENOUS
  Administered 2019-05-29: 200 ug via INTRAVENOUS

## 2019-05-29 MED ORDER — ONDANSETRON HCL 4 MG/2ML IJ SOLN
INTRAMUSCULAR | Status: DC | PRN
  Administered 2019-05-29: 4 mg via INTRAVENOUS

## 2019-05-29 MED ORDER — FENTANYL CITRATE (PF) 100 MCG/2ML IJ SOLN
INTRAMUSCULAR | Status: DC | PRN
  Administered 2019-05-29: 50 ug via INTRAVENOUS
  Administered 2019-05-29 (×2): 25 ug via INTRAVENOUS

## 2019-05-29 MED ORDER — BUPIVACAINE LIPOSOME 1.3 % IJ SUSP
INTRAMUSCULAR | Status: DC | PRN
  Administered 2019-05-29: 20 mL

## 2019-05-29 MED ORDER — SEVOFLURANE IN SOLN
RESPIRATORY_TRACT | Status: AC
  Filled 2019-05-29: qty 250

## 2019-05-29 MED ORDER — PROPOFOL 10 MG/ML IV BOLUS
INTRAVENOUS | Status: DC | PRN
  Administered 2019-05-29: 70 mg via INTRAVENOUS

## 2019-05-29 MED ORDER — PHENYLEPHRINE HCL-NACL 20-0.9 MG/250ML-% IV SOLN
INTRAVENOUS | Status: DC | PRN
  Administered 2019-05-29: 50 ug/min via INTRAVENOUS

## 2019-05-29 MED ORDER — CHLORHEXIDINE GLUCONATE CLOTH 2 % EX PADS
6.0000 | MEDICATED_PAD | Freq: Every day | CUTANEOUS | Status: DC
  Administered 2019-05-30: 06:00:00 6 via TOPICAL

## 2019-05-29 MED ORDER — SODIUM CHLORIDE 0.9 % IV SOLN
INTRAVENOUS | Status: DC | PRN
  Administered 2019-05-29: 100 mL via INTRAMUSCULAR

## 2019-05-29 MED ORDER — CEFAZOLIN SODIUM-DEXTROSE 2-4 GM/100ML-% IV SOLN
INTRAVENOUS | Status: AC
  Filled 2019-05-29: qty 100

## 2019-05-29 MED ORDER — "VISTASEAL 4 ML SINGLE DOSE KIT "
PACK | CUTANEOUS | Status: DC | PRN
  Administered 2019-05-29: 4 mL via TOPICAL

## 2019-05-29 MED ORDER — SUGAMMADEX SODIUM 200 MG/2ML IV SOLN
INTRAVENOUS | Status: DC | PRN
  Administered 2019-05-29: 150 mg via INTRAVENOUS

## 2019-05-29 MED ORDER — HEMOSTATIC AGENTS (NO CHARGE) OPTIME
TOPICAL | Status: DC | PRN
  Administered 2019-05-29: 1 via TOPICAL

## 2019-05-29 MED ORDER — LIDOCAINE HCL (CARDIAC) PF 100 MG/5ML IV SOSY
PREFILLED_SYRINGE | INTRAVENOUS | Status: DC | PRN
  Administered 2019-05-29: 100 mg via INTRAVENOUS

## 2019-05-29 MED ORDER — HEPARIN SODIUM (PORCINE) 5000 UNIT/ML IJ SOLN
INTRAMUSCULAR | Status: AC
  Filled 2019-05-29: qty 1

## 2019-05-29 MED ORDER — ONDANSETRON HCL 4 MG/2ML IJ SOLN
INTRAMUSCULAR | Status: AC
  Filled 2019-05-29: qty 2

## 2019-05-29 MED ORDER — CEFAZOLIN SODIUM-DEXTROSE 2-4 GM/100ML-% IV SOLN
2.0000 g | INTRAVENOUS | Status: AC
  Administered 2019-05-29: 2 g via INTRAVENOUS
  Filled 2019-05-29: qty 100

## 2019-05-29 MED ORDER — ROCURONIUM BROMIDE 100 MG/10ML IV SOLN
INTRAVENOUS | Status: DC | PRN
  Administered 2019-05-29: 20 mg via INTRAVENOUS
  Administered 2019-05-29: 10 mg via INTRAVENOUS
  Administered 2019-05-29: 40 mg via INTRAVENOUS
  Administered 2019-05-29: 30 mg via INTRAVENOUS

## 2019-05-29 MED ORDER — PHENYLEPHRINE HCL (PRESSORS) 10 MG/ML IV SOLN
INTRAVENOUS | Status: AC
  Filled 2019-05-29: qty 1

## 2019-05-29 MED ORDER — FENTANYL CITRATE (PF) 100 MCG/2ML IJ SOLN
INTRAMUSCULAR | Status: AC
  Filled 2019-05-29: qty 2

## 2019-05-29 MED ORDER — FENTANYL CITRATE (PF) 100 MCG/2ML IJ SOLN: 25.0000 ug | INTRAMUSCULAR | Status: DC | PRN

## 2019-05-29 MED ORDER — PROPOFOL 10 MG/ML IV BOLUS
INTRAVENOUS | Status: AC
  Filled 2019-05-29: qty 20

## 2019-05-29 MED ORDER — PROPOFOL 500 MG/50ML IV EMUL
INTRAVENOUS | Status: AC
  Filled 2019-05-29: qty 50

## 2019-05-29 MED ORDER — DEXAMETHASONE SODIUM PHOSPHATE 10 MG/ML IJ SOLN
INTRAMUSCULAR | Status: DC | PRN
  Administered 2019-05-29: 5 mg via INTRAVENOUS

## 2019-05-29 SURGICAL SUPPLY — 94 items
ADH SKN CLS APL DERMABOND .7 (GAUZE/BANDAGES/DRESSINGS) ×1
APL PRP STRL LF DISP 70% ISPRP (MISCELLANEOUS) ×1
APPLIER CLIP 11 MED OPEN (CLIP)
APPLIER CLIP 9.375 SM OPEN (CLIP)
APR CLP MED 11 20 MLT OPN (CLIP)
APR CLP SM 9.3 20 MLT OPN (CLIP)
BAG COUNTER SPONGE EZ (MISCELLANEOUS) ×1 IMPLANT
BAG DECANTER FOR FLEXI CONT (MISCELLANEOUS) ×3 IMPLANT
BAG ISL LRG 20X20 DRWSTRG (DRAPES)
BAG ISOLATATION DRAPE 20X20 ST (DRAPES) IMPLANT
BAG SPNG 4X4 CLR HAZ (MISCELLANEOUS)
BALLN ATG 14X4X80 (BALLOONS) ×3
BALLN LUTONIX 018 4X150X130 (BALLOONS) ×3
BALLN LUTONIX 018 5X300X130 (BALLOONS) ×3
BALLN ULTRVRSE 3X100X150 (BALLOONS) ×3
BALLOON ATG 14X4X80 (BALLOONS) IMPLANT
BALLOON LUTONIX 018 4X150X130 (BALLOONS) IMPLANT
BALLOON LUTONIX 018 5X300X130 (BALLOONS) IMPLANT
BALLOON ULTRVRSE 3X100X150 (BALLOONS) IMPLANT
BLADE SURG 15 STRL LF DISP TIS (BLADE) ×1 IMPLANT
BLADE SURG 15 STRL SS (BLADE) ×3
BLADE SURG SZ11 CARB STEEL (BLADE) ×5 IMPLANT
BOOT SUTURE AID YELLOW STND (SUTURE) ×3 IMPLANT
BRUSH SCRUB EZ  4% CHG (MISCELLANEOUS) ×3
BRUSH SCRUB EZ 4% CHG (MISCELLANEOUS) ×1 IMPLANT
CANISTER SUCT 1200ML W/VALVE (MISCELLANEOUS) ×3 IMPLANT
CATH BEACON 5 .035 40 KMP TP (CATHETERS) IMPLANT
CATH BEACON 5 .035 65 KMP TIP (CATHETERS) ×2 IMPLANT
CATH BEACON 5 .038 40 KMP TP (CATHETERS) ×3
CATH FOLEY 2WAY 12FR 5CC LATEX (CATHETERS) ×2 IMPLANT
CHLORAPREP W/TINT 26 (MISCELLANEOUS) ×3 IMPLANT
CLIP APPLIE 11 MED OPEN (CLIP) IMPLANT
CLIP APPLIE 9.375 SM OPEN (CLIP) IMPLANT
COUNTER SPONGE BAG EZ (MISCELLANEOUS)
COVER WAND RF STERILE (DRAPES) ×3 IMPLANT
DERMABOND ADVANCED (GAUZE/BANDAGES/DRESSINGS) ×2
DERMABOND ADVANCED .7 DNX12 (GAUZE/BANDAGES/DRESSINGS) ×1 IMPLANT
DEVICE PRESTO INFLATION (MISCELLANEOUS) ×2 IMPLANT
DRAPE INCISE IOBAN 66X45 STRL (DRAPES) ×3 IMPLANT
DRAPE ISOLATE BAG 20X20 STRL (DRAPES)
ELECT CAUTERY BLADE 6.4 (BLADE) ×3 IMPLANT
ELECT REM PT RETURN 9FT ADLT (ELECTROSURGICAL) ×3
ELECTRODE REM PT RTRN 9FT ADLT (ELECTROSURGICAL) ×1 IMPLANT
GLIDEWIRE ADV .035X180CM (WIRE) ×2 IMPLANT
GLOVE BIO SURGEON STRL SZ7 (GLOVE) ×16 IMPLANT
GLOVE INDICATOR 7.5 STRL GRN (GLOVE) ×5 IMPLANT
GOWN STRL REUS W/ TWL LRG LVL3 (GOWN DISPOSABLE) ×1 IMPLANT
GOWN STRL REUS W/ TWL XL LVL3 (GOWN DISPOSABLE) ×2 IMPLANT
GOWN STRL REUS W/TWL LRG LVL3 (GOWN DISPOSABLE) ×6
GOWN STRL REUS W/TWL XL LVL3 (GOWN DISPOSABLE) ×12
GUIDEWIRE SUPER STIFF .035X180 (WIRE) ×2 IMPLANT
HEMOSTAT SURGICEL 2X3 (HEMOSTASIS) ×3 IMPLANT
IV NS 500ML (IV SOLUTION) ×3
IV NS 500ML BAXH (IV SOLUTION) ×1 IMPLANT
KIT TURNOVER KIT A (KITS) ×3 IMPLANT
LABEL OR SOLS (LABEL) ×3 IMPLANT
LOOP RED MAXI  1X406MM (MISCELLANEOUS) ×6
LOOP VESSEL MAXI  1X406 RED (MISCELLANEOUS) ×3
LOOP VESSEL MAXI 1X406 RED (MISCELLANEOUS) ×2 IMPLANT
LOOP VESSEL MINI 0.8X406 BLUE (MISCELLANEOUS) ×2 IMPLANT
LOOPS BLUE MINI 0.8X406MM (MISCELLANEOUS) ×6
NDL SAFETY ECLIPSE 18X1.5 (NEEDLE) ×1 IMPLANT
NEEDLE HYPO 18GX1.5 SHARP (NEEDLE) ×3
NS IRRIG 500ML POUR BTL (IV SOLUTION) ×3 IMPLANT
PACK ANGIOGRAPHY (CUSTOM PROCEDURE TRAY) ×2 IMPLANT
PACK BASIN MAJOR (MISCELLANEOUS) ×3 IMPLANT
PACK UNIVERSAL (MISCELLANEOUS) ×3 IMPLANT
PATCH CAROTID ECM VASC 1X10 (Prosthesis & Implant Heart) ×2 IMPLANT
SHEATH BRITE TIP 8FRX11 (SHEATH) ×2 IMPLANT
STENT LIFESTREAM 12X58X80 (Permanent Stent) ×6 IMPLANT
STENT VIABAHN 10X10X120 (Permanent Stent) ×2 IMPLANT
STENT VIABAHN 6X250X120 (Permanent Stent) ×2 IMPLANT
STENT VIABAHN 6X7.5X120 (Permanent Stent) ×2 IMPLANT
SUT MNCRL 4-0 (SUTURE) ×3
SUT MNCRL 4-0 27XMFL (SUTURE) ×1
SUT PROLENE 5 0 RB 1 DA (SUTURE) ×10 IMPLANT
SUT PROLENE 6 0 BV (SUTURE) ×24 IMPLANT
SUT PROLENE 7 0 BV 1 (SUTURE) ×2 IMPLANT
SUT SILK 2 0 (SUTURE) ×6
SUT SILK 2-0 18XBRD TIE 12 (SUTURE) ×1 IMPLANT
SUT SILK 3 0 (SUTURE) ×3
SUT SILK 3-0 18XBRD TIE 12 (SUTURE) ×1 IMPLANT
SUT SILK 4 0 (SUTURE) ×3
SUT SILK 4-0 18XBRD TIE 12 (SUTURE) ×1 IMPLANT
SUT VIC AB 2-0 CT1 27 (SUTURE) ×3
SUT VIC AB 2-0 CT1 TAPERPNT 27 (SUTURE) ×2 IMPLANT
SUT VIC AB 3-0 SH 27 (SUTURE) ×3
SUT VIC AB 3-0 SH 27X BRD (SUTURE) ×1 IMPLANT
SUT VICRYL+ 3-0 36IN CT-1 (SUTURE) ×6 IMPLANT
SUTURE MNCRL 4-0 27XMF (SUTURE) ×1 IMPLANT
SYR 20ML LL LF (SYRINGE) ×3 IMPLANT
SYR 5ML LL (SYRINGE) ×3 IMPLANT
TRAY FOLEY MTR SLVR 16FR STAT (SET/KITS/TRAYS/PACK) ×3 IMPLANT
WIRE G V18X300CM (WIRE) ×2 IMPLANT

## 2019-05-29 NOTE — Anesthesia Preprocedure Evaluation (Addendum)
Anesthesia Evaluation  Patient identified by MRN, date of birth, ID band Patient awake    Reviewed: Allergy & Precautions, H&P , NPO status , Patient's Chart, lab work & pertinent test results, reviewed documented beta blocker date and time   Airway Mallampati: II  TM Distance: >3 FB     Dental  (+) Edentulous Upper, Edentulous Lower   Pulmonary former smoker,    Pulmonary exam normal        Cardiovascular hypertension, + CAD, + Past MI and + Peripheral Vascular Disease  Normal cardiovascular exam+ dysrhythmias + Valvular Problems/Murmurs MR   2017 Study Conclusions  - Left ventricle: The cavity size was normal. There was mild   concentric hypertrophy. Systolic function was normal. The   estimated ejection fraction was in the range of 50% to 55%.   Possible hypokinesis of the apicalanterior and apical myocardium.   Left ventricular diastolic function parameters were normal. - Mitral valve: There was mild regurgitation. - Left atrium: The atrium was moderately dilated. - Pulmonary arteries: Systolic pressure was within the normal   range.   Neuro/Psych negative neurological ROS  negative psych ROS   GI/Hepatic negative GI ROS, Neg liver ROS, neg GERD  ,  Endo/Other  diabetes  Renal/GU Renal disease  negative genitourinary   Musculoskeletal  (+) Arthritis ,   Abdominal   Peds  Hematology   Anesthesia Other Findings Past Medical History: No date: AAA (abdominal aortic aneurysm) (HCC) No date: Arthritis     Comment:  in fingers and hands No date: Cancer ([place])     Comment:  skin cancer removed several No date: Diabetes mellitus without complication ([place]) No date: Dysrhythmia     Comment:  atrial fibrillation.  on eliquis No date: History of kidney stones No date: Hypertension No date: Mitral insufficiency     Comment:  per dr. Clayborn Bigness 1990: Myocardial infarct [facility]) 1990: Transfusion history     Comment:   received several units of blood after CABG Past Surgical History: 2005, [facility]: ABDOMINAL AORTIC ANEURYSM REPAIR 1990: Cardiac bypass     Comment:  [facility]. triple cabg 1990: CARDIAC CATHETERIZATION     Comment:  stents placed but ultimately clotted and shut down. 1990: CARDIAC SURGERY     Comment:  duplicate entry [id number]: CHOLECYSTECTOMY; N/A     Comment:  Dr Bary Castilla 1990: CORONARY ARTERY BYPASS GRAFT     Comment:  x 3. performed after coronary stents failed No date: EYE SURGERY; Bilateral     Comment:  cataract extractions with iol BMI    Body Mass Index:  23.34 kg/m   EF 40-45%  Reproductive/Obstetrics                            Anesthesia Physical  Anesthesia Plan  ASA: III  Anesthesia Plan: General   Post-op Pain Management:    Induction: Intravenous  PONV Risk Score and Plan: 2 and Ondansetron, Treatment may vary due to age or medical condition, Dexamethasone and Metaclopromide  Airway Management Planned: Oral ETT  Additional Equipment: Arterial line  Intra-op Plan:   Post-operative Plan: Extubation in OR  Informed Consent: I have reviewed the patients History and Physical, chart, labs and discussed the procedure including the risks, benefits and alternatives for the proposed anesthesia with the patient or authorized representative who has indicated his/her understanding and acceptance.     Dental Advisory Given  Plan Discussed with: CRNA and Surgeon  Anesthesia Plan Comments:  Anesthesia Quick Evaluation  

## 2019-05-29 NOTE — Anesthesia Procedure Notes (Signed)
Procedure Name: Intubation Date/Time: 05/29/2019 11:30 AM Performed by: Justus Memory, CRNA Pre-anesthesia Checklist: Patient identified, Patient being monitored, Timeout performed, Emergency Drugs available and Suction available Patient Re-evaluated:Patient Re-evaluated prior to induction Oxygen Delivery Method: Circle system utilized Preoxygenation: Pre-oxygenation with 100% oxygen Induction Type: IV induction Ventilation: Mask ventilation with difficulty and Oral airway inserted - appropriate to patient size Laryngoscope Size: 3 and McGraph Grade View: Grade I Tube type: Oral Tube size: 7.0 mm Number of attempts: 1 Airway Equipment and Method: Stylet and Video-laryngoscopy Placement Confirmation: ETT inserted through vocal cords under direct vision,  positive ETCO2 and breath sounds checked- equal and bilateral Secured at: 21 cm Tube secured with: Tape Dental Injury: Teeth and Oropharynx as per pre-operative assessment

## 2019-05-29 NOTE — Anesthesia Procedure Notes (Signed)
Arterial Line Insertion Start/End5/05/2019 11:40 AM, 05/29/2019 11:50 AM Performed by: Alvin Critchley, MD, anesthesiologist  Patient location: OR. Preanesthetic checklist: patient identified, IV checked, risks and benefits discussed, surgical consent, monitors and equipment checked, pre-op evaluation, timeout performed and anesthesia consent Right, radial was placed Catheter size: 20 G Hand hygiene performed  and Seldinger technique used  Attempts: 1 Procedure performed without using ultrasound guided technique. Following insertion, dressing applied. Post procedure assessment: normal  Patient tolerated the procedure well with no immediate complications.

## 2019-05-29 NOTE — Progress Notes (Addendum)
Pt transferred to ICU. VSS. Pedal pulses detected by doppler. L foot cold. R foot warm. Cap refill less than 3 seconds. Decreased sensation to R and L foot, per pt he does feel pressure. Surgical site intact but has swelling and is firm. Per PACU Dr. Lucky Cowboy is aware and continue to monitor for changes. Site is marked.

## 2019-05-29 NOTE — Op Note (Signed)
OPERATIVE NOTE   PROCEDURE: 1. Right common femoral, profunda femoris, and superficial femoral artery endarterectomies and patch angioplasty 2.   Catheter placement of the aorta from right femoral approach and into the right peroneal artery from the right femoral approach 3.   Aortogram and selective right lower extremity angiogram 4.   Lifestream stent placement to the distal aorta and most proximal right common iliac artery with a 12 mm diameter by 58 mm length stent 5.   Lifestream stent placement to the right common iliac artery with a 12 mm diameter by 58 mm length lifestream stent 6.   Additional stent placement to the right external iliac artery with a 10 mm diameter by 10 cm length Viabahn stent 7.   Angioplasty of right peroneal artery and tibioperoneal trunk with 3 mm diameter angioplasty balloon 8.   Angioplasty of the right popliteal artery with 4 mm diameter Lutonix drug-coated angioplasty balloon 9.   Angioplasty of the entire right SFA with a 5 mm diameter by 30 cm length Lutonix drug-coated angioplasty balloon 10.  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  Previous aortic tube graft repair for an aneurysm with residual juxtarenal aneurysm present. 3.  Distal aortic stenosis at the bottom of the previous to the tube graft repair as well as severe right iliac artery occlusive disease.  POST-OPERATIVE DIAGNOSIS: Same  SURGEON: Leotis Pain, MD  CO-surgeon: Hortencia Pilar, MD  ANESTHESIA: general  ESTIMATED BLOOD LOSS: 100 cc  FINDING(S): 1. significant plaque in right common femoral, profunda femoris, and superficial femoral arteries  SPECIMEN(S): Right common femoral, profunda femoris,  and superficial femoral artery plaque.  INDICATIONS:  Patient presents with rest pain of the right foot and an ABI of 0.15.  He had a previous tube graft repair for an aneurysm 14 years ago and still has a residual juxtarenal abdominal aortic aneurysm in the 4-1/2 cm range.  His left iliac is occluded, but he denies any left leg symptoms at all at this point.  Right femoral endarterectomy is planned to try to improve perfusion. This is to be done in conjunction with significant endovascular work to treat his distal aortic disease, right iliac disease, and infrainguinal disease on the right.  The risks and benefits as well as alternative therapies including intervention were reviewed in detail all questions were answered the patient agrees to proceed with surgery.  DESCRIPTION: After obtaining full informed written consent, the patient was brought back to the operating room and placed supine upon the operating table. The patient received IV antibiotics prior to induction. After obtaining adequate anesthesia, the patient was prepped and draped in the standard fashion appropriate time out is called.   Vertical incision was created overlying the right femoral arteries. The common femoral artery proximally, and superficial femoral artery, and primary profunda femoris artery branches were encircled with vessel loops and prepared for control. The right femoral arteries were found to have significant plaque from the common femoral artery into the profunda and superficial femoral arteries.   5000 units of heparin was given and allowed circulate for 5 minutes.  Additional heparin was given as needed during the procedure  Attention is then turned to the right femoral artery. An arteriotomy is made  with 11 blade and extended with Potts scissors in the common femoral artery and carried down onto the first 2-3 cm of the superficial femoral artery. An endarterectomy was then performed. The [facility] was  used to create a plane.  The plaque was extremely calcific and dense.  The proximal endpoint was created with gentle traction. This was in the proximal common femoral artery. An eversion endarterectomy was then performed for the first 2-3 cm of the profunda femoris artery with the [facility] and gentle traction. Good backbleeding was then seen. The distal endpoint of the superficial femoral endarterectomy was created with gentle traction and the distal endpoint was relatively clean. The Cormatrix patcth is then selected and prepared for a patch angioplasty.  It is cut and beveled and started at the proximal endpoint with a 6-0 Prolene suture.  Approximately one half of the suture line is run medially and laterally and the distal end point was cut and bevelled to match the arteriotomy.  A second 6-0 Prolene was started at the distal end point and run to the mid portion to complete the arteriotomy.  A gap was left on the medial side for sheath placement for the endovascular portions of the procedure.  An 8 French sheath was then placed in a retrograde direction and the aortoiliac disease was addressed.  I crossed the lesions without difficulty with a Kumpe catheter and the advantage wire and with the catheter in the aorta imaging is performed.  The napkin ringlike lesion in the distal aorta and the most proximal portion of the common iliac artery in the 60 to 70% range just below the tube graft was addressed first.  A 12 mm diameter by 58 mm length lifestream stent was taken up into the aortic tube graft about 3 cm and across this lesion just into the common iliac artery.  This was then postdilated with a 14 mm balloon proximally to seat in the distal portion of the tube graft.  There was a very calcific rocklike lesion in the mid external iliac artery of greater than 80% and then diffuse calcific moderate disease in the 60% range in the distal common iliac artery and proximal external iliac artery.  The  external iliac artery was then addressed with a 10 mm diameter by 10 cm length Viabahn stent that was taken just up to the distal common iliac artery.  An additional 12 mm diameter by 58 mm length lifestream stent was then bridged between the 2 previously placed stents with about a centimeter of overlap on each side.  That 12 mm balloon was then used to post dilate the Viabahn stents.  Completion imaging following this showed less than 10% residual stenosis with brisk flow through all 3 stents.  The sheath was then placed in an antegrade direction to address the SFA, popliteal, and tibial disease.  Selective imaging was performed which was better than the imaging from his angiogram earlier in the week.  The SFA was diffusely diseased with multiple areas of high-grade stenosis and a couple areas of short segment occlusion.  The above-knee popliteal artery normalized down to the popliteal artery just below the knee where there was about a 70 to 80% stenosis.  The tibioperoneal trunk and then the peroneal artery was the best runoff distally although the peroneal artery had a 60 to 70% stenosis in the proximal segment as well.  The posterior tibial artery appeared to have some degree of stenosis at the proximal segment and was a  smaller vessel that was present as well.  Using a Kumpe catheter and advantage wire was able to easily cross the lesion and confirm intraluminal flow in the popliteal artery then exchanging for the 0.018 wire parking this in the distal peroneal artery.  The peroneal lesion was treated with a 3 mm diameter by 10 cm length angioplasty balloon inflated to 8 atm for 1 minute.  The mid popliteal lesion was treated with a 4 mm diameter by 15 cm length Lutonix drug-coated angioplasty balloon inflated to 10 atm for 1 minute.  The SFA was then treated with a 5 mm diameter by 30 cm length Lutonix drug-coated angioplasty balloon inflated to 8 atm for 1 minute.  Completion imaging showed only about a 20%  residual stenosis in the popliteal artery and less than 10% residual stenosis in the peroneal artery, but the proximal, mid, and distal SFA all had greater than 50% residual stenosis in some segments.  I elected to treat this area with stent placement.  A 6 mm diameter by 25 cm length Viabahn stent was deployed from near Hunter's canal up to the proximal SFA.  A 6 mm diameter by 7.5 cm length Viabahn stent was then taken up to the most proximal SFA just below our distal endpoint of the endarterectomy.  This was post dilated with a 5 mm balloon with excellent angiographic completion result and less than 20% residual stenosis.  The sheath was then removed and we proceeded with closure of the vessel.  The vessel was flushed prior to release of control and completion of the anastomosis.  At this point, flow was established first to the profunda femoris artery and then to the superficial femoral artery. Easily palpable pulses are noted well beyond the anastomosis and both arteries.  Surgicel and Evicel topical hemostatic agents were placed in the femoral incision and hemostasis was complete. The femoral incision was then closed in a layered fashion with 2 layers of 2-0 Vicryl, 2 layers of 3-0 Vicryl, and 4-0 Monocryl for the skin closure. Dermabond and sterile dressing were then placed over the incision.  The patient was then awakened from anesthesia and taken to the recovery room in stable condition having tolerated the procedure well.  COMPLICATIONS: None  CONDITION: Stable     Leotis Pain 05/29/2019 3:18 PM   This note was created with Dragon Medical transcription system. Any errors in dictation are purely unintentional.

## 2019-05-29 NOTE — Transfer of Care (Signed)
Immediate Anesthesia Transfer of Care Note  Patient: [name]  Procedure(s) Performed: ENDARTERECTOMY FEMORAL (Right Groin) INSERTION OF ILIAC STENT ( AND SFA STENT) (Right Groin)  Patient Location: PACU  Anesthesia Type:General  Level of Consciousness: drowsy and patient cooperative  Airway & Oxygen Therapy: Patient Spontanous Breathing and Patient connected to face mask oxygen  Post-op Assessment: Report given to RN and Post -op Vital signs reviewed and stable  Post vital signs: Reviewed and stable  Last Vitals:  Vitals Value Taken Time  BP 137/74 05/29/19 1552  Temp 36.4 C 05/29/19 1552  Pulse 89 05/29/19 1602  Resp 19 05/29/19 1602  SpO2 99 % 05/29/19 1602  Vitals shown include unvalidated device data.  Last Pain:  Vitals:   05/29/19 1552  TempSrc:   PainSc: 0-No pain         Complications: No apparent anesthesia complications

## 2019-05-29 NOTE — H&P (Signed)
[facility] History & Physical Update  The patient was interviewed and re-examined.  The patient's previous History and Physical has been reviewed and is unchanged.  There is no change in the plan of care. We plan to proceed with the scheduled procedure.  Leotis Pain, MD  05/29/2019, 10:17 AM

## 2019-05-29 NOTE — Progress Notes (Signed)
Dr. Lucky Cowboy called and updated on cool LLE that can be heard with doppler. Per MD this is not new, continue to monitor. RN also asked about BP medications as BP is trending up. BP medications are due at 2200. Report given to [facility].

## 2019-05-29 NOTE — Op Note (Signed)
OPERATIVE NOTE   PROCEDURE: 1. Right common femoral, superficial femoral and profunda femoris endarterectomy with Cormatrix patch angioplasty. 2. Open angioplasty and stent placement of the distal aorta. 3. Open angioplasty and stent placement of the right common iliac artery. 4. Open angioplasty and stent placement of the right external iliac artery. 5. Open angioplasty and stent placement of the right superficial femoral artery and popliteal artery. 6. Open angioplasty of the peroneal and tibioperoneal trunk  PRE-OPERATIVE DIAGNOSIS: Atherosclerotic occlusive disease right lower extremity with lifestyle limiting claudication and significant rest pain symptoms; hypertension  POST-OPERATIVE DIAGNOSIS: Same  CO-SURGEON: Katha Cabal, MD and Algernon Huxley, M.D.  ASSISTANT(S): None  ANESTHESIA: general  ESTIMATED BLOOD LOSS: 100 cc  FINDING(S): 1. Profound calcific plaque noted of the right common femoral extending past the initial bifurcation of the profunda femoris arteries as well as down the extensive length of the SFA  SPECIMEN(S):  Calcific plaque from the common femoral, superficial femoral and the profunda femoris artery  INDICATIONS:   Red Christians 86 y.o. y.o.male who presents with complaints of lifestyle limiting claudication and pain continuously in the right short lower extremity. The patient has documented severe atherosclerotic occlusive disease and has undergone minimally invasive treatments in the past. However, at this point his primary area of stricture stenosis resides in the common femoral and origins of the superficial femoral and profunda femoris extending into these arteries and therefore this is not amenable to intervention. The patient is therefore undergoing open endarterectomy. The risks and benefits of surgery have been reviewed with the patient, all questions have answered; alternative therapies have been  reviewed as well and the patient has agreed to proceed with surgical open repair.  DESCRIPTION: After obtaining full informed written consent, the patient was brought back to the operating room and placed supine upon the operating table.  The patient received IV antibiotics prior to induction.  After obtaining adequate anesthesia, the patient was prepped and draped in the standard fashion for right femoral exposure.  Co-surgeons are required because this is a complicated procedure with work being performed simultaneously from both the patient's right left sides.  This also expedites the procedure making a shorter operative time reducing complications and improving patient safety.  Attention was turned to the right groin with Dr. Lucky Cowboy working on the patient's right and myself working on the left of the patient.  Vertical  Incision was made over the right common femoral artery and dissection carried down to the common femoral artery with electrocautery.  I dissected out the common femoral artery from the distal external iliac artery (identified by the superficial circumflex vessels) down to the femoral bifurcation.  On initial inspection, the common femoral artery was: densely calcified and there was no palpable pulse noted.    Subsequently the dissection was continued to include all circumflex branches, several large collateral branches and the profunda femoral artery and superficial femoral artery. The superficial femoral artery was dissected circumferentially for a distance of approximately 3-4 cm and the profunda femoris was dissected circumferentially out to the fourth order branches, which were individually controlled with vessel loops.  After control of all branches was obtained with vessel loops a softer area in the distal external iliac artery amendable to clamping was identified.    The patient was given 5000 units of Heparin intravenously, which was a therapeutic bolus.   After waiting 3 minutes, the  distal external iliac artery was clamped and all of the vessel loops were placed  under tension.  Arteriotomy was made in the common femoral artery with a 11-blade and extended it with a Potts scissor proximally and distally extending the distal end down the SFA for approximately 3 cm.   Endarterectomy was then performed under direct visualization using a freer elevator and a right angle from the mid common femoral extending up both proximally and distally. Proximally the endarterectomy was brought up to the level of the clamp where a clean edge was obtained. Distally the endarterectomy was carried down to a soft spot in the SFA where a feathered edge would was obtained.    The profunda femoris was treated with an eversion technique extending endarterectomy approximately 2-3 cm distally again obtaining a featheredge of the distal profunda at the level of the second major bifurcation.   At this point, a corematrix patch was fashioned for the geometry of the arteriotomy.  The patch was sewn to the artery with 2 running stitches of 6-0 Prolene, running from each end.  Prior to completing the patch angioplasty, the profunda femoral artery was flushed as was the superficial femoral artery. The system was then forward flushed. The endarterectomy site was then irrigated copiously with heparinized saline. The patch angioplasty was completed in the usual fashion on the lateral side on the medial side a small gap was left in the suture line to allow for placement of a sheath.  At this point an additional 2000 units of heparin was given and later in the case an additional 2000 was given for a total of 9000.  8 French sheath was then inserted in a retrograde fashion through the gap in the patch extending into the external iliac artery.  Using a combination of a Kumpe catheter and an advantage wire the extensive iliac disease was crossed and the catheter was advanced into the previously placed aortic tube graft.   Hand-injection of contrast was then performed to demonstrate the aortoiliac disease.  Findings demonstrate a greater than 80% stenosis in the distal aorta at the site of the anastomosis.  There is greater than 80% stenosis diffusely throughout the right common iliac artery.  There is a very large eccentric lesion noted in the proximal half of the external iliac artery on the right that causes a greater than 70% stenosis.  In order to treat these lesions we first addressed the aorta placing a 12 x 58 lifestream stent deploying it so that it is approximately 3 to 4 mm into the aorta across this anastomotic stricture.  Next we treated the external iliac artery with a 10 mm x 100 mm Viabahn across the eccentric lesion in the right external iliac artery.  Lastly, we bridged these 2 stents with a second 12 mm x 58 mm lifestream stent and use the balloon from the stent to post dilate the Viabahn.  Once all 3 stents were in position and fully expanded follow-up angiography demonstrated wide patency of the aorta down to the common femoral with less than 10% residual stenosis.  The iliac system was irrigated copiously with heparinized saline the sheath was removed.  The sheath was then inserted under direct visualization in an antegrade fashion into the right SFA.  Kumpe catheter and wire were negotiated down into the mid popliteal where hand-injection contrast demonstrated the tibial anatomy and greater detail.  Imaging from last week suggested the tibials were diffusely diseased but without a hemodynamically significant stenosis.  This was not the case as the distal tibioperoneal trunk and the proximal 5-10 cm of  the peroneal demonstrated greater than 80% stenosis.  The anterior tibial appears to occlude just past its origin.  The posterior tibial was patent.  We began treating the distal outflow with a 3 mm Ultraverse balloon positioned with its distal tip in the mid peroneal inflation was to 10 atm for 1 minute.  We  then used a 4 mm Lutonix drug-eluting balloon in the popliteal.  Lastly the SFA was treated with a 5 mm Lutonix drug-eluting balloon.  Follow-up imaging demonstrated greater than 50% residual stenosis and the SFA was treated with 2 Viabahn stents in series a 6 mm x 25 cm followed by a 6 mm x 7.5 cm Viabahn stent these were postdilated to 5 mm.  Follow-up imaging now demonstrated patency from the origin of the SFA down through the tibioperoneal trunk with both the peroneal and the posterior tibial and circuit and less than 10% residual stenosis throughout the system.  The sheath was removed forward and backbleeding was then used to flush the SFA profunda and aorta iliacs.  The patch was then copiously irrigated with heparinized saline and the suture line along the medial wall was closed with a running 6-0 Prolene.  Flow was then reestablished first to the profunda femoris and then the superficial femoral artery. Any gaps or bleeding sites in the suture line were easily controlled with a 6-0 Prolene suture. Doppler is then delivered onto the field and the SFA as well as the profunda femoris arteries were interrogated and found to have triphasic Doppler signals.  The right groin was then irrigated copiously with sterile saline and local anesthesia was then infiltrated into the groin wound with Exparel.  Subsequently Vistaseal and Surgicel were placed in the wound.    The incision was repaired with a double layer of 2-0 Vicryl, a double layer of 3-0 Vicryl, and a layer of 4-0 Monocryl in a subcuticular fashion.  The skin was cleaned, dried, and reinforced with Dermabond.  COMPLICATIONS: None  CONDITION: [name], M.D. [facility] Office: [phone]  05/29/2019, 3:31 PM

## 2019-05-30 LAB — CBC
HCT: 34.2 % — ABNORMAL LOW (ref 39.0–52.0)
Hemoglobin: 11.2 g/dL — ABNORMAL LOW (ref 13.0–17.0)
MCH: 30.3 pg (ref 26.0–34.0)
MCHC: 32.7 g/dL (ref 30.0–36.0)
MCV: 92.4 fL (ref 80.0–100.0)
Platelets: 152 10*3/uL (ref 150–400)
RBC: 3.7 MIL/uL — ABNORMAL LOW (ref 4.22–5.81)
RDW: 13.3 % (ref 11.5–15.5)
WBC: 8.6 10*3/uL (ref 4.0–10.5)
nRBC: 0 % (ref 0.0–0.2)

## 2019-05-30 LAB — GLUCOSE, CAPILLARY
Glucose-Capillary: 119 mg/dL — ABNORMAL HIGH (ref 70–99)
Glucose-Capillary: 149 mg/dL — ABNORMAL HIGH (ref 70–99)

## 2019-05-30 LAB — BASIC METABOLIC PANEL
Anion gap: 7 (ref 5–15)
BUN: 24 mg/dL — ABNORMAL HIGH (ref 8–23)
CO2: 26 mmol/L (ref 22–32)
Calcium: 8.1 mg/dL — ABNORMAL LOW (ref 8.9–10.3)
Chloride: 105 mmol/L (ref 98–111)
Creatinine, Ser: 1.57 mg/dL — ABNORMAL HIGH (ref 0.61–1.24)
GFR calc Af Amer: 45 mL/min — ABNORMAL LOW (ref >60)
GFR calc non Af Amer: 39 mL/min — ABNORMAL LOW (ref >60)
Glucose, Bld: 132 mg/dL — ABNORMAL HIGH (ref 70–99)
Potassium: 3.9 mmol/L (ref 3.5–5.1)
Sodium: 138 mmol/L (ref 135–145)

## 2019-05-30 MED ORDER — ACETAMINOPHEN 325 MG PO TABS: 650.0000 mg | ORAL_TABLET | Freq: Four times a day (QID) | ORAL | Status: AC | PRN

## 2019-05-30 MED ORDER — ASPIRIN 81 MG PO TBEC: 81.0000 mg | DELAYED_RELEASE_TABLET | Freq: Every day | ORAL | 3 refills | Status: DC

## 2019-05-30 MED ORDER — APIXABAN 5 MG PO TABS
5.0000 mg | ORAL_TABLET | Freq: Two times a day (BID) | ORAL | Status: DC
  Filled 2019-05-30: qty 1

## 2019-05-30 NOTE — Anesthesia Postprocedure Evaluation (Signed)
Anesthesia Post Note  Patient: [name]  Procedure(s) Performed: ENDARTERECTOMY FEMORAL (Right Groin) INSERTION OF ILIAC STENT ( AND SFA STENT) (Right Groin)  Patient location during evaluation: SICU Anesthesia Type: General Level of consciousness: awake and alert Pain management: pain level controlled Vital Signs Assessment: post-procedure vital signs reviewed and stable Respiratory status: patient remains intubated per anesthesia plan Cardiovascular status: stable Postop Assessment: no apparent nausea or vomiting Anesthetic complications: no     Last Vitals:  Vitals:   05/30/19 0600 05/30/19 0631  BP: 113/69 118/62  Pulse: 93 77  Resp:    Temp:    SpO2: 98% 99%    Last Pain:  Vitals:   05/30/19 0400  TempSrc:   PainSc: 0-No pain                 Rosebud Poles C

## 2019-05-30 NOTE — Discharge Instructions (Signed)
Vascular Surgery Discharge Instructions:  1) You may remove your dressings and shower as of Saturday (06/01/19).  Gently clean your incisions with soap and water and pat dry. 2) No strenuous activity or lifting greater than 10 pounds until your first postoperative follow-up in our office. 3) No driving until your first postoperative follow-up in our office.

## 2019-05-30 NOTE — Progress Notes (Addendum)
Per verbal order from Dr. Lucky Cowboy and PA patient can be discharged if able to tolerate lunch, void after catheter removal, and ambulate. Patient was able to ambulate two laps around ICU without issue, eat lunch, and void. Patient discharged home via sister and brother in law. Discharge instructions reviewed with patient, no questions reported. Patient asked to not take eliquis here and take home supply after discharge. [name]

## 2019-05-30 NOTE — Progress Notes (Signed)
OT Cancellation Note  Patient Details Name: [name] MRN: [id number] DOB: 1930/02/04   Cancelled Treatment:    Reason Eval/Treat Not Completed: OT screened, no needs identified, will sign off  Upon speaking with pt's RN, pt just walked two laps around unit (roughly 280 feet) with no assist, no AD, and no LOB. OT briefly screens strength and flexibility as it pertains to I/ADLs (as pt reports doing everything I'ly at home including mowing his lawn). Pt with grossly 4+/5 strength in UEs Bilaterally, demos flexibility appropriate for LB dressing. Demos safe Indep sit<>stand t/f to/from EOB. Pt is left in EOB sitting to eat lunch and RN notified. No OT needs ID'ed at this time. Will complete order. Thank you.   Gerrianne Scale, [place], OTR/L ascom [phone] 05/30/19, 12:13 PM

## 2019-05-30 NOTE — Progress Notes (Signed)
PT Cancellation Note  Patient Details Name: [name] MRN: [id number] DOB: [birth year]   Cancelled Treatment:    Reason Eval/Treat Not Completed: PT screened, no needs identified, will sign off: Per nursing pt ambulating laps around the nursing station independently without AD with nursing stating patient has no PT needs at this time. Will complete PT orders at this time but will reassess pt pending a change in status upon receipt of new PT orders.    Linus Salmons PT, DPT 05/30/19, 1:44 PM

## 2019-05-30 NOTE — Discharge Summary (Signed)
[facility]    Discharge Summary  Patient ID:  [name] MRN: [id number] DOB/AGE: 09/24/1930 86 y.o.  Admit date: 05/27/2019 Discharge date: 05/30/2019 Date of Surgery: 05/27/2019 - 05/29/2019 Surgeon: Surgeon(s): Dew, Erskine Squibb, MD Schnier, Dolores Lory, MD  Admission Diagnosis: Atherosclerotic peripheral vascular disease with rest pain [facility]) [I70.229]  Discharge Diagnoses:  Atherosclerotic peripheral vascular disease with rest pain [facility]) [I70.229]  Secondary Diagnoses: Past Medical History:  Diagnosis Date  . AAA (abdominal aortic aneurysm) ([place])   . Arthritis    in fingers and hands  . Cancer ([place])    skin cancer removed several  . Diabetes mellitus without complication ([place])   . Dysrhythmia    atrial fibrillation.  on eliquis  . History of kidney stones   . Hypertension   . Mitral insufficiency    per dr. Clayborn Bigness  . Myocardial infarct ([place]) 1990  . Transfusion history 1990   received several units of blood after CABG   Procedure(s): 05/27/19: 1. Ultrasound guidance for vascular access bilateral femoral arteries 2. Catheter placement into left common iliac artery from left femoral approach and into the aorta from right femoral approach 3. Aortogram and selective right lower extremity angiogram 4.  StarClose closure device left femoral artery  05/29/19: 1. Right common femoral, profunda femoris, and superficial femoral artery endarterectomies and patch angioplasty 2.   Catheter placement of the aorta from right femoral approach and into the right peroneal artery from the right femoral approach 3.   Aortogram and selective right lower extremity angiogram 4.   Lifestream stent placement to the distal aorta and most proximal right common iliac artery with a 12 mm diameter by 58 mm length stent 5.   Lifestream stent placement to the right common iliac artery with a 12 mm diameter by 58 mm length  lifestream stent 6.   Additional stent placement to the right external iliac artery with a 10 mm diameter by 10 cm length Viabahn stent 7.   Angioplasty of right peroneal artery and tibioperoneal trunk with 3 mm diameter angioplasty balloon 8.   Angioplasty of the right popliteal artery with 4 mm diameter Lutonix drug-coated angioplasty balloon 9.   Angioplasty of the entire right SFA with a 5 mm diameter by 30 cm length Lutonix drug-coated angioplasty balloon 10.  Viabahn stent placement to the right SFA and most proximal popliteal artery with a 6 mm diameter by 25 cm length and a 6 mm diameter by 7.5 cm length Viabahn stent for high-grade residual stenosis after angioplasty  Discharged Condition: Good  HPI / Hospital Course:  Patient is a 86 y.o.male with worsening pain of the right leg now consistent with rest pain. The patient has noninvasive study showing an ABI of 0.15 on the right.  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 The patient is brought in for angiography for further evaluation and potential treatment.  Due to the limb threatening nature of the situation, angiogram was performed for attempted limb salvage. On 05/27/19, the patient underwent:  1. Ultrasound guidance for vascular access bilateral femoral arteries 2. Catheter placement into left common iliac artery from left femoral approach and into the aorta from right femoral approach 3. Aortogram and selective right lower extremity angiogram 4.  StarClose closure device left femoral artery  Findings: The right external iliac artery had to highly calcific stenoses  with the lower of the 2 being the more severe in the 80 to 85% range. Occlusion of the right common femoral artery in the proximal portions of the SFA and profunda femoris arteries.  The SFA was then diffusely  diseased with multiple areas of high-grade stenosis or occlusion of a short segments down to the above-knee popliteal artery where the vessel seem to normalize.  The below-knee popliteal artery seemed relatively normal.  The anterior tibial artery was chronically occluded.  The tibioperoneal trunk fed both peroneal and the posterior tibial artery.  The posterior tibial artery seem to have multiple segments of at least moderate stenosis but opacification was quite poor due to the poor inflow.  The peroneal artery was small but did provide a second runoff vessel distally.  Due to the severity of the patient's disease and inability to repair it endovascularly 05/29/19 the patient underwent:  1. Right common femoral, profunda femoris, and superficial femoral artery endarterectomies and patch angioplasty 2.   Catheter placement of the aorta from right femoral approach and into the right peroneal artery from the right femoral approach 3.   Aortogram and selective right lower extremity angiogram 4.   Lifestream stent placement to the distal aorta and most proximal right common iliac artery with a 12 mm diameter by 58 mm length stent 5.   Lifestream stent placement to the right common iliac artery with a 12 mm diameter by 58 mm length lifestream stent 6.   Additional stent placement to the right external iliac artery with a 10 mm diameter by 10 cm length Viabahn stent 7.   Angioplasty of right peroneal artery and tibioperoneal trunk with 3 mm diameter angioplasty balloon 8.   Angioplasty of the right popliteal artery with 4 mm diameter Lutonix drug-coated angioplasty balloon 9.   Angioplasty of the entire right SFA with a 5 mm diameter by 30 cm length Lutonix drug-coated angioplasty balloon 10.  Viabahn stent placement to the right SFA and most proximal popliteal artery with a 6 mm diameter by 25 cm length and a 6 mm diameter by 7.5 cm length Viabahn stent for high-grade residual stenosis after angioplasty  He  tolerated the procedure well was transferred from the operating room to the ICU for observation overnight.  Patient's night of surgery was unremarkable.  During patient's brief postoperative stay, his diet was advanced, his Foley was removed, his pain was controlled to the use of p.o. pain medication he was ambulating at baseline.  Day of discharge, the patient was afebrile with stable vital signs and an improved physical exam.  Extubated: POD # 0  Physical exam:  Alert and oriented x3, No acute distress Cardiovascular: Irregularly irregular Pulmonary: Clear to auscultation bilaterally Abdomen: Soft, nontender, nondistended, positive bowel sounds Right groin:  Access site: Incision clean dry intact.  Dermabond intact.  No swelling or drainage noted. Right lower extremity: Thigh soft.  Calf soft.  Extremities warm distally toes. Palpable DP pulse.  Good capillary refill.  Motor/sensory is intact.  Labs: As below  Complications: None  Consults: None  Significant Diagnostic Studies: CBC Lab Results  Component Value Date   WBC 8.6 05/30/2019   HGB 11.2 (L) 05/30/2019   HCT 34.2 (L) 05/30/2019   MCV 92.4 05/30/2019   PLT 152 05/30/2019   BMET    Component Value Date/Time   NA 138 05/30/2019 0750   NA 141 03/18/2015 1115   NA 144 12/20/2011 0056   K 3.9 05/30/2019 0750   K 3.3 (  L) 12/20/2011 0056   CL 105 05/30/2019 0750   CL 108 (H) 12/20/2011 0056   CO2 26 05/30/2019 0750   CO2 27 12/20/2011 0056   GLUCOSE 132 (H) 05/30/2019 0750   GLUCOSE 158 (H) 12/20/2011 0056   BUN 24 (H) 05/30/2019 0750   BUN 28 (H) 03/18/2015 1115   BUN 25 (H) 12/20/2011 0056   CREATININE 1.57 (H) 05/30/2019 0750   CREATININE 1.54 (H) 12/20/2011 0056   CALCIUM 8.1 (L) 05/30/2019 0750   CALCIUM 8.8 12/20/2011 0056   GFRNONAA 39 (L) 05/30/2019 0750   GFRNONAA 42 (L) 12/20/2011 0056   GFRAA 45 (L) 05/30/2019 0750   GFRAA 49 (L) 12/20/2011 0056   COAG Lab Results  Component Value Date   INR 1.1  05/29/2019   INR 1.0 12/20/2011   Disposition:  Discharge to :Home  Allergies as of 05/30/2019      Reactions   Morphine And Related Nausea And Vomiting   Sulfa Antibiotics Rash      Medication List    STOP taking these medications   diltiazem 180 MG 24 hr capsule Commonly known as: TIAZAC     TAKE these medications   acetaminophen 325 MG tablet Commonly known as: TYLENOL Take 2 tablets (650 mg total) by mouth every 6 (six) hours as needed for mild pain or fever.   aspirin 81 MG EC tablet Take 1 tablet (81 mg total) by mouth daily. Start taking on: May 31, 2019   atorvastatin 40 MG tablet Commonly known as: LIPITOR Take 40 mg by mouth daily.   Bayer Contour Test test strip Generic drug: glucose blood 1 strip by Does not apply route 3 (three) times daily.   diltiazem 120 MG 24 hr capsule Commonly known as: CARDIZEM CD Take 120 mg by mouth twice daily   Eliquis 5 MG Tabs tablet Generic drug: apixaban Take 1 tablet (5 mg total) by mouth 2 (two) times daily.   Fish Oil 1000 MG Caps Take 1,000 mg by mouth daily.   GoodSense Eye Drops 0.05 % ophthalmic solution Generic drug: tetrahydrozoline Place 1 drop into both eyes daily as needed (dry eyes).   lisinopril-hydrochlorothiazide 20-12.5 MG tablet Commonly known as: ZESTORETIC Take 1 tablet by mouth 2 (two) times daily.   triamcinolone cream 0.5 % Commonly known as: KENALOG Apply topically.   vitamin B-12 1000 MCG tablet Commonly known as: CYANOCOBALAMIN Take 1,000 mcg by mouth 2 (two) times a week.   Vitamin D3 125 MCG (5000 UT) Caps Take 5,000 Units by mouth daily.      Verbal and written Discharge instructions given to the patient. Wound care per Discharge AVS Follow-up Information    Kris Hartmann, NP Follow up in 1 week(s).   Specialty: Vascular Surgery Why: First post-op incision check. No studies needed.  Contact information: [place] [id number] [phone]           Signed: Sela Hua, PA-C  05/30/2019, 10:45 AM

## 2019-05-31 LAB — SURGICAL PATHOLOGY

## 2019-06-01 LAB — BPAM RBC
Blood Product Expiration Date: 202105142359
Blood Product Expiration Date: 202105182359
Unit Type and Rh: 5100
Unit Type and Rh: 5100

## 2019-06-01 LAB — TYPE AND SCREEN
ABO/RH(D): A POS
Antibody Screen: POSITIVE
Unit division: 0
Unit division: 0

## 2019-06-01 LAB — PREPARE RBC (CROSSMATCH)

## 2019-06-06 ENCOUNTER — Other Ambulatory Visit: Payer: Self-pay

## 2019-06-06 ENCOUNTER — Ambulatory Visit (INDEPENDENT_AMBULATORY_CARE_PROVIDER_SITE_OTHER): Payer: Medicare PPO | Admitting: Nurse Practitioner

## 2019-06-06 VITALS — BP 131/72 | HR 98 | Resp 16 | Wt 161.4 lb

## 2019-06-06 DIAGNOSIS — I10 Essential (primary) hypertension: Secondary | ICD-10-CM

## 2019-06-06 DIAGNOSIS — I70229 Atherosclerosis of native arteries of extremities with rest pain, unspecified extremity: Secondary | ICD-10-CM

## 2019-06-06 NOTE — Progress Notes (Signed)
Subjective:    Patient ID: [name], male    DOB: 27-Apr-1930, 86 y.o.   MRN: [id number] Chief Complaint  Patient presents with  . Follow-up    [facility] 1week post femoral endarteretomy    Today the patient follows up after right femoral endarterectomy.  The patient underwent:  PROCEDURE: 1. Right common femoral, superficial femoral and profunda femoris endarterectomy with Cormatrix patch angioplasty. 2. Open angioplasty and stent placement of the distal aorta. 3. Open angioplasty and stent placement of the right common iliac artery. 4. Open angioplasty and stent placement of the right external iliac artery. 5. Open angioplasty and stent placement of the right superficial femoral artery and popliteal artery. 6. Open angioplasty of the peroneal and tibioperoneal trunk  Today the patient states that his leg feels much better the only complaint is some swelling in his right lower extremity.  The patient does state that the swelling decreases when he elevates his lower extremity.  He denies any rest pain or claudication-like symptoms at this time.  The patient has had very little pain his only been taking Tylenol.  The patient denies any evidence of infection or dehiscence of his wound.  The wound has no evidence of infection today as well as intact.  There is some bruising present.  The patient does report that he has not been taking his aspirin following his discharge.  He thought that the Eliquis was only thing that was needed.   Review of Systems  Cardiovascular: Positive for leg swelling.  Skin: Positive for wound.  Hematological: Bruises/bleeds easily.  All other systems reviewed and are negative.      Objective:   Physical Exam Vitals reviewed.  Cardiovascular:     Rate and Rhythm: Normal rate and regular rhythm.     Pulses: Normal pulses.     Heart sounds: Normal heart sounds.  Musculoskeletal:     Right lower leg: Edema present.  Neurological:     Mental Status: He is  alert and oriented to person, place, and time.  Psychiatric:        Mood and Affect: Mood normal.        Behavior: Behavior normal.        Thought Content: Thought content normal.        Judgment: Judgment normal.     BP 131/72 (BP Location: Right Arm)   Pulse 98   Resp 16   Wt 161 lb 6.4 oz (73.2 kg)   BMI 21.89 kg/m   Past Medical History:  Diagnosis Date  . AAA (abdominal aortic aneurysm) ([place])   . Arthritis    in fingers and hands  . Cancer ([place])    skin cancer removed several  . Diabetes mellitus without complication ([place])   . Dysrhythmia    atrial fibrillation.  on eliquis  . History of kidney stones   . Hypertension   . Mitral insufficiency    per dr. Clayborn Bigness  . Myocardial infarct ([place]) 1990  . Transfusion history 1990   received several units of blood after CABG    Social History   Socioeconomic History  . Marital status: Widowed    Spouse name: Not on file  . Number of children: Not on file  . Years of education: Not on file  . Highest education level: Not on file  Occupational History  . Not on file  Tobacco Use  . Smoking status: Former Smoker    Years: 30.00    Types: Cigarettes  Quit date: 01/25/1988    Years since quitting: 31.3  . Smokeless tobacco: Never Used  Substance and Sexual Activity  . Alcohol use: No  . Drug use: No  . Sexual activity: Not Currently  Other Topics Concern  . Not on file  Social History Narrative  . Not on file   Social Determinants of Health   Financial Resource Strain:   . Difficulty of Paying Living Expenses:   Food Insecurity:   . Worried About Charity fundraiser in the Last Year:   . Arboriculturist in the Last Year:   Transportation Needs:   . Film/video editor (Medical):   Marland Kitchen Lack of Transportation (Non-Medical):   Physical Activity:   . Days of Exercise per Week:   . Minutes of Exercise per Session:   Stress:   . Feeling of Stress :   Social Connections:   . Frequency of Communication  with Friends and Family:   . Frequency of Social Gatherings with Friends and Family:   . Attends Religious Services:   . Active Member of Clubs or Organizations:   . Attends Archivist Meetings:   Marland Kitchen Marital Status:   Intimate Partner Violence:   . Fear of Current or Ex-Partner:   . Emotionally Abused:   Marland Kitchen Physically Abused:   . Sexually Abused:     Past Surgical History:  Procedure Laterality Date  . ABDOMINAL AORTIC ANEURYSM REPAIR  2005, [facility]  . Cardiac bypass  1990   [facility]. triple cabg  . CARDIAC CATHETERIZATION  1990   stents placed but ultimately clotted and shut down.  Marland Kitchen CARDIAC SURGERY  0000000   duplicate entry  . CHOLECYSTECTOMY N/A 11/10/2008   Dr Bary Castilla  . CORONARY ARTERY BYPASS GRAFT  1990   x 3. performed after coronary stents failed  . ENDARTERECTOMY FEMORAL Right 05/29/2019   Procedure: ENDARTERECTOMY FEMORAL;  Surgeon: Algernon Huxley, MD;  Location: [facility] ORS;  Service: Vascular;  Laterality: Right;  . EYE SURGERY Bilateral    cataract extractions with iol  . INGUINAL HERNIA REPAIR Left 08/07/2017   Medium Ultra Pro mesh Surgeon: Robert Bellow, MD;  Location: [facility] ORS;  Service: General;  Laterality: Left;  . INSERTION OF ILIAC STENT Right 05/29/2019   Procedure: INSERTION OF ILIAC STENT ( AND SFA STENT);  Surgeon: Algernon Huxley, MD;  Location: [facility] ORS;  Service: Vascular;  Laterality: Right;  . LOWER EXTREMITY ANGIOGRAPHY Right 05/27/2019   Procedure: LOWER EXTREMITY ANGIOGRAPHY;  Surgeon: Algernon Huxley, MD;  Location: [place] [facility];  Service: Cardiovascular;  Laterality: Right;    Family History  Problem Relation Age of Onset  . Cerebral aneurysm Mother        died at 68  . CAD Father        died at 50    Allergies  Allergen Reactions  . Morphine And Related Nausea And Vomiting  . Sulfa Antibiotics Rash       Assessment & Plan:   1. Atherosclerotic peripheral vascular disease with rest pain ([place]) Patient's wound is doing well.   Patient's wound is well approximated with no signs symptoms of infection.  Patient advised to continue with light activity for approximately the next 6 weeks or so.  Otherwise patient is able to resume activities of daily living as he is able to tolerate.  He is also advised to utilize medical grade 1 compression stocking for his swelling.  He should elevate his lower  extremity when he is not ambulating.  2. Essential hypertension Continue antihypertensive medications as already ordered, these medications have been reviewed and there are no changes at this time.   Current Outpatient Medications on File Prior to Visit  Medication Sig Dispense Refill  . acetaminophen (TYLENOL) 325 MG tablet Take 2 tablets (650 mg total) by mouth every 6 (six) hours as needed for mild pain or fever.    Marland Kitchen aspirin EC 81 MG EC tablet Take 1 tablet (81 mg total) by mouth daily. 90 tablet 3  . atorvastatin (LIPITOR) 40 MG tablet Take 40 mg by mouth daily.    . Cholecalciferol (VITAMIN D3) 5000 units CAPS Take 5,000 Units by mouth daily.    Marland Kitchen diltiazem (CARDIZEM CD) 120 MG 24 hr capsule Take 120 mg by mouth twice daily    . ELIQUIS 5 MG TABS tablet Take 1 tablet (5 mg total) by mouth 2 (two) times daily. 1 tablet 0  . glucose blood (BAYER CONTOUR TEST) test strip 1 strip by Does not apply route 3 (three) times daily.    Marland Kitchen lisinopril-hydrochlorothiazide (PRINZIDE,ZESTORETIC) 20-12.5 MG tablet Take 1 tablet by mouth 2 (two) times daily.     . Omega-3 Fatty Acids (FISH OIL) 1000 MG CAPS Take 1,000 mg by mouth daily.     Marland Kitchen tetrahydrozoline (GOODSENSE EYE DROPS) 0.05 % ophthalmic solution Place 1 drop into both eyes daily as needed (dry eyes).    . triamcinolone cream (KENALOG) 0.5 % Apply topically.    . vitamin B-12 (CYANOCOBALAMIN) 1000 MCG tablet Take 1,000 mcg by mouth 2 (two) times a week.     No current facility-administered medications on file prior to visit.    There are no Patient Instructions on file for this  visit. No follow-ups on file.   Kris Hartmann, NP

## 2019-07-02 ENCOUNTER — Telehealth (INDEPENDENT_AMBULATORY_CARE_PROVIDER_SITE_OTHER): Payer: Self-pay | Admitting: Vascular Surgery

## 2019-07-02 ENCOUNTER — Other Ambulatory Visit (INDEPENDENT_AMBULATORY_CARE_PROVIDER_SITE_OTHER): Payer: Self-pay | Admitting: Nurse Practitioner

## 2019-07-02 DIAGNOSIS — M7989 Other specified soft tissue disorders: Secondary | ICD-10-CM

## 2019-07-02 NOTE — Telephone Encounter (Signed)
Called stating his leg is swollen all the way up to his hip. (has been since the surgery he says) His doctor asked him to call us to see if he could be seen sooner than 07-17-19. He was last seen 06-06-19. (1 week post Endarterectomy.)  Please advise.

## 2019-07-02 NOTE — Telephone Encounter (Signed)
Let's get him in tomorrow if we can with a DVT study for that leg and ABIs

## 2019-07-03 ENCOUNTER — Ambulatory Visit (INDEPENDENT_AMBULATORY_CARE_PROVIDER_SITE_OTHER): Payer: Medicare PPO

## 2019-07-03 ENCOUNTER — Other Ambulatory Visit (INDEPENDENT_AMBULATORY_CARE_PROVIDER_SITE_OTHER): Payer: Self-pay | Admitting: Nurse Practitioner

## 2019-07-03 ENCOUNTER — Other Ambulatory Visit: Payer: Self-pay

## 2019-07-03 ENCOUNTER — Ambulatory Visit (INDEPENDENT_AMBULATORY_CARE_PROVIDER_SITE_OTHER): Payer: Medicare PPO | Admitting: Nurse Practitioner

## 2019-07-03 ENCOUNTER — Encounter (INDEPENDENT_AMBULATORY_CARE_PROVIDER_SITE_OTHER): Payer: Self-pay | Admitting: Nurse Practitioner

## 2019-07-03 VITALS — BP 154/73 | HR 77 | Resp 16 | Wt 167.0 lb

## 2019-07-03 DIAGNOSIS — I10 Essential (primary) hypertension: Secondary | ICD-10-CM

## 2019-07-03 DIAGNOSIS — I70229 Atherosclerosis of native arteries of extremities with rest pain, unspecified extremity: Secondary | ICD-10-CM

## 2019-07-03 DIAGNOSIS — M7989 Other specified soft tissue disorders: Secondary | ICD-10-CM

## 2019-07-03 DIAGNOSIS — Z95820 Peripheral vascular angioplasty status with implants and grafts: Secondary | ICD-10-CM

## 2019-07-03 DIAGNOSIS — E785 Hyperlipidemia, unspecified: Secondary | ICD-10-CM

## 2019-07-03 NOTE — Progress Notes (Signed)
Subjective:    Patient ID: [name], male    DOB: [birth year], 86 y.o.   MRN: [id number] Chief Complaint  Patient presents with  . Follow-up    ultrasound follow up    The patient presents today after intervention on 05/29/2019.  At the postoperative visit on 06/06/2019 the patient was doing well with no evidence of dehiscence of his wound with only some swelling in the right lower extremity.  The patient had extensive intervention including:  PROCEDURE: 1.         Right common femoral, superficial femoral and profunda femoris endarterectomy with Cormatrix patch angioplasty. 2.         Open angioplasty and stent placement of the distal aorta. 3.         Open angioplasty and stent placement of the right common iliac artery. 4.         Open angioplasty and stent placement of the right external iliac artery. 5.         Open angioplasty and stent placement of the right superficial femoral artery and popliteal artery. 6.         Open angioplasty of the peroneal and tibioperoneal trunk   Today the patient's incision is some very nearly healed however he has had extensive swelling in his right lower extremity.  The patient notes that the swelling is mostly contained within his inner thigh area extending from the proximal portion to just proximal to his knee.  This area is circumscribed and tender.  Walking also worsens the discomfort.  The patient notes that during the evening the swelling decreases however it somewhat gets a little worse as the day progresses.  He denies any fever, chills, nausea, vomiting or diarrhea.  Today noninvasive studies show a ABI of 1.06 on the right with biphasic waveforms and good toe waveforms.  The left lower extremity has an ABI 0.51 with monophasic waveforms and dampened toe waveforms.  Patient also underwent a DVT study which showed no evidence of DVT or superficial venous thrombosis however it was noted that there is a hypoechoic structure extending from  anterior to the right common femoral vein/saphenofemoral junction to medial anteromedial distal groin area which is roughly 9.3 cm long with maximum AP/transverse diameters of 1.7 x 2.3 cm.  There is no flow visualized except for minimal, to and fro flow in the more distal and superficial segment of this area but no connection to the vasculature was visualized.   Review of Systems  Cardiovascular: Positive for leg swelling.  Skin: Positive for wound.  All other systems reviewed and are negative.      Objective:   Physical Exam Vitals reviewed.  HENT:     Head: Normocephalic.  Cardiovascular:     Rate and Rhythm: Normal rate and regular rhythm.     Pulses: Normal pulses.     Heart sounds: Normal heart sounds.  Musculoskeletal:        General: Swelling present.  Neurological:     Mental Status: He is alert and oriented to person, place, and time.  Psychiatric:        Mood and Affect: Mood normal.        Behavior: Behavior normal.        Thought Content: Thought content normal.        Judgment: Judgment normal.     BP (!) 154/73 (BP Location: Left Arm)   Pulse 77   Resp 16   Wt 167 lb (75.8 kg)  BMI 22.65 kg/m   Past Medical History:  Diagnosis Date  . AAA (abdominal aortic aneurysm) ([place])   . Arthritis    in fingers and hands  . Cancer ([place])    skin cancer removed several  . Diabetes mellitus without complication ([place])   . Dysrhythmia    atrial fibrillation.  on eliquis  . History of kidney stones   . Hypertension   . Mitral insufficiency    per dr. Clayborn Bigness  . Myocardial infarct ([place]) 1990  . Transfusion history 1990   received several units of blood after CABG    Social History   Socioeconomic History  . Marital status: Widowed    Spouse name: Not on file  . Number of children: Not on file  . Years of education: Not on file  . Highest education level: Not on file  Occupational History  . Not on file  Tobacco Use  . Smoking status: Former Smoker     Years: 30.00    Types: Cigarettes    Quit date: 01/25/1988    Years since quitting: 31.4  . Smokeless tobacco: Never Used  Substance and Sexual Activity  . Alcohol use: No  . Drug use: No  . Sexual activity: Not Currently  Other Topics Concern  . Not on file  Social History Narrative  . Not on file   Social Determinants of Health   Financial Resource Strain:   . Difficulty of Paying Living Expenses:   Food Insecurity:   . Worried About Charity fundraiser in the Last Year:   . Arboriculturist in the Last Year:   Transportation Needs:   . Film/video editor (Medical):   Marland Kitchen Lack of Transportation (Non-Medical):   Physical Activity:   . Days of Exercise per Week:   . Minutes of Exercise per Session:   Stress:   . Feeling of Stress :   Social Connections:   . Frequency of Communication with Friends and Family:   . Frequency of Social Gatherings with Friends and Family:   . Attends Religious Services:   . Active Member of Clubs or Organizations:   . Attends Archivist Meetings:   Marland Kitchen Marital Status:   Intimate Partner Violence:   . Fear of Current or Ex-Partner:   . Emotionally Abused:   Marland Kitchen Physically Abused:   . Sexually Abused:     Past Surgical History:  Procedure Laterality Date  . ABDOMINAL AORTIC ANEURYSM REPAIR  2005, [facility]  . Cardiac bypass  1990   [facility]. triple cabg  . CARDIAC CATHETERIZATION  1990   stents placed but ultimately clotted and shut down.  Marland Kitchen CARDIAC SURGERY  6063   duplicate entry  . CHOLECYSTECTOMY N/A 11/10/2008   Dr Bary Castilla  . CORONARY ARTERY BYPASS GRAFT  1990   x 3. performed after coronary stents failed  . ENDARTERECTOMY FEMORAL Right 05/29/2019   Procedure: ENDARTERECTOMY FEMORAL;  Surgeon: Algernon Huxley, MD;  Location: [facility] ORS;  Service: Vascular;  Laterality: Right;  . EYE SURGERY Bilateral    cataract extractions with iol  . INGUINAL HERNIA REPAIR Left 08/07/2017   Medium Ultra Pro mesh Surgeon: Robert Bellow, MD;   Location: [facility] ORS;  Service: General;  Laterality: Left;  . INSERTION OF ILIAC STENT Right 05/29/2019   Procedure: INSERTION OF ILIAC STENT ( AND SFA STENT);  Surgeon: Algernon Huxley, MD;  Location: [facility] ORS;  Service: Vascular;  Laterality: Right;  . LOWER EXTREMITY ANGIOGRAPHY Right  05/27/2019   Procedure: LOWER EXTREMITY ANGIOGRAPHY;  Surgeon: Algernon Huxley, MD;  Location: [place] [facility];  Service: Cardiovascular;  Laterality: Right;    Family History  Problem Relation Age of Onset  . Cerebral aneurysm Mother        died at 82  . CAD Father        died at 78    Allergies  Allergen Reactions  . Morphine And Related Nausea And Vomiting  . Sulfa Antibiotics Rash       Assessment & Plan:   1. Atherosclerotic peripheral vascular disease with rest pain ([place]) Today's noninvasive studies show that well the previous endovascular intervention is intact, the patient is seen to develop a either seroma or hematoma.  Based on the size it is highly unlikely that this is a pseudoaneurysm also there is no pulsatility or flow heard within the structure.  There is also very little communication with the arterial system.  Because this is likely a hematoma or seroma and based on size the patient may be getting some venous compression, slightly worsening his lower extremity swelling.  In the instance of either hematoma or seroma the best course of action is to allow it to resolve on its own without intervention.  We will have the patient return to the office in 2 weeks to reevaluate if the area has decreased.  2. Hyperlipidemia, unspecified hyperlipidemia type Continue statin as ordered and reviewed, no changes at this time   3. Essential hypertension Continue antihypertensive medications as already ordered, these medications have been reviewed and there are no changes at this time.    Current Outpatient Medications on File Prior to Visit  Medication Sig Dispense Refill  . acetaminophen (TYLENOL)  325 MG tablet Take 2 tablets (650 mg total) by mouth every 6 (six) hours as needed for mild pain or fever.    Marland Kitchen atorvastatin (LIPITOR) 40 MG tablet Take 40 mg by mouth daily.    . Cholecalciferol (VITAMIN D3) 5000 units CAPS Take 5,000 Units by mouth daily.    Marland Kitchen diltiazem (CARDIZEM CD) 120 MG 24 hr capsule Take 120 mg by mouth twice daily    . diltiazem (TIAZAC) 180 MG 24 hr capsule Take by mouth.    Arne Cleveland 5 MG TABS tablet Take 1 tablet (5 mg total) by mouth 2 (two) times daily. 1 tablet 0  . glucose blood (BAYER CONTOUR TEST) test strip 1 strip by Does not apply route 3 (three) times daily.    Marland Kitchen lisinopril-hydrochlorothiazide (PRINZIDE,ZESTORETIC) 20-12.5 MG tablet Take 1 tablet by mouth 2 (two) times daily.     . Omega-3 Fatty Acids (FISH OIL) 1000 MG CAPS Take 1,000 mg by mouth daily.     Marland Kitchen tetrahydrozoline (GOODSENSE EYE DROPS) 0.05 % ophthalmic solution Place 1 drop into both eyes daily as needed (dry eyes).    . triamcinolone cream (KENALOG) 0.5 % Apply topically.    . vitamin B-12 (CYANOCOBALAMIN) 1000 MCG tablet Take 1,000 mcg by mouth 2 (two) times a week.    Marland Kitchen aspirin EC 81 MG EC tablet Take 1 tablet (81 mg total) by mouth daily. (Patient not taking: Reported on 07/03/2019) 90 tablet 3   No current facility-administered medications on file prior to visit.    There are no Patient Instructions on file for this visit. No follow-ups on file.   Kris Hartmann, NP

## 2019-07-04 ENCOUNTER — Telehealth (INDEPENDENT_AMBULATORY_CARE_PROVIDER_SITE_OTHER): Payer: Self-pay

## 2019-07-04 NOTE — Telephone Encounter (Signed)
I left a voicemail making the patient aware that I will be calling Tramadol #20 into pharmacy for pain relief

## 2019-07-04 NOTE — Telephone Encounter (Signed)
We can send in some tramadol for the patient if he would like

## 2019-07-05 ENCOUNTER — Telehealth (INDEPENDENT_AMBULATORY_CARE_PROVIDER_SITE_OTHER): Payer: Self-pay

## 2019-07-05 NOTE — Telephone Encounter (Signed)
The sister called an left a message on the nurses line asking about about a procedure her brother was telling her about for his leg and that  He has spoke with Dr. Lucky Cowboy over the phone. I made his sister aware that as far as I could see from his last visit note not intervention was going to be mae and the pt would return in two week and that he was also prescribed tramadol for pain.

## 2019-07-16 ENCOUNTER — Encounter (INDEPENDENT_AMBULATORY_CARE_PROVIDER_SITE_OTHER): Payer: Self-pay | Admitting: Vascular Surgery

## 2019-07-16 ENCOUNTER — Ambulatory Visit (INDEPENDENT_AMBULATORY_CARE_PROVIDER_SITE_OTHER): Payer: Medicare PPO | Admitting: Vascular Surgery

## 2019-07-16 ENCOUNTER — Other Ambulatory Visit: Payer: Self-pay

## 2019-07-16 VITALS — BP 153/77 | HR 69 | Resp 16 | Wt 162.8 lb

## 2019-07-16 DIAGNOSIS — I70221 Atherosclerosis of native arteries of extremities with rest pain, right leg: Secondary | ICD-10-CM

## 2019-07-16 NOTE — Progress Notes (Signed)
Patient ID: [name], male   DOB: 1930-12-22, 86 y.o.   MRN: [id number]  Chief Complaint  Patient presents with  . Follow-up    2wk follow up    HPI [name] is a 86 y.o. male.  Patient returns in follow-up.  His right leg swelling is improved but not resolved.  He is walking better.  His wound is healing well.  He has some mild firmness in the upper thigh which is likely a resolving hematoma.  All in all, he is slowly improving after major surgery about 6 weeks ago.   Past Medical History:  Diagnosis Date  . AAA (abdominal aortic aneurysm) ([place])   . Actinic keratosis   . Arthritis    in fingers and hands  . Cancer ([place])    skin cancer removed several  . Diabetes mellitus without complication ([place])   . Dysrhythmia    atrial fibrillation.  on eliquis  . History of kidney stones   . Hx of basal cell carcinoma 06/30/2016   L proximal mandible  . Hx of basal cell carcinoma 07/19/2016   L medial lower eyelid  . Hx of basal cell carcinoma 09/20/2016   R upper back lateral  . Hx of basal cell carcinoma 12/01/2016   L post shoulder sup medial scapula  . Hx of basal cell carcinoma 12/01/2016   L forehead, 2.0cm above med brow  . Hx of basal cell carcinoma 12/17/2018   L forehead aboove medial brow  . Hx of melanoma in situ 08/16/2016   L proximal mandible/infra auricular  . Hypertension   . Mitral insufficiency    per dr. Clayborn Bigness  . Myocardial infarct ([place]) 1990  . Squamous cell carcinoma of skin 11/01/2017   R superior sideburn in hairline  . Squamous cell carcinoma of skin 01/03/2018   L medial dorsum wrist lateral and medial  . Squamous cell carcinoma of skin 01/03/2018   L proximal thumb  . Squamous cell carcinoma of skin 04/12/2018   L dorsum proximal index finger  . Squamous cell carcinoma of skin 04/12/2018   L dorsum medial proximal thumb  . Squamous cell carcinoma of skin 07/04/2018   R superior sideburn  . Squamous cell carcinoma of skin  07/04/2018   L dorsum proximal index finger  . Squamous cell carcinoma of skin 11/13/2018   R helix  . Transfusion history 1990   received several units of blood after CABG    Past Surgical History:  Procedure Laterality Date  . ABDOMINAL AORTIC ANEURYSM REPAIR  2005, [facility]  . Cardiac bypass  1990   [facility]. triple cabg  . CARDIAC CATHETERIZATION  1990   stents placed but ultimately clotted and shut down.  Marland Kitchen CARDIAC SURGERY  6546   duplicate entry  . CHOLECYSTECTOMY N/A 11/10/2008   Dr Bary Castilla  . CORONARY ARTERY BYPASS GRAFT  1990   x 3. performed after coronary stents failed  . ENDARTERECTOMY FEMORAL Right 05/29/2019   Procedure: ENDARTERECTOMY FEMORAL;  Surgeon: Algernon Huxley, MD;  Location: [facility] ORS;  Service: Vascular;  Laterality: Right;  . EYE SURGERY Bilateral    cataract extractions with iol  . INGUINAL HERNIA REPAIR Left 08/07/2017   Medium Ultra Pro mesh Surgeon: Robert Bellow, MD;  Location: [facility] ORS;  Service: General;  Laterality: Left;  . INSERTION OF ILIAC STENT Right 05/29/2019   Procedure: INSERTION OF ILIAC STENT ( AND SFA STENT);  Surgeon: Algernon Huxley, MD;  Location: [facility]  ORS;  Service: Vascular;  Laterality: Right;  . LOWER EXTREMITY ANGIOGRAPHY Right 05/27/2019   Procedure: LOWER EXTREMITY ANGIOGRAPHY;  Surgeon: Algernon Huxley, MD;  Location: [place] [facility];  Service: Cardiovascular;  Laterality: Right;      Allergies  Allergen Reactions  . Morphine And Related Nausea And Vomiting  . Sulfa Antibiotics Rash    Current Outpatient Medications  Medication Sig Dispense Refill  . acetaminophen (TYLENOL) 325 MG tablet Take 2 tablets (650 mg total) by mouth every 6 (six) hours as needed for mild pain or fever.    Marland Kitchen atorvastatin (LIPITOR) 40 MG tablet Take 40 mg by mouth daily.    . Cholecalciferol (VITAMIN D3) 5000 units CAPS Take 5,000 Units by mouth daily.    Marland Kitchen diltiazem (CARDIZEM CD) 120 MG 24 hr capsule Take 120 mg by mouth twice daily    . diltiazem  (TIAZAC) 180 MG 24 hr capsule Take by mouth.    Arne Cleveland 5 MG TABS tablet Take 1 tablet (5 mg total) by mouth 2 (two) times daily. 1 tablet 0  . glucose blood (BAYER CONTOUR TEST) test strip 1 strip by Does not apply route 3 (three) times daily.    Marland Kitchen lisinopril-hydrochlorothiazide (PRINZIDE,ZESTORETIC) 20-12.5 MG tablet Take 1 tablet by mouth 2 (two) times daily.     . Omega-3 Fatty Acids (FISH OIL) 1000 MG CAPS Take 1,000 mg by mouth daily.     Marland Kitchen tetrahydrozoline (GOODSENSE EYE DROPS) 0.05 % ophthalmic solution Place 1 drop into both eyes daily as needed (dry eyes).    . triamcinolone cream (KENALOG) 0.5 % Apply topically.    . vitamin B-12 (CYANOCOBALAMIN) 1000 MCG tablet Take 1,000 mcg by mouth 2 (two) times a week.    Marland Kitchen aspirin EC 81 MG EC tablet Take 1 tablet (81 mg total) by mouth daily. (Patient not taking: Reported on 07/03/2019) 90 tablet 3   No current facility-administered medications for this visit.        Physical Exam BP (!) 153/77 (BP Location: Left Arm)   Pulse 69   Resp 16   Wt 162 lb 12.8 oz (73.8 kg)   BMI 22.08 kg/m  Gen:  WD/WN, NAD Skin: incision C/D/I, 1-2+ right lower extremity edema distally with some healing hematoma in the upper thigh.  No erythema or drainage.     Assessment/Plan:  Atherosclerosis of native arteries of extremity with rest pain ([place]) Patient's wrist pain is resolved after extensive revascularization last month.  Overall he is slowly improving.  His reperfusion swelling is improving but still significant.  We discussed elevation and compression as well as increasing his activity.  We will plan to see him back in 3 months with noninvasive studies for follow-up.      Leotis Pain 07/16/2019, 12:20 PM   This note was created with Dragon medical transcription system.  Any errors from dictation are unintentional.

## 2019-07-16 NOTE — Assessment & Plan Note (Signed)
Patient's wrist pain is resolved after extensive revascularization last month.  Overall he is slowly improving.  His reperfusion swelling is improving but still significant.  We discussed elevation and compression as well as increasing his activity.  We will plan to see him back in 3 months with noninvasive studies for follow-up.

## 2019-07-17 ENCOUNTER — Ambulatory Visit (INDEPENDENT_AMBULATORY_CARE_PROVIDER_SITE_OTHER): Payer: Medicare PPO | Admitting: Nurse Practitioner

## 2019-07-17 ENCOUNTER — Encounter (INDEPENDENT_AMBULATORY_CARE_PROVIDER_SITE_OTHER): Payer: Medicare PPO

## 2019-07-17 ENCOUNTER — Encounter (INDEPENDENT_AMBULATORY_CARE_PROVIDER_SITE_OTHER): Payer: Self-pay

## 2019-07-22 ENCOUNTER — Ambulatory Visit: Payer: Medicare PPO | Admitting: Dermatology

## 2019-08-14 DIAGNOSIS — I739 Peripheral vascular disease, unspecified: Secondary | ICD-10-CM | POA: Insufficient documentation

## 2019-08-14 DIAGNOSIS — N1832 Chronic kidney disease, stage 3b: Secondary | ICD-10-CM | POA: Insufficient documentation

## 2019-10-22 ENCOUNTER — Ambulatory Visit (INDEPENDENT_AMBULATORY_CARE_PROVIDER_SITE_OTHER): Payer: Medicare PPO | Admitting: Vascular Surgery

## 2019-10-22 ENCOUNTER — Encounter (INDEPENDENT_AMBULATORY_CARE_PROVIDER_SITE_OTHER): Payer: Medicare PPO

## 2019-10-23 ENCOUNTER — Ambulatory Visit (INDEPENDENT_AMBULATORY_CARE_PROVIDER_SITE_OTHER): Payer: Medicare PPO

## 2019-10-23 ENCOUNTER — Ambulatory Visit (INDEPENDENT_AMBULATORY_CARE_PROVIDER_SITE_OTHER): Payer: Medicare PPO | Admitting: Nurse Practitioner

## 2019-10-23 ENCOUNTER — Encounter (INDEPENDENT_AMBULATORY_CARE_PROVIDER_SITE_OTHER): Payer: Self-pay | Admitting: Nurse Practitioner

## 2019-10-23 ENCOUNTER — Other Ambulatory Visit: Payer: Self-pay

## 2019-10-23 VITALS — BP 137/72 | HR 93 | Resp 16 | Wt 161.2 lb

## 2019-10-23 DIAGNOSIS — I10 Essential (primary) hypertension: Secondary | ICD-10-CM

## 2019-10-23 DIAGNOSIS — I70221 Atherosclerosis of native arteries of extremities with rest pain, right leg: Secondary | ICD-10-CM | POA: Diagnosis not present

## 2019-10-23 DIAGNOSIS — E785 Hyperlipidemia, unspecified: Secondary | ICD-10-CM | POA: Diagnosis not present

## 2019-10-24 ENCOUNTER — Encounter (INDEPENDENT_AMBULATORY_CARE_PROVIDER_SITE_OTHER): Payer: Self-pay | Admitting: Nurse Practitioner

## 2019-10-24 NOTE — Progress Notes (Signed)
Subjective:    Patient ID: [name], male    DOB: May 04, 1930, 86 y.o.   MRN: [id number] Chief Complaint  Patient presents with  . Follow-up    41month ultrasound follow up    The patient returns to the office for followup and review of the noninvasive studies. There have been no interval changes in lower extremity symptoms. No interval shortening of the patient's claudication distance or development of rest pain symptoms. No new ulcers or wounds have occurred since the last visit.  There have been no significant changes to the patient's overall health care.  The patient denies amaurosis fugax or recent TIA symptoms. There are no recent neurological changes noted. The patient denies history of DVT, PE or superficial thrombophlebitis. The patient denies recent episodes of angina or shortness of breath.   ABI Rt=1.04 and Lt=0.61  (previous ABI's Rt=1.06 and Lt=0.51) Duplex ultrasound of the right lower extremity reveals triphasic/biphasic waveforms with good toe waveforms with monophasic waveforms in the left tibial arteries and slightly diminished toe waveforms.   Review of Systems  Cardiovascular: Positive for leg swelling.  All other systems reviewed and are negative.      Objective:   Physical Exam Vitals reviewed.  HENT:     Head: Normocephalic.  Cardiovascular:     Rate and Rhythm: Normal rate and regular rhythm.     Pulses: Normal pulses.  Pulmonary:     Effort: Pulmonary effort is normal.  Musculoskeletal:     Right lower leg: Edema present.  Skin:    General: Skin is warm and dry.  Neurological:     Mental Status: He is alert and oriented to person, place, and time.  Psychiatric:        Mood and Affect: Mood normal.        Behavior: Behavior normal.        Thought Content: Thought content normal.        Judgment: Judgment normal.     BP 137/72 (BP Location: Left Arm)   Pulse 93   Resp 16   Wt 161 lb 3.2 oz (73.1 kg)   BMI 21.86 kg/m   Past  Medical History:  Diagnosis Date  . AAA (abdominal aortic aneurysm) ([place])   . Actinic keratosis   . Arthritis    in fingers and hands  . Cancer ([place])    skin cancer removed several  . Diabetes mellitus without complication ([place])   . Dysrhythmia    atrial fibrillation.  on eliquis  . History of kidney stones   . Hx of basal cell carcinoma 06/30/2016   L proximal mandible  . Hx of basal cell carcinoma 07/19/2016   L medial lower eyelid  . Hx of basal cell carcinoma 09/20/2016   R upper back lateral  . Hx of basal cell carcinoma 12/01/2016   L post shoulder sup medial scapula  . Hx of basal cell carcinoma 12/01/2016   L forehead, 2.0cm above med brow  . Hx of basal cell carcinoma 12/17/2018   L forehead aboove medial brow  . Hx of melanoma in situ 08/16/2016   L proximal mandible/infra auricular  . Hypertension   . Mitral insufficiency    per dr. Clayborn Bigness  . Myocardial infarct ([place]) 1990  . Squamous cell carcinoma of skin 11/01/2017   R superior sideburn in hairline  . Squamous cell carcinoma of skin 01/03/2018   L medial dorsum wrist lateral and medial  . Squamous cell carcinoma of skin 01/03/2018  L proximal thumb  . Squamous cell carcinoma of skin 04/12/2018   L dorsum proximal index finger  . Squamous cell carcinoma of skin 04/12/2018   L dorsum medial proximal thumb  . Squamous cell carcinoma of skin 07/04/2018   R superior sideburn  . Squamous cell carcinoma of skin 07/04/2018   L dorsum proximal index finger  . Squamous cell carcinoma of skin 11/13/2018   R helix  . Transfusion history 1990   received several units of blood after CABG    Social History   Socioeconomic History  . Marital status: Widowed    Spouse name: Not on file  . Number of children: Not on file  . Years of education: Not on file  . Highest education level: Not on file  Occupational History  . Not on file  Tobacco Use  . Smoking status: Former Smoker    Years: 30.00    Types:  Cigarettes    Quit date: 01/25/1988    Years since quitting: 31.7  . Smokeless tobacco: Never Used  Vaping Use  . Vaping Use: Never used  Substance and Sexual Activity  . Alcohol use: No  . Drug use: No  . Sexual activity: Not Currently  Other Topics Concern  . Not on file  Social History Narrative  . Not on file   Social Determinants of Health   Financial Resource Strain:   . Difficulty of Paying Living Expenses: Not on file  Food Insecurity:   . Worried About Charity fundraiser in the Last Year: Not on file  . Ran Out of Food in the Last Year: Not on file  Transportation Needs:   . Lack of Transportation (Medical): Not on file  . Lack of Transportation (Non-Medical): Not on file  Physical Activity:   . Days of Exercise per Week: Not on file  . Minutes of Exercise per Session: Not on file  Stress:   . Feeling of Stress : Not on file  Social Connections:   . Frequency of Communication with Friends and Family: Not on file  . Frequency of Social Gatherings with Friends and Family: Not on file  . Attends Religious Services: Not on file  . Active Member of Clubs or Organizations: Not on file  . Attends Archivist Meetings: Not on file  . Marital Status: Not on file  Intimate Partner Violence:   . Fear of Current or Ex-Partner: Not on file  . Emotionally Abused: Not on file  . Physically Abused: Not on file  . Sexually Abused: Not on file    Past Surgical History:  Procedure Laterality Date  . ABDOMINAL AORTIC ANEURYSM REPAIR  2005, [facility]  . Cardiac bypass  1990   [facility]. triple cabg  . CARDIAC CATHETERIZATION  1990   stents placed but ultimately clotted and shut down.  Marland Kitchen CARDIAC SURGERY  3329   duplicate entry  . CHOLECYSTECTOMY N/A 11/10/2008   Dr Bary Castilla  . CORONARY ARTERY BYPASS GRAFT  1990   x 3. performed after coronary stents failed  . ENDARTERECTOMY FEMORAL Right 05/29/2019   Procedure: ENDARTERECTOMY FEMORAL;  Surgeon: Algernon Huxley, MD;  Location:  [facility] ORS;  Service: Vascular;  Laterality: Right;  . EYE SURGERY Bilateral    cataract extractions with iol  . INGUINAL HERNIA REPAIR Left 08/07/2017   Medium Ultra Pro mesh Surgeon: Robert Bellow, MD;  Location: [facility] ORS;  Service: General;  Laterality: Left;  . INSERTION OF ILIAC STENT Right 05/29/2019  Procedure: INSERTION OF ILIAC STENT ( AND SFA STENT);  Surgeon: Algernon Huxley, MD;  Location: [facility] ORS;  Service: Vascular;  Laterality: Right;  . LOWER EXTREMITY ANGIOGRAPHY Right 05/27/2019   Procedure: LOWER EXTREMITY ANGIOGRAPHY;  Surgeon: Algernon Huxley, MD;  Location: [place] [facility];  Service: Cardiovascular;  Laterality: Right;    Family History  Problem Relation Age of Onset  . Cerebral aneurysm Mother        died at 33  . CAD Father        died at 57    Allergies  Allergen Reactions  . Morphine And Related Nausea And Vomiting  . Sulfa Antibiotics Rash       Assessment & Plan:   1. Atherosclerosis of native artery of right lower extremity with rest pain ([place])  Recommend:  The patient has evidence of atherosclerosis of the lower extremities with claudication.  The patient does not voice lifestyle limiting changes at this point in time.  Noninvasive studies do not suggest clinically significant change.  No invasive studies, angiography or surgery at this time The patient should continue walking and begin a more formal exercise program.  The patient should continue antiplatelet therapy and aggressive treatment of the lipid abnormalities  No changes in the patient's medications at this time  The patient should continue wearing graduated compression socks 10-15 mmHg strength to control the mild edema.    2. Essential hypertension Continue antihypertensive medications as already ordered, these medications have been reviewed and there are no changes at this time.   3. Hyperlipidemia, unspecified hyperlipidemia type Continue statin as ordered and reviewed, no  changes at this time    Current Outpatient Medications on File Prior to Visit  Medication Sig Dispense Refill  . acetaminophen (TYLENOL) 325 MG tablet Take 2 tablets (650 mg total) by mouth every 6 (six) hours as needed for mild pain or fever.    Marland Kitchen atorvastatin (LIPITOR) 40 MG tablet Take 40 mg by mouth daily.    . Cholecalciferol (VITAMIN D3) 5000 units CAPS Take 5,000 Units by mouth daily.    Marland Kitchen diltiazem (CARDIZEM CD) 120 MG 24 hr capsule Take 120 mg by mouth twice daily    . diltiazem (TIAZAC) 180 MG 24 hr capsule Take by mouth.    Arne Cleveland 5 MG TABS tablet Take 1 tablet (5 mg total) by mouth 2 (two) times daily. 1 tablet 0  . glucose blood (BAYER CONTOUR TEST) test strip 1 strip by Does not apply route 3 (three) times daily.    Marland Kitchen lisinopril-hydrochlorothiazide (PRINZIDE,ZESTORETIC) 20-12.5 MG tablet Take 1 tablet by mouth 2 (two) times daily.     . Omega-3 Fatty Acids (FISH OIL) 1000 MG CAPS Take 1,000 mg by mouth daily.     Marland Kitchen tetrahydrozoline (GOODSENSE EYE DROPS) 0.05 % ophthalmic solution Place 1 drop into both eyes daily as needed (dry eyes).    . traMADol (ULTRAM) 50 MG tablet every 6 (six) hours as needed.     . vitamin B-12 (CYANOCOBALAMIN) 1000 MCG tablet Take 1,000 mcg by mouth 2 (two) times a week.    Marland Kitchen aspirin EC 81 MG EC tablet Take 1 tablet (81 mg total) by mouth daily. (Patient not taking: Reported on 07/03/2019) 90 tablet 3   No current facility-administered medications on file prior to visit.    There are no Patient Instructions on file for this visit. No follow-ups on file.   Kris Hartmann, NP

## 2020-03-05 ENCOUNTER — Other Ambulatory Visit: Payer: Self-pay | Admitting: Dermatology

## 2020-03-05 ENCOUNTER — Ambulatory Visit: Payer: Medicare PPO | Admitting: Dermatology

## 2020-03-05 ENCOUNTER — Other Ambulatory Visit: Payer: Self-pay

## 2020-03-05 DIAGNOSIS — C4442 Squamous cell carcinoma of skin of scalp and neck: Secondary | ICD-10-CM

## 2020-03-05 DIAGNOSIS — L570 Actinic keratosis: Secondary | ICD-10-CM | POA: Diagnosis not present

## 2020-03-05 DIAGNOSIS — L578 Other skin changes due to chronic exposure to nonionizing radiation: Secondary | ICD-10-CM

## 2020-03-05 DIAGNOSIS — D485 Neoplasm of uncertain behavior of skin: Secondary | ICD-10-CM

## 2020-03-05 NOTE — Patient Instructions (Signed)

## 2020-03-05 NOTE — Progress Notes (Signed)
Follow-Up Visit   Subjective  [name] is a 87 y.o. male who presents for the following: Other (Scaly spots of face and scalp.). He has had some vascular problems with his leg and has not been seen by dermatologist for a couple years.  He has history of multiple squamous cell carcinomas and basal cell carcinomas and precancerous actinic keratoses treated in the past.  He has a sore growth on his scalp.  The following portions of the chart were reviewed this encounter and updated as appropriate:   Tobacco  Allergies  Meds  Problems  Med Hx  Surg Hx  Fam Hx     Review of Systems:  No other skin or systemic complaints except as noted in HPI or Assessment and Plan.  Objective  Well appearing patient in no apparent distress; mood and affect are within normal limits.  A focused examination was performed including scalp, face. Relevant physical exam findings are noted in the Assessment and Plan.  Objective  Scalp: 1.2 cm hyperkeratotic papule  Objective  Scalp, face, hands (22): Erythematous thin papules/macules with gritty scale.    Assessment & Plan    .Actinic Damage - chronic, secondary to cumulative UV radiation exposure/sun exposure over time - diffuse scaly erythematous macules with underlying dyspigmentation - Recommend daily broad spectrum sunscreen SPF 30+ to sun-exposed areas, reapply every 2 hours as needed.  - Call for new or changing lesions.  Neoplasm of uncertain behavior of skin Scalp  Epidermal / dermal shaving  Lesion diameter (cm):  1.2 Informed consent: discussed and consent obtained   Timeout: patient name, date of birth, surgical site, and procedure verified   Procedure prep:  Patient was prepped and draped in usual sterile fashion Prep type:  Isopropyl alcohol Anesthesia: the lesion was anesthetized in a standard fashion   Anesthetic:  1% lidocaine w/ epinephrine 1-100,000 buffered w/ 8.4% NaHCO3 Instrument used: flexible razor blade    Hemostasis achieved with: pressure, aluminum chloride and electrodesiccation   Outcome: patient tolerated procedure well   Post-procedure details: sterile dressing applied and wound care instructions given   Dressing type: bandage and petrolatum    Destruction of lesion Complexity: extensive   Destruction method: electrodesiccation and curettage   Informed consent: discussed and consent obtained   Timeout:  patient name, date of birth, surgical site, and procedure verified Procedure prep:  Patient was prepped and draped in usual sterile fashion Prep type:  Isopropyl alcohol Anesthesia: the lesion was anesthetized in a standard fashion   Anesthetic:  1% lidocaine w/ epinephrine 1-100,000 buffered w/ 8.4% NaHCO3 Curettage performed in three different directions: Yes   Electrodesiccation performed over the curetted area: Yes   Final wound size (cm):  1.6 Hemostasis achieved with:  pressure and aluminum chloride Outcome: patient tolerated procedure well with no complications   Post-procedure details: sterile dressing applied and wound care instructions given   Dressing type: bandage and petrolatum    Specimen 1 - Surgical pathology Differential Diagnosis: SCC vs other  Check Margins: No 1.2 cm hyperkeratotic papule EDC today  AK (actinic keratosis) (22) Scalp, face, hands  Destruction of lesion - Scalp, face, hands Complexity: simple   Destruction method: cryotherapy   Informed consent: discussed and consent obtained   Timeout:  patient name, date of birth, surgical site, and procedure verified Lesion destroyed using liquid nitrogen: Yes   Region frozen until ice ball extended beyond lesion: Yes   Outcome: patient tolerated procedure well with no complications   Post-procedure  details: wound care instructions given    Return in about 3 months (around 06/02/2020) for AK follow up, Biopsy Follow up.  I, Ashok Cordia, CMA, am acting as scribe for Sarina Ser, MD  .  Documentation: I have reviewed the above documentation for accuracy and completeness, and I agree with the above.  Sarina Ser, MD

## 2020-03-08 ENCOUNTER — Encounter: Payer: Self-pay | Admitting: Dermatology

## 2020-03-11 ENCOUNTER — Telehealth: Payer: Self-pay

## 2020-03-11 NOTE — Telephone Encounter (Signed)
Advised patient of results/hd  

## 2020-03-11 NOTE — Telephone Encounter (Signed)
-----   Message from Ralene Bathe, MD sent at 03/07/2020  4:40 PM EST ----- Diagnosis Skin , scalp SQUAMOUS CELL CARCINOMA, KERATOACANTHOMA TYPE  Cancer - SCC Already treated Recheck next visit

## 2020-04-21 ENCOUNTER — Ambulatory Visit (INDEPENDENT_AMBULATORY_CARE_PROVIDER_SITE_OTHER): Payer: Medicare PPO | Admitting: Vascular Surgery

## 2020-04-21 ENCOUNTER — Encounter (INDEPENDENT_AMBULATORY_CARE_PROVIDER_SITE_OTHER): Payer: Medicare PPO

## 2020-06-17 ENCOUNTER — Other Ambulatory Visit: Payer: Self-pay

## 2020-06-17 ENCOUNTER — Ambulatory Visit: Payer: Medicare PPO | Admitting: Dermatology

## 2020-06-17 DIAGNOSIS — L820 Inflamed seborrheic keratosis: Secondary | ICD-10-CM

## 2020-06-17 DIAGNOSIS — Z85828 Personal history of other malignant neoplasm of skin: Secondary | ICD-10-CM | POA: Diagnosis not present

## 2020-06-17 DIAGNOSIS — L570 Actinic keratosis: Secondary | ICD-10-CM

## 2020-06-17 DIAGNOSIS — L578 Other skin changes due to chronic exposure to nonionizing radiation: Secondary | ICD-10-CM | POA: Diagnosis not present

## 2020-06-17 NOTE — Patient Instructions (Signed)

## 2020-06-17 NOTE — Progress Notes (Signed)
Follow-Up Visit   Subjective  [name] is a 87 y.o. male who presents for the following: Actinic Keratosis (Face, scalp, hands treated with LN2 x 22) and Follow-up (Biopsy proven SCC of scalp treated with [facility] 03/05/2020).  The following portions of the chart were reviewed this encounter and updated as appropriate:   Tobacco  Allergies  Meds  Problems  Med Hx  Surg Hx  Fam Hx     Review of Systems:  No other skin or systemic complaints except as noted in HPI or Assessment and Plan.  Objective  Well appearing patient in no apparent distress; mood and affect are within normal limits.  A focused examination was performed including face, scalp, ears. Relevant physical exam findings are noted in the Assessment and Plan.  Objective  Face/ears (23): Erythematous thin papules/macules with gritty scale.   Objective  Face x 3, arms/hands x 16 (19): Erythematous keratotic or waxy stuck-on papule or plaque.    Assessment & Plan    History of Squamous Cell Carcinoma of the Skin - No evidence of recurrence today - No lymphadenopathy - Recommend regular full body skin exams - Recommend daily broad spectrum sunscreen SPF 30+ to sun-exposed areas, reapply every 2 hours as needed.  - Call if any new or changing lesions are noted between office visits  AK (actinic keratosis) (23) Face/ears  Actinic Damage - Severe, confluent actinic changes with pre-cancerous actinic keratoses  - Severe, chronic, not at goal, secondary to cumulative UV radiation exposure over time - diffuse scaly erythematous macules and papules with underlying dyspigmentation - Discussed Prescription "Field Treatment" for Severe, Chronic Confluent Actinic Changes with Pre-Cancerous Actinic Keratoses Field treatment involves treatment of an entire area of skin that has confluent Actinic Changes (Sun/ Ultraviolet light damage) and PreCancerous Actinic Keratoses by method of PhotoDynamic Therapy (PDT) and/or  prescription Topical Chemotherapy agents such as 5-fluorouracil, 5-fluorouracil/calcipotriene, and/or imiquimod.  The purpose is to decrease the number of clinically evident and subclinical PreCancerous lesions to prevent progression to development of skin cancer by chemically destroying early precancer changes that may or may not be visible.  It has been shown to reduce the risk of developing skin cancer in the treated area. As a result of treatment, redness, scaling, crusting, and open sores may occur during treatment course. One or more than one of these methods may be used and may have to be used several times to control, suppress and eliminate the PreCancerous changes. Discussed treatment course, expected reaction, and possible side effects. - Recommend daily broad spectrum sunscreen SPF 30+ to sun-exposed areas, reapply every 2 hours as needed.  - Staying in the shade or wearing long sleeves, sun glasses (UVA+UVB protection) and wide brim hats (4-inch brim around the entire circumference of the hat) are also recommended. - Call for new or changing lesions.   Patient declines PDT and topical chemotherapy field treatment today.  Advised I recommend field treatment but will evaluate and discuss on follow-up.  Destruction of lesion - Face/ears Complexity: simple   Destruction method: cryotherapy   Informed consent: discussed and consent obtained   Timeout:  patient name, date of birth, surgical site, and procedure verified Lesion destroyed using liquid nitrogen: Yes   Region frozen until ice ball extended beyond lesion: Yes   Outcome: patient tolerated procedure well with no complications   Post-procedure details: wound care instructions given    Inflamed seborrheic keratosis (19) Face x 3, arms/hands x 16  Destruction of lesion - Face  x 3, arms/hands x 16 Complexity: simple   Destruction method: cryotherapy   Informed consent: discussed and consent obtained   Timeout:  patient name, date  of birth, surgical site, and procedure verified Lesion destroyed using liquid nitrogen: Yes   Region frozen until ice ball extended beyond lesion: Yes   Outcome: patient tolerated procedure well with no complications   Post-procedure details: wound care instructions given    Return in about 6 months (around 12/18/2020).  I, Ashok Cordia, CMA, am acting as scribe for Sarina Ser, MD .  Documentation: I have reviewed the above documentation for accuracy and completeness, and I agree with the above.  Sarina Ser, MD

## 2020-06-22 ENCOUNTER — Encounter: Payer: Self-pay | Admitting: Dermatology

## 2020-09-08 ENCOUNTER — Other Ambulatory Visit: Payer: Self-pay

## 2020-09-08 ENCOUNTER — Emergency Department
Admission: EM | Admit: 2020-09-08 | Discharge: 2020-09-08 | Disposition: A | Discharge status: Home / Self Care | Payer: Medicare PPO | Attending: Emergency Medicine | Admitting: Emergency Medicine

## 2020-09-08 ENCOUNTER — Encounter: Payer: Self-pay | Admitting: Emergency Medicine

## 2020-09-08 ENCOUNTER — Emergency Department: Payer: Medicare PPO

## 2020-09-08 DIAGNOSIS — E119 Type 2 diabetes mellitus without complications: Secondary | ICD-10-CM | POA: Diagnosis not present

## 2020-09-08 DIAGNOSIS — I4891 Unspecified atrial fibrillation: Secondary | ICD-10-CM | POA: Insufficient documentation

## 2020-09-08 DIAGNOSIS — I129 Hypertensive chronic kidney disease with stage 1 through stage 4 chronic kidney disease, or unspecified chronic kidney disease: Secondary | ICD-10-CM | POA: Insufficient documentation

## 2020-09-08 DIAGNOSIS — Z955 Presence of coronary angioplasty implant and graft: Secondary | ICD-10-CM | POA: Insufficient documentation

## 2020-09-08 DIAGNOSIS — M25571 Pain in right ankle and joints of right foot: Secondary | ICD-10-CM | POA: Diagnosis present

## 2020-09-08 DIAGNOSIS — Z79899 Other long term (current) drug therapy: Secondary | ICD-10-CM | POA: Insufficient documentation

## 2020-09-08 DIAGNOSIS — Z86006 Personal history of melanoma in-situ: Secondary | ICD-10-CM | POA: Insufficient documentation

## 2020-09-08 DIAGNOSIS — Z85828 Personal history of other malignant neoplasm of skin: Secondary | ICD-10-CM | POA: Diagnosis not present

## 2020-09-08 DIAGNOSIS — Z7901 Long term (current) use of anticoagulants: Secondary | ICD-10-CM | POA: Diagnosis not present

## 2020-09-08 DIAGNOSIS — N1832 Chronic kidney disease, stage 3b: Secondary | ICD-10-CM | POA: Insufficient documentation

## 2020-09-08 DIAGNOSIS — Z87891 Personal history of nicotine dependence: Secondary | ICD-10-CM | POA: Insufficient documentation

## 2020-09-08 DIAGNOSIS — I2510 Atherosclerotic heart disease of native coronary artery without angina pectoris: Secondary | ICD-10-CM | POA: Diagnosis not present

## 2020-09-08 MED ORDER — PREDNISONE 20 MG PO TABS
60.0000 mg | ORAL_TABLET | Freq: Once | ORAL | Status: AC
  Administered 2020-09-08: 60 mg via ORAL
  Filled 2020-09-08: qty 3

## 2020-09-08 MED ORDER — CEPHALEXIN 500 MG PO CAPS
500.0000 mg | ORAL_CAPSULE | Freq: Once | ORAL | Status: AC
  Administered 2020-09-08: 500 mg via ORAL
  Filled 2020-09-08: qty 1

## 2020-09-08 MED ORDER — CEPHALEXIN 500 MG PO CAPS: 500.0000 mg | ORAL_CAPSULE | Freq: Three times a day (TID) | ORAL | 0 refills | Status: AC

## 2020-09-08 MED ORDER — PREDNISONE 10 MG PO TABS: 10.0000 mg | ORAL_TABLET | Freq: Every day | ORAL | 0 refills | Status: DC

## 2020-09-08 NOTE — ED Triage Notes (Signed)
Pt reports swelling, redness and warmth to his right ankle and foot since Friday. Pt reports he tried to think of how could may have injured it but doesn't know. Pt reports he always has swelling since surgery years ago but the swelling is more now.

## 2020-09-08 NOTE — Discharge Instructions (Addendum)
Please rest ice and elevate the right ankle.  Avoid eating red meats.  Take medications as prescribed.  If any increased pain swelling warmth and redness return to the ER

## 2020-09-08 NOTE — ED Provider Notes (Signed)
[facility] EMERGENCY DEPARTMENT Provider Note   CSN: [id number] Arrival date & time: 09/08/20  1753     History Chief Complaint  Patient presents with   Ankle Pain    MONTAGUE TYLUTKI is a 87 y.o. male presents to the emergency department for evaluation of pain swelling warmth in the right foot and ankle x4 days.  No trauma or injury.  He does have a history of gout.  Denies any numbness or tingling.  No swelling throughout the thigh or tib-fib region.  Pain is increased with touch and weightbearing activity.  HPI     Past Medical History:  Diagnosis Date   AAA (abdominal aortic aneurysm) (HCC)    Actinic keratosis    Arthritis    in fingers and hands   Cancer ([place])    skin cancer removed several   Diabetes mellitus without complication ([place])    Dysrhythmia    atrial fibrillation.  on eliquis   History of kidney stones    Hx of basal cell carcinoma 06/30/2016   L proximal mandible   Hx of basal cell carcinoma 07/19/2016   L medial lower eyelid   Hx of basal cell carcinoma 09/20/2016   R upper back lateral   Hx of basal cell carcinoma 12/01/2016   L post shoulder sup medial scapula   Hx of basal cell carcinoma 12/01/2016   L forehead, 2.0cm above med brow   Hx of basal cell carcinoma 12/17/2018   L forehead aboove medial brow   Hx of melanoma in situ 08/16/2016   L proximal mandible/infra auricular   Hypertension    Mitral insufficiency    per dr. Clayborn Bigness   Myocardial infarct [facility]) 1990   Squamous cell carcinoma of skin 11/01/2017   R superior sideburn in hairline   Squamous cell carcinoma of skin 01/03/2018   L medial dorsum wrist lateral and medial   Squamous cell carcinoma of skin 01/03/2018   L proximal thumb   Squamous cell carcinoma of skin 04/12/2018   L dorsum proximal index finger   Squamous cell carcinoma of skin 04/12/2018   L dorsum medial proximal thumb   Squamous cell carcinoma of skin 07/04/2018   R superior sideburn    Squamous cell carcinoma of skin 07/04/2018   L dorsum proximal index finger   Squamous cell carcinoma of skin 11/13/2018   R helix   Squamous cell carcinoma of skin 03/05/2020   scalp   Transfusion history 1990   received several units of blood after CABG    Patient Active Problem List   Diagnosis Date Noted   PVD (peripheral vascular disease) ([place]) 08/14/2019   Stage 3b chronic kidney disease ([place]) 08/14/2019   Atherosclerotic peripheral vascular disease with rest pain ([place]) 05/27/2019   Atherosclerosis of native arteries of extremity with rest pain ([place]) 05/17/2019   Left inguinal hernia 07/18/2017   Atrial fibrillation, new onset ([place]) 05/23/2016   Transient cerebral ischemia 05/23/2016   Coronary artery disease involving native coronary artery of native heart without angina pectoris 03/18/2015   Hyperlipidemia 03/18/2015   PVC (premature ventricular contraction) 03/18/2015   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 triple cabg   CARDIAC CATHETERIZATION  1990   stents placed but ultimately clotted and shut down.   CARDIAC SURGERY  0000000   duplicate entry   CHOLECYSTECTOMY N/A 11/10/2008   Dr Bary Castilla   CORONARY ARTERY BYPASS GRAFT  1990   x 3. performed after coronary stents failed   ENDARTERECTOMY FEMORAL Right 05/29/2019   Procedure: ENDARTERECTOMY FEMORAL;  Surgeon: Algernon Huxley, MD;  Location: [facility] ORS;  Service: Vascular;  Laterality: Right;   EYE SURGERY Bilateral    cataract extractions with iol   INGUINAL HERNIA REPAIR Left 08/07/2017   Medium Ultra Pro mesh Surgeon: Robert Bellow, MD;   Location: [facility] ORS;  Service: General;  Laterality: Left;   INSERTION OF ILIAC STENT Right 05/29/2019   Procedure: INSERTION OF ILIAC STENT ( AND SFA STENT);  Surgeon: Algernon Huxley, MD;  Location: [facility] ORS;  Service: Vascular;  Laterality: Right;   LOWER EXTREMITY ANGIOGRAPHY Right 05/27/2019   Procedure: LOWER EXTREMITY ANGIOGRAPHY;  Surgeon: Algernon Huxley, MD;  Location: [facility];  Service: Cardiovascular;  Laterality: Right;       Family History  Problem Relation Age of Onset   Cerebral aneurysm Mother        died at 50   CAD Father        died at 17    Social History   Tobacco Use   Smoking status: Former    Years: 30.00    Types: Cigarettes    Quit date: 01/25/1988    Years since quitting: 32.6   Smokeless tobacco: Never  Vaping Use   Vaping Use: Never used  Substance Use Topics   Alcohol use: No   Drug use: No    Home Medications Prior to Admission medications   Medication Sig Start Date End Date Taking? Authorizing Provider  cephALEXin (KEFLEX) 500 MG capsule Take 1 capsule (500 mg total) by mouth 3 (three) times daily for 7 days. 09/08/20 09/15/20 Yes Duanne Guess, PA-C  predniSONE (DELTASONE) 10 MG tablet Take 1 tablet (10 mg total) by mouth daily. 6,5,4,3,2,1 six day taper 09/08/20  Yes Duanne Guess, PA-C  acetaminophen (TYLENOL) 325 MG tablet Take 2 tablets (650 mg total) by mouth every 6 (six) hours as needed for mild pain or fever. 05/30/19   Stegmayer, Joelene Millin A, PA-C  atorvastatin (LIPITOR) 40 MG tablet Take 40 mg by mouth daily. 06/26/14   [provider]  Cholecalciferol (VITAMIN D3) 5000 units CAPS Take 5,000 Units by mouth daily.    [provider]  diltiazem (CARDIZEM CD) 180 MG 24 hr capsule Take 180 mg by mouth daily. 08/31/20   [provider]  ELIQUIS 5 MG TABS tablet Take 1 tablet (5 mg total) by mouth 2 (two) times daily. 08/11/17   Robert Bellow, MD  glucose blood (BAYER CONTOUR TEST) test strip 1 strip by Does  not apply route 3 (three) times daily. 03/14/14   [provider]  lisinopril-hydrochlorothiazide (PRINZIDE,ZESTORETIC) 20-12.5 MG tablet Take 1 tablet by mouth 2 (two) times daily.  02/26/15   [provider]  Omega-3 Fatty Acids (FISH OIL) 1000 MG CAPS Take 1,000 mg by mouth daily.     [provider]  tetrahydrozoline (GOODSENSE EYE DROPS) 0.05 % ophthalmic solution Place 1 drop into both eyes daily as needed (dry eyes).    [provider]  vitamin B-12 (CYANOCOBALAMIN) 1000 MCG tablet Take  1,000 mcg by mouth 2 (two) times a week.    [provider]    Allergies    Morphine and related and Sulfa antibiotics  Review of Systems   Review of Systems  Respiratory:  Negative for shortness of breath.   Cardiovascular:  Negative for chest pain and leg swelling.  Gastrointestinal:  Negative for diarrhea, nausea and vomiting.  Musculoskeletal:  Positive for arthralgias, gait problem and joint swelling. Negative for myalgias and neck pain.  Skin:  Negative for pallor, rash and wound.  Neurological:  Negative for numbness.   Physical Exam Updated Vital Signs BP (!) 158/84 (BP Location: Left Arm)   Pulse 86   Temp 98.7 F (37.1 C) (Oral)   Resp 20   Ht 6' (1.829 m)   Wt 74 kg   SpO2 97%   BMI 22.13 kg/m   Physical Exam Constitutional:      Appearance: He is well-developed.  HENT:     Head: Normocephalic and atraumatic.  Eyes:     Conjunctiva/sclera: Conjunctivae normal.  Cardiovascular:     Rate and Rhythm: Normal rate.  Pulmonary:     Effort: Pulmonary effort is normal. No respiratory distress.  Musculoskeletal:     Cervical back: Normal range of motion.     Comments: Right ankle and foot with some swelling warmth and redness along the lateral aspect of the ankle.  Mild swelling.  No swelling or tenderness, warmth or redness throughout the calf region.  No edema throughout the lower leg.  2+ dorsalis pedis pulses present.  Ankle  plantarflexion dorsiflexion is intact but with some pain along the ankle joint.  No wounds or skin breakdown noted.  Skin:    General: Skin is warm.     Findings: No rash.  Neurological:     Mental Status: He is alert and oriented to person, place, and time.  Psychiatric:        Behavior: Behavior normal.        Thought Content: Thought content normal.    ED Results / Procedures / Treatments   Labs (all labs ordered are listed, but only abnormal results are displayed) Labs Reviewed - No data to display  EKG None  Radiology DG Ankle Complete Right  Result Date: 09/08/2020 CLINICAL DATA:  Pain and swelling in the right ankle for several days, initial encounter EXAM: RIGHT ANKLE - COMPLETE 3+ VIEW COMPARISON:  None. FINDINGS: Mild soft tissue swelling is noted about the ankle joint. No acute fracture or dislocation is noted. A few small well corticated bony densities are noted adjacent to the medial and lateral malleoli consistent with prior avulsion is a. No acute abnormality is seen. IMPRESSION: Soft tissue swelling with chronic posttraumatic change. No acute bony abnormality noted. Electronically Signed   By: Inez Catalina M.D.   On: 09/08/2020 19:44   DG Foot Complete Right  Result Date: 09/08/2020 CLINICAL DATA:  Pain and swelling for several days, no known injury, initial encounter EXAM: RIGHT FOOT COMPLETE - 3+ VIEW COMPARISON:  None. FINDINGS: No acute fracture or dislocation is noted. Mild tarsal degenerative changes are seen. Mild soft tissue swelling is noted along the distal aspect of the metatarsals. No other focal abnormality is noted. IMPRESSION: Mild soft tissue swelling without acute bony abnormality. Electronically Signed   By: Inez Catalina M.D.   On: 09/08/2020 19:43    Procedures Procedures   Medications Ordered in ED Medications  cephALEXin (KEFLEX) capsule 500 mg (has no administration in time  range)  predniSONE (DELTASONE) tablet 60 mg (60 mg Oral Given 09/08/20  2025)    ED Course  I have reviewed the triage vital signs and the nursing notes.  Pertinent labs & imaging results that were available during my care of the patient were reviewed by me and considered in my medical decision making (see chart for details).    MDM Rules/Calculators/A&P                         87 year old male with right ankle pain, swelling.  There is warmth and redness.  He has a history of gout.  Concerned about possible gout placed on prednisone taper.  X-rays negative.  We will also place on cephalexin to cover for cellulitis.  Return to the ER for any worsening symptoms or to changes in health. Final Clinical Impression(s) / ED Diagnoses Final diagnoses:  Acute right ankle pain    Rx / DC Orders ED Discharge Orders          Ordered    predniSONE (DELTASONE) 10 MG tablet  Daily        09/08/20 2025    cephALEXin (KEFLEX) 500 MG capsule  3 times daily        09/08/20 2025             Renata Caprice 09/08/20 2028    Arta Silence, MD 09/09/20 1035

## 2020-11-06 ENCOUNTER — Other Ambulatory Visit: Payer: Self-pay

## 2020-11-06 ENCOUNTER — Emergency Department: Payer: Medicare PPO

## 2020-11-06 ENCOUNTER — Encounter: Payer: Self-pay | Admitting: Emergency Medicine

## 2020-11-06 DIAGNOSIS — W010XXA Fall on same level from slipping, tripping and stumbling without subsequent striking against object, initial encounter: Secondary | ICD-10-CM | POA: Insufficient documentation

## 2020-11-06 DIAGNOSIS — I129 Hypertensive chronic kidney disease with stage 1 through stage 4 chronic kidney disease, or unspecified chronic kidney disease: Secondary | ICD-10-CM | POA: Diagnosis not present

## 2020-11-06 DIAGNOSIS — N1832 Chronic kidney disease, stage 3b: Secondary | ICD-10-CM | POA: Diagnosis not present

## 2020-11-06 DIAGNOSIS — Z85828 Personal history of other malignant neoplasm of skin: Secondary | ICD-10-CM | POA: Diagnosis not present

## 2020-11-06 DIAGNOSIS — Z87891 Personal history of nicotine dependence: Secondary | ICD-10-CM | POA: Diagnosis not present

## 2020-11-06 DIAGNOSIS — Z79899 Other long term (current) drug therapy: Secondary | ICD-10-CM | POA: Insufficient documentation

## 2020-11-06 DIAGNOSIS — E119 Type 2 diabetes mellitus without complications: Secondary | ICD-10-CM | POA: Insufficient documentation

## 2020-11-06 DIAGNOSIS — Z7901 Long term (current) use of anticoagulants: Secondary | ICD-10-CM | POA: Diagnosis not present

## 2020-11-06 DIAGNOSIS — S51012A Laceration without foreign body of left elbow, initial encounter: Secondary | ICD-10-CM | POA: Diagnosis not present

## 2020-11-06 DIAGNOSIS — Z23 Encounter for immunization: Secondary | ICD-10-CM | POA: Insufficient documentation

## 2020-11-06 DIAGNOSIS — S0101XA Laceration without foreign body of scalp, initial encounter: Secondary | ICD-10-CM | POA: Insufficient documentation

## 2020-11-06 DIAGNOSIS — S199XXA Unspecified injury of neck, initial encounter: Secondary | ICD-10-CM | POA: Diagnosis not present

## 2020-11-06 LAB — CBC
HCT: 41 % (ref 39.0–52.0)
Hemoglobin: 13.3 g/dL (ref 13.0–17.0)
MCH: 30.2 pg (ref 26.0–34.0)
MCHC: 32.4 g/dL (ref 30.0–36.0)
MCV: 93.2 fL (ref 80.0–100.0)
Platelets: 220 10*3/uL (ref 150–400)
RBC: 4.4 MIL/uL (ref 4.22–5.81)
RDW: 14.6 % (ref 11.5–15.5)
WBC: 6.9 10*3/uL (ref 4.0–10.5)
nRBC: 0 % (ref 0.0–0.2)

## 2020-11-06 LAB — BASIC METABOLIC PANEL
Anion gap: 10 (ref 5–15)
BUN: 36 mg/dL — ABNORMAL HIGH (ref 8–23)
CO2: 26 mmol/L (ref 22–32)
Calcium: 10.3 mg/dL (ref 8.9–10.3)
Chloride: 103 mmol/L (ref 98–111)
Creatinine, Ser: 1.47 mg/dL — ABNORMAL HIGH (ref 0.61–1.24)
GFR, Estimated: 46 mL/min — ABNORMAL LOW (ref >60)
Glucose, Bld: 161 mg/dL — ABNORMAL HIGH (ref 70–99)
Potassium: 3.5 mmol/L (ref 3.5–5.1)
Sodium: 139 mmol/L (ref 135–145)

## 2020-11-06 LAB — CBG MONITORING, ED: Glucose-Capillary: 144 mg/dL — ABNORMAL HIGH (ref 70–99)

## 2020-11-06 NOTE — ED Triage Notes (Signed)
Pt in via POV, reports getting up to go to bed, thinks he may have passed out, woke up with blood every where.  Approximate 5" laceration noted to top of head, bleeding controlled at this time.  Does report being on Eliquis.  Hypertensive upon arrival, denies any complaints at this time.

## 2020-11-07 ENCOUNTER — Emergency Department
Admission: EM | Admit: 2020-11-07 | Discharge: 2020-11-07 | Disposition: A | Discharge status: Home / Self Care | Payer: Medicare PPO | Attending: Emergency Medicine | Admitting: Emergency Medicine

## 2020-11-07 DIAGNOSIS — W19XXXA Unspecified fall, initial encounter: Secondary | ICD-10-CM

## 2020-11-07 DIAGNOSIS — S0101XA Laceration without foreign body of scalp, initial encounter: Secondary | ICD-10-CM

## 2020-11-07 DIAGNOSIS — S0990XA Unspecified injury of head, initial encounter: Secondary | ICD-10-CM

## 2020-11-07 DIAGNOSIS — S51012A Laceration without foreign body of left elbow, initial encounter: Secondary | ICD-10-CM

## 2020-11-07 LAB — URINALYSIS, ROUTINE W REFLEX MICROSCOPIC
Bilirubin Urine: NEGATIVE
Glucose, UA: NEGATIVE mg/dL
Hgb urine dipstick: NEGATIVE
Ketones, ur: NEGATIVE mg/dL
Leukocytes,Ua: NEGATIVE
Nitrite: NEGATIVE
Protein, ur: NEGATIVE mg/dL
Specific Gravity, Urine: 1.014 (ref 1.005–1.030)
pH: 5 (ref 5.0–8.0)

## 2020-11-07 LAB — TROPONIN I (HIGH SENSITIVITY)
Troponin I (High Sensitivity): 21 ng/L — ABNORMAL HIGH (ref <18)
Troponin I (High Sensitivity): 17 ng/L (ref <18)

## 2020-11-07 MED ORDER — BACITRACIN ZINC 500 UNIT/GM EX OINT
TOPICAL_OINTMENT | Freq: Once | CUTANEOUS | Status: AC
  Filled 2020-11-07: qty 0.9

## 2020-11-07 MED ORDER — LIDOCAINE-EPINEPHRINE 2 %-1:100000 IJ SOLN
20.0000 mL | Freq: Once | INTRAMUSCULAR | Status: AC
  Administered 2020-11-07: 20 mL via INTRADERMAL
  Filled 2020-11-07: qty 1

## 2020-11-07 MED ORDER — TETANUS-DIPHTH-ACELL PERTUSSIS 5-2.5-18.5 LF-MCG/0.5 IM SUSY
0.5000 mL | PREFILLED_SYRINGE | Freq: Once | INTRAMUSCULAR | Status: AC
  Administered 2020-11-07: 0.5 mL via INTRAMUSCULAR
  Filled 2020-11-07: qty 0.5

## 2020-11-07 NOTE — Discharge Instructions (Addendum)
You may take Tylenol 1000 mg every 6 hours as needed for pain.  You have 8 staples in your scalp that will need to be removed in 7 to 10 days.  This can be done by your primary care doctor, urgent care or you may return to the ED.  You may clean your wound gently with warm soap and water and apply over-the-counter Neosporin daily.  Your labs, urine, EKG, CT head and cervical spine showed no new acute abnormality.  I do recommend close follow-up with your primary care doctor given we are not sure why you fell today.

## 2020-11-07 NOTE — ED Provider Notes (Addendum)
[facility] Emergency Department Provider Note ____________________________________________   Event Date/Time   First MD Initiated Contact with Patient 11/07/20 [id number]     (approximate)  I have reviewed the triage vital signs and the nursing notes.   HISTORY  Chief Complaint Fall and Head Injury    HPI [name] is a 87 y.o. male with history of atrial fibrillation on Eliquis, chronic kidney disease, CAD status post CABG, hypertension, diabetes, AAA status postsurgical repair who presents to the emergency department after he fell last night.  States that he remembers going to the thermostat to adjust it and then the next thing he remembers he was on the ground.  When asked if he had a syncopal event he states "I must have".  He denies any preceding symptoms that led to this fall including chest pain, shortness of breath, palpitations or dizziness.  Wife states he has complained of dizziness while being in the waiting room.  Has had mild headache and neck pain.  Has a large laceration to the left upper and posterior scalp.  Unsure of his last tetanus vaccination.  Denies numbness, tingling or focal weakness.  Denies recent fevers, cough, vomiting or diarrhea.  Prior to the fall he was in his normal state of health.  States he was able to work in his yard today and did not have any symptoms.         Past Medical History:  Diagnosis Date   AAA (abdominal aortic aneurysm)    Actinic keratosis    Arthritis    in fingers and hands   Cancer ([place])    skin cancer removed several   Diabetes mellitus without complication ([place])    Dysrhythmia    atrial fibrillation.  on eliquis   History of kidney stones    Hx of basal cell carcinoma 06/30/2016   L proximal mandible   Hx of basal cell carcinoma 07/19/2016   L medial lower eyelid   Hx of basal cell carcinoma 09/20/2016   R upper back lateral   Hx of basal cell carcinoma 12/01/2016   L post shoulder sup  medial scapula   Hx of basal cell carcinoma 12/01/2016   L forehead, 2.0cm above med brow   Hx of basal cell carcinoma 12/17/2018   L forehead aboove medial brow   Hx of melanoma in situ 08/16/2016   L proximal mandible/infra auricular   Hypertension    Mitral insufficiency    per dr. Clayborn Bigness   Myocardial infarct [facility]) 1990   Squamous cell carcinoma of skin 11/01/2017   R superior sideburn in hairline   Squamous cell carcinoma of skin 01/03/2018   L medial dorsum wrist lateral and medial   Squamous cell carcinoma of skin 01/03/2018   L proximal thumb   Squamous cell carcinoma of skin 04/12/2018   L dorsum proximal index finger   Squamous cell carcinoma of skin 04/12/2018   L dorsum medial proximal thumb   Squamous cell carcinoma of skin 07/04/2018   R superior sideburn   Squamous cell carcinoma of skin 07/04/2018   L dorsum proximal index finger   Squamous cell carcinoma of skin 11/13/2018   R helix   Squamous cell carcinoma of skin 03/05/2020   scalp   Transfusion history 1990   received several units of blood after CABG    Patient Active Problem List   Diagnosis Date Noted   PVD (peripheral vascular disease) ([place]) 08/14/2019   Stage 3b chronic kidney  disease ([place]) 08/14/2019   Atherosclerotic peripheral vascular disease with rest pain ([place]) 05/27/2019   Atherosclerosis of native arteries of extremity with rest pain ([place]) 05/17/2019   Left inguinal hernia 07/18/2017   Atrial fibrillation, new onset ([place]) 05/23/2016   Transient cerebral ischemia 05/23/2016   Coronary artery disease involving native coronary artery of native heart without angina pectoris 03/18/2015   Hyperlipidemia 03/18/2015   PVC (premature ventricular contraction) 03/18/2015   S/P CABG x 3 03/18/2015   AAA (abdominal aortic aneurysm)    Controlled type 2 diabetes mellitus without complication ([place]) 84/16/6063   Essential hypertension 11/28/2013   Impaired renal function 11/28/2013   Ischemic heart  disease 11/28/2013   Spinal stenosis 11/28/2013    Past Surgical History:  Procedure Laterality Date   ABDOMINAL AORTIC ANEURYSM REPAIR  2005, [facility]   Cardiac bypass  1990   [facility]. triple cabg   CARDIAC CATHETERIZATION  1990   stents placed but ultimately clotted and shut down.   CARDIAC SURGERY  0160   duplicate entry   CHOLECYSTECTOMY N/A 11/10/2008   Dr Bary Castilla   CORONARY ARTERY BYPASS GRAFT  1990   x 3. performed after coronary stents failed   ENDARTERECTOMY FEMORAL Right 05/29/2019   Procedure: ENDARTERECTOMY FEMORAL;  Surgeon: Algernon Huxley, MD;  Location: [facility] ORS;  Service: Vascular;  Laterality: Right;   EYE SURGERY Bilateral    cataract extractions with iol   INGUINAL HERNIA REPAIR Left 08/07/2017   Medium Ultra Pro mesh Surgeon: Robert Bellow, MD;  Location: [facility] ORS;  Service: General;  Laterality: Left;   INSERTION OF ILIAC STENT Right 05/29/2019   Procedure: INSERTION OF ILIAC STENT ( AND SFA STENT);  Surgeon: Algernon Huxley, MD;  Location: [facility] ORS;  Service: Vascular;  Laterality: Right;   LOWER EXTREMITY ANGIOGRAPHY Right 05/27/2019   Procedure: LOWER EXTREMITY ANGIOGRAPHY;  Surgeon: Algernon Huxley, MD;  Location: [facility];  Service: Cardiovascular;  Laterality: Right;    Prior to Admission medications   Medication Sig Start Date End Date Taking? Authorizing Provider  acetaminophen (TYLENOL) 325 MG tablet Take 2 tablets (650 mg total) by mouth every 6 (six) hours as needed for mild pain or fever. 05/30/19   Stegmayer, Joelene Millin A, PA-C  atorvastatin (LIPITOR) 40 MG tablet Take 40 mg by mouth daily. 06/26/14   [provider]  Cholecalciferol (VITAMIN D3) 5000 units CAPS Take 5,000 Units by mouth daily.    [provider]  diltiazem (CARDIZEM CD) 180 MG 24 hr capsule Take 180 mg by mouth daily. 08/31/20   [provider]  ELIQUIS 5 MG TABS tablet Take 1 tablet (5 mg total) by mouth 2 (two) times daily. 08/11/17   Robert Bellow, MD   glucose blood (BAYER CONTOUR TEST) test strip 1 strip by Does not apply route 3 (three) times daily. 03/14/14   [provider]  lisinopril-hydrochlorothiazide (PRINZIDE,ZESTORETIC) 20-12.5 MG tablet Take 1 tablet by mouth 2 (two) times daily.  02/26/15   [provider]  Omega-3 Fatty Acids (FISH OIL) 1000 MG CAPS Take 1,000 mg by mouth daily.     [provider]  predniSONE (DELTASONE) 10 MG tablet Take 1 tablet (10 mg total) by mouth daily. 6,5,4,3,2,1 six day taper 09/08/20   Duanne Guess, PA-C  tetrahydrozoline (GOODSENSE EYE DROPS) 0.05 % ophthalmic solution Place 1 drop into both eyes daily as needed (dry eyes).    [provider]  vitamin B-12 (CYANOCOBALAMIN) 1000 MCG tablet Take  1,000 mcg by mouth 2 (two) times a week.    [provider]    Allergies Morphine and related and Sulfa antibiotics  Family History  Problem Relation Age of Onset   Cerebral aneurysm Mother        died at 53   CAD Father        died at 76    Social History Social History   Tobacco Use   Smoking status: Former    Years: 30.00    Types: Cigarettes    Quit date: 01/25/1988    Years since quitting: 32.8   Smokeless tobacco: Never  Vaping Use   Vaping Use: Never used  Substance Use Topics   Alcohol use: No   Drug use: No    Review of Systems Constitutional: No fever. Eyes: No visual changes. ENT: No sore throat. Cardiovascular: Denies chest pain. Respiratory: Denies shortness of breath. Gastrointestinal: No nausea, vomiting, diarrhea. Genitourinary: Negative for dysuria. Musculoskeletal: Negative for back pain. Skin: Negative for rash. Neurological: Negative for focal weakness or numbness.   ____________________________________________   PHYSICAL EXAM:  VITAL SIGNS: ED Triage Vitals  Enc Vitals Group     BP 11/06/20 2037 (!) 165/95     Pulse Rate 11/06/20 2037 84     Resp 11/06/20 2037 15     Temp 11/06/20 2037 97.9 F (36.6 C)      Temp Source 11/06/20 2037 Oral     SpO2 11/06/20 2037 99 %     Weight 11/06/20 2037 158 lb (71.7 kg)     Height 11/06/20 2037 6' (1.829 m)     Head Circumference --      Peak Flow --      Pain Score 11/06/20 2050 3     Pain Loc --      Pain Edu? --      Excl. in [place]? --    CONSTITUTIONAL: Alert and oriented and responds appropriately to questions. Well-appearing; well-nourished; GCS 15, elderly, no distress HEAD: Normocephalic; 8 cm laceration to the left posterior scalp that is hemostatic EYES: Conjunctivae clear, PERRL, EOMI ENT: normal nose; no rhinorrhea; moist mucous membranes; pharynx without lesions noted; no dental injury; no septal hematoma NECK: Supple, no meningismus, no LAD; no midline spinal tenderness, step-off or deformity; trachea midline CARD: RRR; S1 and S2 appreciated; no murmurs, no clicks, no rubs, no gallops RESP: Normal chest excursion without splinting or tachypnea; breath sounds clear and equal bilaterally; no wheezes, no rhonchi, no rales; no hypoxia or respiratory distress CHEST:  chest wall stable, no crepitus or ecchymosis or deformity, nontender to palpation; no flail chest ABD/GI: Normal bowel sounds; non-distended; soft, non-tender, no rebound, no guarding; no ecchymosis or other lesions noted PELVIS:  stable, nontender to palpation BACK:  The back appears normal and is non-tender to palpation, there is no CVA tenderness; no midline spinal tenderness, step-off or deformity EXT: Normal ROM in all joints; non-tender to palpation; no edema; normal capillary refill; no cyanosis, no bony tenderness or bony deformity of patient's extremities, no joint effusion, compartments are soft, extremities are warm and well-perfused, no ecchymosis; superficial skin tear to the left elbow without bony tenderness or bony deformity SKIN: Normal color for age and race; warm NEURO: Moves all extremities equally, normal speech, no facial asymmetry, normal gait PSYCH: The patient's  mood and manner are appropriate. Grooming and personal hygiene are appropriate.  ____________________________________________   LABS (all labs ordered are listed, but only abnormal results are displayed)  Labs  Reviewed  BASIC METABOLIC PANEL - Abnormal; Notable for the following components:      Result Value   Glucose, Bld 161 (*)    BUN 36 (*)    Creatinine, Ser 1.47 (*)    GFR, Estimated 46 (*)    All other components within normal limits  URINALYSIS, ROUTINE W REFLEX MICROSCOPIC - Abnormal; Notable for the following components:   Color, Urine YELLOW (*)    APPearance CLEAR (*)    All other components within normal limits  CBG MONITORING, ED - Abnormal; Notable for the following components:   Glucose-Capillary 144 (*)    All other components within normal limits  TROPONIN I (HIGH SENSITIVITY) - Abnormal; Notable for the following components:   Troponin I (High Sensitivity) 21 (*)    All other components within normal limits  CBC  TROPONIN I (HIGH SENSITIVITY)   ____________________________________________  EKG   EKG Interpretation  Date/Time:  Friday November 06 2020 20:55:22 EDT Ventricular Rate:  76 PR Interval:    QRS Duration: 114 QT Interval:  398 QTC Calculation: 447 R Axis:   -52 Text Interpretation: Atrial fibrillation Left axis deviation Septal infarct , age undetermined Possible Lateral infarct , age undetermined Abnormal ECG no change compared to previous tracings Confirmed by Pryor Curia [phone]) on 11/07/2020 5:22:31 AM        ____________________________________________  RADIOLOGY Jessie Foot Deshia Vanderhoof, personally viewed and evaluated these images (plain radiographs) as part of my medical decision making, as well as reviewing the written report by the radiologist.  ED MD interpretation: CT head and cervical spine show no intracranial abnormality or cervical fracture, dislocation.  Official radiology report(s): CT HEAD WO CONTRAST  Result Date:  11/06/2020 CLINICAL DATA:  Syncope, fall, head laceration, on Eliquis EXAM: CT HEAD WITHOUT CONTRAST CT CERVICAL SPINE WITHOUT CONTRAST TECHNIQUE: Multidetector CT imaging of the head and cervical spine was performed following the standard protocol without intravenous contrast. Multiplanar CT image reconstructions of the cervical spine were also generated. COMPARISON:  CT head dated 12/20/2011 FINDINGS: CT HEAD FINDINGS Brain: No evidence of acute infarction, hemorrhage, hydrocephalus, extra-axial collection or mass lesion/mass effect. Mild cortical atrophy. Subcortical white matter and periventricular small vessel ischemic changes. Vascular: Intracranial atherosclerosis. Skull: Normal. Negative for fracture or focal lesion. Sinuses/Orbits: The visualized paranasal sinuses are essentially clear. The mastoid air cells are unopacified. Other: Soft tissue laceration along the left vertex (coronal image 52). CT CERVICAL SPINE FINDINGS Alignment: Normal cervical lordosis. Skull base and vertebrae: No acute fracture. No primary bone lesion or focal pathologic process. Soft tissues and spinal canal: No prevertebral fluid or swelling. No visible canal hematoma. Disc levels: Moderate degenerative changes of the mid cervical spine. Spinal canal is patent. Upper chest: Visualized lung apices are clear. 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 In the setting of significant comorbidities or limited life expectancy, no follow-up recommended (ref: J Am Coll Radiol. 2015 Feb;12(2): 143-50). IMPRESSION: Soft tissue laceration along the left vertex. No evidence of calvarial fracture. No evidence of acute intracranial abnormality. Mild cortical atrophy with small vessel ischemic changes. No evidence of traumatic injury to the cervical spine. Moderate multilevel degenerative changes. Electronically Signed   By: Julian Hy M.D.   On: 11/06/2020 22:11   CT CERVICAL SPINE WO CONTRAST  Result  Date: 11/06/2020 CLINICAL DATA:  Syncope, fall, head laceration, on Eliquis EXAM: CT HEAD WITHOUT CONTRAST CT CERVICAL SPINE WITHOUT CONTRAST TECHNIQUE: Multidetector CT imaging of the head and cervical spine  was performed following the standard protocol without intravenous contrast. Multiplanar CT image reconstructions of the cervical spine were also generated. COMPARISON:  CT head dated 12/20/2011 FINDINGS: CT HEAD FINDINGS Brain: No evidence of acute infarction, hemorrhage, hydrocephalus, extra-axial collection or mass lesion/mass effect. Mild cortical atrophy. Subcortical white matter and periventricular small vessel ischemic changes. Vascular: Intracranial atherosclerosis. Skull: Normal. Negative for fracture or focal lesion. Sinuses/Orbits: The visualized paranasal sinuses are essentially clear. The mastoid air cells are unopacified. Other: Soft tissue laceration along the left vertex (coronal image 52). CT CERVICAL SPINE FINDINGS Alignment: Normal cervical lordosis. Skull base and vertebrae: No acute fracture. No primary bone lesion or focal pathologic process. Soft tissues and spinal canal: No prevertebral fluid or swelling. No visible canal hematoma. Disc levels: Moderate degenerative changes of the mid cervical spine. Spinal canal is patent. Upper chest: Visualized lung apices are clear. 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 In the setting of significant comorbidities or limited life expectancy, no follow-up recommended (ref: J Am Coll Radiol. 2015 Feb;12(2): 143-50). IMPRESSION: Soft tissue laceration along the left vertex. No evidence of calvarial fracture. No evidence of acute intracranial abnormality. Mild cortical atrophy with small vessel ischemic changes. No evidence of traumatic injury to the cervical spine. Moderate multilevel degenerative changes. Electronically Signed   By: Julian Hy M.D.   On: 11/06/2020 22:11     ____________________________________________   PROCEDURES  Procedure(s) performed (including Critical Care):  .1-3 Lead EKG Interpretation Performed by: Anella Nakata, Delice Bison, DO Authorized by: Nakyla Bracco, Delice Bison, DO     Interpretation: normal     ECG rate:  88   ECG rate assessment: normal     Rhythm: atrial fibrillation     Ectopy: PVCs     Conduction: normal    LACERATION REPAIR Performed by: Pryor Curia Authorized by: Pryor Curia Consent: Verbal consent obtained. Risks and benefits: risks, benefits and alternatives were discussed Consent given by: patient Patient identity confirmed: provided demographic data Prepped and Draped in normal sterile fashion Wound explored  Laceration Location: posterior left scalp  Laceration Length: 8 cm  No Foreign Bodies seen or palpated  Anesthesia: local infiltration  Local anesthetic: lidocaine 2% with epinephrine  Anesthetic total: 7 ml  Irrigation method: syringe Amount of cleaning: standard  Skin closure: staples  Number of sutures: 8 staples  Technique: Area anesthetized using lidocaine 2% with epinephrine. Wound irrigated copiously with sterile saline. Wound then cleaned with Betadine and draped in sterile fashion. Wound closed using 8 staples.  Bacitracin and sterile dressing applied. Good wound approximation and hemostasis achieved.   Patient tolerance: Patient tolerated the procedure well with no immediate complications.     ____________________________________________   INITIAL IMPRESSION / ASSESSMENT AND PLAN / ED COURSE  As part of my medical decision making, I reviewed the following data within the [place] History obtained from family, Nursing notes reviewed and incorporated, Labs reviewed , EKG interpreted , Old EKG reviewed, Old chart reviewed, , and Notes from prior ED visits         Patient here after a fall with head injury and large laceration while on Eliquis.  States he thinks  he might of had a syncopal event that caused him to fall but no other preceding symptoms.  Differential includes but not limited to ACS, cardiac arrhythmia, dehydration, anemia, electrolyte derangement, UTI.  EKG shows atrial fibrillation that is rate controlled with no new ischemic abnormality.  Labs here are reassuring.  Normal glucose, hemoglobin, electrolytes.  Troponin x1 normal.  Second pending.  We will place him on a cardiac monitor.  We will check orthostatic vital signs.  Will check urine to evaluate for dehydration, UTI.  He has no complaints of chest or abdominal pain.  Doubt ruptured aneurysm, dissection, PE.  ED PROGRESS  After talking to patient further, he states that he thinks he turned around too quickly after he adjusted his thermostat and that is what caused him to fall.  He does state that in the day prior he was having some hip pain and thinks that the hip could have given out on him.  He denies any hip pain currently.  No events seen on cardiac monitoring.  Second troponin negative.  Urine shows no sign of infection or dehydration.  Orthostatic vital signs negative.  Have offered admission for observation given he is not sure if he had a syncopal event or not but he adamantly declined stating that he wants to go home.  No events seen on cardiac monitoring other than occasional PVCs.  He is in atrial fibrillation which is chronic for him and this is rate controlled.  Remains hemodynamically stable without complaints.  Will discharge.  Discussed wound care instructions and head injury return precautions.  Recommended close follow-up with his PCP.   At this time, I do not feel there is any life-threatening condition present. I have reviewed, interpreted and discussed all results (EKG, imaging, lab, urine as appropriate) and exam findings with patient/family. I have reviewed nursing notes and appropriate previous records.  I feel the patient is safe to be discharged home without  further emergent workup and can continue workup as an outpatient as needed. Discussed usual and customary return precautions. Patient/family verbalize understanding and are comfortable with this plan.  Outpatient follow-up has been provided as needed. All questions have been answered.  ____________________________________________   FINAL CLINICAL IMPRESSION(S) / ED DIAGNOSES  Final diagnoses:  Fall in home, initial encounter  Injury of head, initial encounter  Laceration of scalp, initial encounter     ED Discharge Orders     None       *Please note:  MERTON WADLOW was evaluated in Emergency Department on 11/07/2020 for the symptoms described in the history of present illness. He was evaluated in the context of the global COVID-19 pandemic, which necessitated consideration that the patient might be at risk for infection with the SARS-CoV-2 virus that causes COVID-19. Institutional protocols and algorithms that pertain to the evaluation of patients at risk for COVID-19 are in a state of rapid change based on information released by regulatory bodies including the CDC and federal and state organizations. These policies and algorithms were followed during the patient's care in the ED.  Some ED evaluations and interventions may be delayed as a result of limited staffing during and the pandemic.*   Note:  This document was prepared using Dragon voice recognition software and may include unintentional dictation errors.    Michall Noffke, Delice Bison, DO 11/07/20 0622    Colleen Donahoe, Delice Bison, DO 11/07/20 [phone]

## 2020-12-24 ENCOUNTER — Other Ambulatory Visit: Payer: Self-pay

## 2020-12-24 ENCOUNTER — Ambulatory Visit: Payer: Medicare PPO | Admitting: Dermatology

## 2020-12-24 DIAGNOSIS — L820 Inflamed seborrheic keratosis: Secondary | ICD-10-CM

## 2020-12-24 DIAGNOSIS — L821 Other seborrheic keratosis: Secondary | ICD-10-CM

## 2020-12-24 DIAGNOSIS — L570 Actinic keratosis: Secondary | ICD-10-CM | POA: Diagnosis not present

## 2020-12-24 DIAGNOSIS — D692 Other nonthrombocytopenic purpura: Secondary | ICD-10-CM | POA: Diagnosis not present

## 2020-12-24 DIAGNOSIS — L578 Other skin changes due to chronic exposure to nonionizing radiation: Secondary | ICD-10-CM

## 2020-12-24 NOTE — Progress Notes (Signed)
Follow-Up Visit   Subjective  [name] is a 87 y.o. male who presents for the following: Actinic Keratosis (Face, scalp, ears, 1m f/u). The patient has spots, moles and lesions to be evaluated, some may be new or changing and the patient has concerns that these could be cancer.  The following portions of the chart were reviewed this encounter and updated as appropriate:   Tobacco  Allergies  Meds  Problems  Med Hx  Surg Hx  Fam Hx     Review of Systems:  No other skin or systemic complaints except as noted in HPI or Assessment and Plan.  Objective  Well appearing patient in no apparent distress; mood and affect are within normal limits.  A focused examination was performed including face, scalp, ears. Relevant physical exam findings are noted in the Assessment and Plan.  face, ears x 20 (20) Pink scaly macules   face x 4, Total = 4 Erythematous keratotic or waxy stuck-on papule or plaque.   L forearm x 1 Hyperkeratotic pap   Assessment & Plan   Seborrheic Keratoses - Stuck-on, waxy, tan-brown papules and/or plaques  - Benign-appearing - Discussed benign etiology and prognosis. - Observe - Call for any changes  Purpura - Chronic; persistent and recurrent.  Treatable, but not curable. - Violaceous macules and patches - Benign - Related to trauma, age, sun damage and/or use of blood thinners, chronic use of topical and/or oral steroids - Observe - Can use OTC arnica containing moisturizer such as Dermend Bruise Formula if desired - Call for worsening or other concerns   Actinic Damage - chronic, secondary to cumulative UV radiation exposure/sun exposure over time - diffuse scaly erythematous macules with underlying dyspigmentation - Recommend daily broad spectrum sunscreen SPF 30+ to sun-exposed areas, reapply every 2 hours as needed.  - Recommend staying in the shade or wearing long sleeves, sun glasses (UVA+UVB protection) and wide brim hats (4-inch  brim around the entire circumference of the hat). - Call for new or changing lesions.   AK (actinic keratosis) (20) face, ears x 20  May consider topical field treatment on f/u  Destruction of lesion - face, ears x 20 Complexity: simple   Destruction method: cryotherapy   Informed consent: discussed and consent obtained   Timeout:  patient name, date of birth, surgical site, and procedure verified Lesion destroyed using liquid nitrogen: Yes   Region frozen until ice ball extended beyond lesion: Yes   Outcome: patient tolerated procedure well with no complications   Post-procedure details: wound care instructions given    Inflamed seborrheic keratosis face x 4, Total = 4  Destruction of lesion - face x 4, Total = 4 Complexity: simple   Destruction method: cryotherapy   Informed consent: discussed and consent obtained   Timeout:  patient name, date of birth, surgical site, and procedure verified Lesion destroyed using liquid nitrogen: Yes   Region frozen until ice ball extended beyond lesion: Yes   Outcome: patient tolerated procedure well with no complications   Post-procedure details: wound care instructions given    Hypertrophic actinic keratosis L forearm x 1  Destruction of lesion - L forearm x 1 Complexity: simple   Destruction method: cryotherapy   Informed consent: discussed and consent obtained   Timeout:  patient name, date of birth, surgical site, and procedure verified Lesion destroyed using liquid nitrogen: Yes   Region frozen until ice ball extended beyond lesion: Yes   Outcome: patient tolerated procedure well with  no complications   Post-procedure details: wound care instructions given    Return in about 4 months (around 04/24/2021) for AK f/u.  I, [name], RMA, am acting as scribe for Sarina Ser, MD . Documentation: I have reviewed the above documentation for accuracy and completeness, and I agree with the above.  Sarina Ser, MD

## 2020-12-24 NOTE — Patient Instructions (Addendum)
If You Need Anything After Your Visit  If you have any questions or concerns for your doctor, please call our main line at [phone] and press option 4 to reach your doctor's medical assistant. If no one answers, please leave a voicemail as directed and we will return your call as soon as possible. Messages left after 4 pm will be answered the following business day.   You may also send us a message via MyChart. We typically respond to MyChart messages within 1-2 business days.  For prescription refills, please ask your pharmacy to contact our office. Our fax number is [phone].  If you have an urgent issue when the clinic is closed that cannot wait until the next business day, you can page your doctor at the number below.    Please note that while we do our best to be available for urgent issues outside of office hours, we are not available 24/7.   If you have an urgent issue and are unable to reach us, you may choose to seek medical care at your doctor's office, retail clinic, urgent care center, or emergency room.  If you have a medical emergency, please immediately call 911 or go to the emergency department.  Pager Numbers  - Dr. Kowalski: [phone]  - Dr. Moye: [phone]  - Dr. Stewart: [phone]  In the event of inclement weather, please call our main line at [phone] for an update on the status of any delays or closures.  Dermatology Medication Tips: Please keep the boxes that topical medications come in in order to help keep track of the instructions about where and how to use these. Pharmacies typically print the medication instructions only on the boxes and not directly on the medication tubes.   If your medication is too expensive, please contact our office at [phone] option 4 or send us a message through MyChart.   We are unable to tell what your co-pay for medications will be in advance as this is different depending on your insurance coverage.  However, we may be able to find a substitute medication at lower cost or fill out paperwork to get insurance to cover a needed medication.   If a prior authorization is required to get your medication covered by your insurance company, please allow us 1-2 business days to complete this process.  Drug prices often vary depending on where the prescription is filled and some pharmacies may offer cheaper prices.  The website www.goodrx.com contains coupons for medications through different pharmacies. The prices here do not account for what the cost may be with help from insurance (it may be cheaper with your insurance), but the website can give you the price if you did not use any insurance.  - You can print the associated coupon and take it with your prescription to the pharmacy.  - You may also stop by our office during regular business hours and pick up a GoodRx coupon card.  - If you need your prescription sent electronically to a different pharmacy, notify our office through [facility] MyChart or by phone at [phone] option 4.     Si Usted Necesita Algo Despus de Su Visita  Tambin puede enviarnos un mensaje a travs de MyChart. Por lo general respondemos a los mensajes de MyChart en el transcurso de 1 a 2 das hbiles.  Para renovar recetas, por favor pida a su farmacia que se ponga en contacto con nuestra oficina. Nuestro nmero de fax es el [phone].  Si tiene   un asunto urgente cuando la clnica est cerrada y que no puede esperar hasta el siguiente da hbil, puede llamar/localizar a su doctor(a) al nmero que aparece a continuacin.   Por favor, tenga en cuenta que aunque hacemos todo lo posible para estar disponibles para asuntos urgentes fuera del horario de oficina, no estamos disponibles las 24 horas del da, los 7 das de la semana.   Si tiene un problema urgente y no puede comunicarse con nosotros, puede optar por buscar atencin mdica  en el consultorio de su  doctor(a), en una clnica privada, en un centro de atencin urgente o en una sala de emergencias.  Si tiene una emergencia mdica, por favor llame inmediatamente al 911 o vaya a la sala de emergencias.  Nmeros de bper  - Dr. Kowalski: [phone]  - Dra. Moye: [phone]  - Dra. Stewart: [phone]  En caso de inclemencias del tiempo, por favor llame a nuestra lnea principal al [phone] para una actualizacin sobre el estado de cualquier retraso o cierre.  Consejos para la medicacin en dermatologa: Por favor, guarde las cajas en las que vienen los medicamentos de uso tpico para ayudarle a seguir las instrucciones sobre dnde y cmo usarlos. Las farmacias generalmente imprimen las instrucciones del medicamento slo en las cajas y no directamente en los tubos del medicamento.   Si su medicamento es muy caro, por favor, pngase en contacto con nuestra oficina llamando al [phone] y presione la opcin 4 o envenos un mensaje a travs de MyChart.   No podemos decirle cul ser su copago por los medicamentos por adelantado ya que esto es diferente dependiendo de la cobertura de su seguro. Sin embargo, es posible que podamos encontrar un medicamento sustituto a menor costo o llenar un formulario para que el seguro cubra el medicamento que se considera necesario.   Si se requiere una autorizacin previa para que su compaa de seguros cubra su medicamento, por favor permtanos de 1 a 2 das hbiles para completar este proceso.  Los precios de los medicamentos varan con frecuencia dependiendo del lugar de dnde se surte la receta y alguna farmacias pueden ofrecer precios ms baratos.  El sitio web www.goodrx.com tiene cupones para medicamentos de diferentes farmacias. Los precios aqu no tienen en cuenta lo que podra costar con la ayuda del seguro (puede ser ms barato con su seguro), pero el sitio web puede darle el precio si no utiliz ningn seguro.  - Puede imprimir el cupn  correspondiente y llevarlo con su receta a la farmacia.  - Tambin puede pasar por nuestra oficina durante el horario de atencin regular y recoger una tarjeta de cupones de GoodRx.  - Si necesita que su receta se enve electrnicamente a una farmacia diferente, informe a nuestra oficina a travs de MyChart de [facility] o por telfono llamando al [phone] y presione la opcin 4.   Cryotherapy Aftercare  Wash gently with soap and water everyday.   Apply Vaseline and Band-Aid daily until healed.  

## 2021-01-05 ENCOUNTER — Encounter: Payer: Self-pay | Admitting: Dermatology

## 2021-04-27 ENCOUNTER — Encounter: Payer: Self-pay | Admitting: Dermatology

## 2021-04-27 ENCOUNTER — Ambulatory Visit: Payer: Medicare PPO | Admitting: Dermatology

## 2021-04-27 DIAGNOSIS — L570 Actinic keratosis: Secondary | ICD-10-CM | POA: Diagnosis not present

## 2021-04-27 DIAGNOSIS — L820 Inflamed seborrheic keratosis: Secondary | ICD-10-CM | POA: Diagnosis not present

## 2021-04-27 DIAGNOSIS — L578 Other skin changes due to chronic exposure to nonionizing radiation: Secondary | ICD-10-CM

## 2021-04-27 DIAGNOSIS — L821 Other seborrheic keratosis: Secondary | ICD-10-CM

## 2021-04-27 DIAGNOSIS — L814 Other melanin hyperpigmentation: Secondary | ICD-10-CM | POA: Diagnosis not present

## 2021-04-27 NOTE — Patient Instructions (Signed)
Cryotherapy Aftercare ? ?Wash gently with soap and water everyday.   ?Apply Vaseline and Band-Aid daily until healed.  ? ?Prior to procedure, discussed risks of blister formation, small wound, skin dyspigmentation, or rare scar following cryotherapy. Recommend Vaseline ointment to treated areas while healing.  ? ? ?If You Need Anything After Your Visit ? ?If you have any questions or concerns for your doctor, please call our main line at [phone] and press option 4 to reach your doctor's medical assistant. If no one answers, please leave a voicemail as directed and we will return your call as soon as possible. Messages left after 4 pm will be answered the following business day.  ? ?You may also send [place] a message via MyChart. We typically respond to MyChart messages within 1-2 business days. ? ?For prescription refills, please ask your pharmacy to contact our office. Our fax number is [phone]. ? ?If you have an urgent issue when the clinic is closed that cannot wait until the next business day, you can page your doctor at the number below.   ? ?Please note that while we do our best to be available for urgent issues outside of office hours, we are not available 24/7.  ? ?If you have an urgent issue and are unable to reach [place], you may choose to seek medical care at your doctor's office, retail clinic, urgent care center, or emergency room. ? ?If you have a medical emergency, please immediately call 911 or go to the emergency department. ? ?Pager Numbers ? ?- Dr. Nehemiah Massed: [phone] ? ?- Dr. Laurence Ferrari: [phone] ? ?- Dr. Nicole Kindred: [phone] ? ?In the event of inclement weather, please call our main line at [phone] for an update on the status of any delays or closures. ? ?Dermatology Medication Tips: ?Please keep the boxes that topical medications come in in order to help keep track of the instructions about where and how to use these. Pharmacies typically print the medication instructions only on the  boxes and not directly on the medication tubes.  ? ?If your medication is too expensive, please contact our office at [phone] option 4 or send [place] a message through [place].  ? ?We are unable to tell what your co-pay for medications will be in advance as this is different depending on your insurance coverage. However, we may be able to find a substitute medication at lower cost or fill out paperwork to get insurance to cover a needed medication.  ? ?If a prior authorization is required to get your medication covered by your insurance company, please allow [place] 1-2 business days to complete this process. ? ?Drug prices often vary depending on where the prescription is filled and some pharmacies may offer cheaper prices. ? ?The website www.goodrx.com contains coupons for medications through different pharmacies. The prices here do not account for what the cost may be with help from insurance (it may be cheaper with your insurance), but the website can give you the price if you did not use any insurance.  ?- You can print the associated coupon and take it with your prescription to the pharmacy.  ?- You may also stop by our office during regular business hours and pick up a GoodRx coupon card.  ?- If you need your prescription sent electronically to a different pharmacy, notify our office through [facility] or by phone at [phone] option 4. ? ? ? ? ?Si Usted Necesita Algo Despu?s de Su Visita ? ?Tambi?n puede enviarnos un mensaje a  trav?s de MyChart. Por lo general respondemos a los mensajes de MyChart en el transcurso de 1 a 2 d?as h?biles. ? ?Para renovar recetas, por favor pida a su farmacia que se ponga en contacto con nuestra oficina. Nuestro n?mero de fax es el [phone]. ? ?Si tiene un asunto urgente cuando la cl?nica est? cerrada y que no puede esperar hasta el siguiente d?a h?bil, puede llamar/localizar a su doctor(a) al n?mero que aparece a continuaci?n.  ? ?Por favor, tenga en cuenta que  aunque hacemos todo lo posible para estar disponibles para asuntos urgentes fuera del horario de oficina, no estamos disponibles las 24 horas del d?a, los 7 d?as de la semana.  ? ?Si tiene un problema urgente y no puede comunicarse con nosotros, puede optar por buscar atenci?n m?dica  en el consultorio de su doctor(a), en una cl?nica privada, en un centro de atenci?n urgente o en una sala de emergencias. ? ?Si tiene Engineer, maintenance (IT) m?dica, por favor llame inmediatamente al 911 o vaya a la sala de emergencias. ? ?N?meros de b?per ? ?- Dr. Nehemiah Massed: [phone] ? ?- Dra. Moye: [phone] ? ?- Dra. Nicole Kindred: [phone] ? ?En caso de inclemencias del tiempo, por favor llame a nuestra l?nea principal al [phone] para una actualizaci?n sobre el estado de cualquier retraso o cierre. ? ?Consejos para la medicaci?n en dermatolog?a: ?Por favor, guarde las cajas en las que vienen los medicamentos de uso t?pico para ayudarle a seguir las instrucciones sobre d?nde y c?mo usarlos. Las farmacias generalmente imprimen las instrucciones del medicamento s?lo en las cajas y no directamente en los tubos del [place].  ? ?Si su medicamento es muy caro, por favor, p?ngase en contacto con Zigmund Daniel llamando al [phone] y presione la opci?n 4 o env?enos un mensaje a trav?s de MyChart.  ? ?No podemos decirle cu?l ser? su copago por los medicamentos por adelantado ya que esto es diferente dependiendo de la cobertura de su seguro. Sin embargo, es posible que podamos encontrar un medicamento sustituto a Electrical engineer un formulario para que el seguro cubra el medicamento que se considera necesario.  ? ?Si se requiere Ardelia Mems autorizaci?n previa para que su compa??a de seguros [place] su medicamento, por favor perm?tanos de 1 a 2 d?as h?biles para completar este proceso. ? ?Los precios de los medicamentos var?an con frecuencia dependiendo del Environmental consultant de d?nde se surte la receta y alguna farmacias pueden ofrecer precios m?s  baratos. ? ?El sitio web www.goodrx.com tiene cupones para medicamentos de Airline pilot. Los precios aqu? no tienen en cuenta lo que podr?a costar con la ayuda del seguro (puede ser m?s barato con su seguro), pero el sitio web puede darle el precio si no utiliz? ning?n seguro.  ?- Puede imprimir el cup?n correspondiente y llevarlo con su receta a la farmacia.  ?- Tambi?n puede pasar por nuestra oficina durante el horario de atenci?n regular y recoger una tarjeta de cupones de GoodRx.  ?- Si necesita que su receta se env?e electr?nicamente a Chiropodist, informe a nuestra oficina a trav?s de MyChart de [facility] o por tel?fono llamando al [phone] y presione la opci?n 4.  ?

## 2021-04-27 NOTE — Progress Notes (Signed)
? ?Follow-Up Visit ?  ?Subjective  ?[name] is a 88 y.o. male who presents for the following: Actinic Keratosis (4 month recheck. Face. Ears. Treated with LN2 at last visit). ?The patient has spots, moles and lesions to be evaluated, some may be new or changing and the patient has concerns that these could be cancer. ? ?The following portions of the chart were reviewed this encounter and updated as appropriate:  Tobacco  Allergies  Meds  Problems  Med Hx  Surg Hx  Fam Hx   ?  ?Review of Systems: No other skin or systemic complaints except as noted in HPI or Assessment and Plan. ? ?Objective  ?Well appearing patient in no apparent distress; mood and affect are within normal limits. ? ?A focused examination was performed including head, including the scalp, face, neck, nose, ears, eyelids, and lips. Relevant physical exam findings are noted in the Assessment and Plan. ? ?Left Cheek x1 ?Erythematous keratotic or waxy stuck-on papule or plaque. ? ?face and ears x25, hands x7 (32) ?Erythematous thin papules/macules with gritty scale.  ? ? ?Assessment & Plan  ? ?Actinic Damage ?- chronic, secondary to cumulative UV radiation exposure/sun exposure over time ?- diffuse scaly erythematous macules with underlying dyspigmentation ?- Recommend daily broad spectrum sunscreen SPF 30+ to sun-exposed areas, reapply every 2 hours as needed.  ?- Recommend staying in the shade or wearing long sleeves, sun glasses (UVA+UVB protection) and wide brim hats (4-inch brim around the entire circumference of the hat). ?- Call for new or changing lesions. ? ?Seborrheic Keratoses ?- Stuck-on, waxy, tan-brown papules and/or plaques  ?- Benign-appearing ?- Discussed benign etiology and prognosis. ?- Observe ?- Call for any changes ? ?Lentigines ?- Scattered tan macules ?- Due to sun exposure ?- Benign-appering, observe ?- Recommend daily broad spectrum sunscreen SPF 30+ to sun-exposed areas, reapply every 2 hours as needed. ?- Call  for any changes ? ?Inflamed seborrheic keratosis ?Left Cheek x1 ? ?Destruction of lesion - Left Cheek x1 ?Complexity: simple   ?Destruction method: cryotherapy   ?Informed consent: discussed and consent obtained   ?Timeout:  patient name, date of birth, surgical site, and procedure verified ?Lesion destroyed using liquid nitrogen: Yes   ?Region frozen until ice ball extended beyond lesion: Yes   ?Outcome: patient tolerated procedure well with no complications   ?Post-procedure details: wound care instructions given   ? ?AK (actinic keratosis) (32) ?face and ears x25, hands x7 ? ?Actinic keratoses are precancerous spots that appear secondary to cumulative UV radiation exposure/sun exposure over time. They are chronic with expected duration over 1 year. A portion of actinic keratoses will progress to squamous cell carcinoma of the skin. It is not possible to reliably predict which spots will progress to skin cancer and so treatment is recommended to prevent development of skin cancer. ? ?Recommend daily broad spectrum sunscreen SPF 30+ to sun-exposed areas, reapply every 2 hours as needed.  ?Recommend staying in the shade or wearing long sleeves, sun glasses (UVA+UVB protection) and wide brim hats (4-inch brim around the entire circumference of the hat). ?Call for new or changing lesions. ? ?Destruction of lesion - face and ears x25, hands x7 ?Complexity: simple   ?Destruction method: cryotherapy   ?Informed consent: discussed and consent obtained   ?Timeout:  patient name, date of birth, surgical site, and procedure verified ?Lesion destroyed using liquid nitrogen: Yes   ?Region frozen until ice ball extended beyond lesion: Yes   ?Outcome: patient tolerated procedure well  with no complications   ?Post-procedure details: wound care instructions given   ? ? ?Return in about 4 months (around 08/27/2021) for AK Follow Up. ? ?I, Emelia Salisbury, CMA, am acting as scribe for Sarina Ser, MD. ?Documentation: I have reviewed  the above documentation for accuracy and completeness, and I agree with the above. ? ?Sarina Ser, MD ? ? ?

## 2021-09-01 ENCOUNTER — Ambulatory Visit: Payer: Medicare PPO | Admitting: Dermatology

## 2021-09-01 ENCOUNTER — Encounter: Payer: Self-pay | Admitting: Dermatology

## 2021-09-01 DIAGNOSIS — L578 Other skin changes due to chronic exposure to nonionizing radiation: Secondary | ICD-10-CM | POA: Diagnosis not present

## 2021-09-01 DIAGNOSIS — L570 Actinic keratosis: Secondary | ICD-10-CM

## 2021-09-01 DIAGNOSIS — C441122 Basal cell carcinoma of skin of right lower eyelid, including canthus: Secondary | ICD-10-CM | POA: Diagnosis not present

## 2021-09-01 DIAGNOSIS — C441121 Basal cell carcinoma of skin of right upper eyelid, including canthus: Secondary | ICD-10-CM

## 2021-09-01 DIAGNOSIS — Z85828 Personal history of other malignant neoplasm of skin: Secondary | ICD-10-CM | POA: Diagnosis not present

## 2021-09-01 DIAGNOSIS — D485 Neoplasm of uncertain behavior of skin: Secondary | ICD-10-CM

## 2021-09-01 DIAGNOSIS — C4491 Basal cell carcinoma of skin, unspecified: Secondary | ICD-10-CM

## 2021-09-01 HISTORY — DX: Basal cell carcinoma of skin, unspecified: C44.91

## 2021-09-01 NOTE — Progress Notes (Signed)
Follow-Up Visit   Subjective  [name] is a 88 y.o. male who presents for the following: Actinic Keratosis (4 month follow up - face, ears, hands treated with LN2) and Other (Spot - right medial canthus - came up and grew fast). The patient has spots, moles and lesions to be evaluated, some may be new or changing and the patient has concerns that these could be cancer.  The following portions of the chart were reviewed this encounter and updated as appropriate:   Tobacco  Allergies  Meds  Problems  Med Hx  Surg Hx  Fam Hx     Review of Systems:  No other skin or systemic complaints except as noted in HPI or Assessment and Plan.  Objective  Well appearing patient in no apparent distress; mood and affect are within normal limits.  A focused examination was performed including face. Relevant physical exam findings are noted in the Assessment and Plan.  Right Medial Canthus/Lower Eyelid 1.0 cm pearly papule     Face (16) Hyperkeratotic pink papules   Assessment & Plan   Actinic Damage - chronic, secondary to cumulative UV radiation exposure/sun exposure over time - diffuse scaly erythematous macules with underlying dyspigmentation - Recommend daily broad spectrum sunscreen SPF 30+ to sun-exposed areas, reapply every 2 hours as needed.  - Recommend staying in the shade or wearing long sleeves, sun glasses (UVA+UVB protection) and wide brim hats (4-inch brim around the entire circumference of the hat). - Call for new or changing lesions.  History of Basal Cell Carcinoma of the Skin - No evidence of recurrence today - Recommend regular full body skin exams - Recommend daily broad spectrum sunscreen SPF 30+ to sun-exposed areas, reapply every 2 hours as needed.  - Call if any new or changing lesions are noted between office visits  History of Squamous Cell Carcinoma of the Skin - No evidence of recurrence today - No lymphadenopathy - Recommend regular full body  skin exams - Recommend daily broad spectrum sunscreen SPF 30+ to sun-exposed areas, reapply every 2 hours as needed.  - Call if any new or changing lesions are noted between office visits  Neoplasm of uncertain behavior of skin Right Medial Canthus/Lower Eyelid  Skin excision  Lesion length (cm):  1 Lesion width (cm):  1 Margin per side (cm):  0.2 Total excision diameter (cm):  1.4 Informed consent: discussed and consent obtained   Timeout: patient name, date of birth, surgical site, and procedure verified   Procedure prep:  Patient was prepped and draped in usual sterile fashion Prep type:  Isopropyl alcohol and povidone-iodine Anesthesia: the lesion was anesthetized in a standard fashion   Anesthetic:  1% lidocaine w/ epinephrine 1-100,000 buffered w/ 8.4% NaHCO3 Instrument used: #15 blade   Hemostasis achieved with: pressure   Hemostasis achieved with comment:  Electrocautery Outcome: patient tolerated procedure well with no complications   Post-procedure details: sterile dressing applied and wound care instructions given   Dressing type: bandage and pressure dressing (mupirocin)    Destruction of lesion Complexity: extensive   Destruction method: electrodesiccation and curettage   Informed consent: discussed and consent obtained   Timeout:  patient name, date of birth, surgical site, and procedure verified Procedure prep:  Patient was prepped and draped in usual sterile fashion Prep type:  Isopropyl alcohol Anesthesia: the lesion was anesthetized in a standard fashion   Anesthetic:  1% lidocaine w/ epinephrine 1-100,000 buffered w/ 8.4% NaHCO3 Curettage performed in three different directions:  Yes   Electrodesiccation performed over the curetted area: Yes   Hemostasis achieved with:  pressure and aluminum chloride Outcome: patient tolerated procedure well with no complications   Post-procedure details: sterile dressing applied and wound care instructions given   Dressing  type: bandage and petrolatum    Specimen 1 - Surgical pathology Differential Diagnosis: BCC vs other  Check Margins: No  Area treated today due to rapid growth. Advised patient we will refer him to Mohs with any recurrence.   AK (actinic keratosis) (16) Face  Hypertrophic AK  Destruction of lesion - Face Complexity: simple   Destruction method: cryotherapy   Informed consent: discussed and consent obtained   Timeout:  patient name, date of birth, surgical site, and procedure verified Lesion destroyed using liquid nitrogen: Yes   Region frozen until ice ball extended beyond lesion: Yes   Outcome: patient tolerated procedure well with no complications   Post-procedure details: wound care instructions given    May plan topical field treatment with 5FU/calcipotriene on fu.  Return in about 3 months (around 12/02/2021).  I, Ashok Cordia, CMA, am acting as scribe for Sarina Ser, MD . Documentation: I have reviewed the above documentation for accuracy and completeness, and I agree with the above.  Sarina Ser, MD

## 2021-09-01 NOTE — Patient Instructions (Signed)
Cryotherapy Aftercare  Wash gently with soap and water everyday.   Apply Vaseline and Band-Aid daily until healed.  Wound Care Instructions  Cleanse wound gently with soap and water once a day then pat dry with clean gauze. Apply a thin coat of Petrolatum (petroleum jelly, "Vaseline") over the wound (unless you have an allergy to this). We recommend that you use a new, sterile tube of Vaseline. Do not pick or remove scabs. Do not remove the yellow or white "healing tissue" from the base of the wound.  Cover the wound with fresh, clean, nonstick gauze and secure with paper tape. You may use Band-Aids in place of gauze and tape if the wound is small enough, but would recommend trimming much of the tape off as there is often too much. Sometimes Band-Aids can irritate the skin.  You should call the office for your biopsy report after 1 week if you have not already been contacted.  If you experience any problems, such as abnormal amounts of bleeding, swelling, significant bruising, significant pain, or evidence of infection, please call the office immediately.  FOR ADULT SURGERY PATIENTS: If you need something for pain relief you may take 1 extra strength Tylenol (acetaminophen) AND 2 Ibuprofen (200mg each) together every 4 hours as needed for pain. (do not take these if you are allergic to them or if you have a reason you should not take them.) Typically, you may only need pain medication for 1 to 3 days.      Due to recent changes in healthcare laws, you may see results of your pathology and/or laboratory studies on MyChart before the doctors have had a chance to review them. We understand that in some cases there may be results that are confusing or concerning to you. Please understand that not all results are received at the same time and often the doctors may need to interpret multiple results in order to provide you with the best plan of care or course of treatment. Therefore, we ask that you  please give us 2 business days to thoroughly review all your results before contacting the office for clarification. Should we see a critical lab result, you will be contacted sooner.   If You Need Anything After Your Visit  If you have any questions or concerns for your doctor, please call our main line at [phone] and press option 4 to reach your doctor's medical assistant. If no one answers, please leave a voicemail as directed and we will return your call as soon as possible. Messages left after 4 pm will be answered the following business day.   You may also send us a message via MyChart. We typically respond to MyChart messages within 1-2 business days.  For prescription refills, please ask your pharmacy to contact our office. Our fax number is [phone].  If you have an urgent issue when the clinic is closed that cannot wait until the next business day, you can page your doctor at the number below.    Please note that while we do our best to be available for urgent issues outside of office hours, we are not available 24/7.   If you have an urgent issue and are unable to reach us, you may choose to seek medical care at your doctor's office, retail clinic, urgent care center, or emergency room.  If you have a medical emergency, please immediately call 911 or go to the emergency department.  Pager Numbers  - Dr. Kowalski: [phone]  -   Dr. Moye: [phone]  - Dr. Stewart: [phone]  In the event of inclement weather, please call our main line at [phone] for an update on the status of any delays or closures.  Dermatology Medication Tips: Please keep the boxes that topical medications come in in order to help keep track of the instructions about where and how to use these. Pharmacies typically print the medication instructions only on the boxes and not directly on the medication tubes.   If your medication is too expensive, please contact our office at  [phone] option 4 or send us a message through MyChart.   We are unable to tell what your co-pay for medications will be in advance as this is different depending on your insurance coverage. However, we may be able to find a substitute medication at lower cost or fill out paperwork to get insurance to cover a needed medication.   If a prior authorization is required to get your medication covered by your insurance company, please allow us 1-2 business days to complete this process.  Drug prices often vary depending on where the prescription is filled and some pharmacies may offer cheaper prices.  The website www.goodrx.com contains coupons for medications through different pharmacies. The prices here do not account for what the cost may be with help from insurance (it may be cheaper with your insurance), but the website can give you the price if you did not use any insurance.  - You can print the associated coupon and take it with your prescription to the pharmacy.  - You may also stop by our office during regular business hours and pick up a GoodRx coupon card.  - If you need your prescription sent electronically to a different pharmacy, notify our office through [facility] MyChart or by phone at [phone] option 4.     Si Usted Necesita Algo Despus de Su Visita  Tambin puede enviarnos un mensaje a travs de MyChart. Por lo general respondemos a los mensajes de MyChart en el transcurso de 1 a 2 das hbiles.  Para renovar recetas, por favor pida a su farmacia que se ponga en contacto con nuestra oficina. Nuestro nmero de fax es el [phone].  Si tiene un asunto urgente cuando la clnica est cerrada y que no puede esperar hasta el siguiente da hbil, puede llamar/localizar a su doctor(a) al nmero que aparece a continuacin.   Por favor, tenga en cuenta que aunque hacemos todo lo posible para estar disponibles para asuntos urgentes fuera del horario de oficina, no estamos  disponibles las 24 horas del da, los 7 das de la semana.   Si tiene un problema urgente y no puede comunicarse con nosotros, puede optar por buscar atencin mdica  en el consultorio de su doctor(a), en una clnica privada, en un centro de atencin urgente o en una sala de emergencias.  Si tiene una emergencia mdica, por favor llame inmediatamente al 911 o vaya a la sala de emergencias.  Nmeros de bper  - Dr. Kowalski: [phone]  - Dra. Moye: [phone]  - Dra. Stewart: [phone]  En caso de inclemencias del tiempo, por favor llame a nuestra lnea principal al [phone] para una actualizacin sobre el estado de cualquier retraso o cierre.  Consejos para la medicacin en dermatologa: Por favor, guarde las cajas en las que vienen los medicamentos de uso tpico para ayudarle a seguir las instrucciones sobre dnde y cmo usarlos. Las farmacias generalmente imprimen las instrucciones del medicamento slo en las cajas y   no directamente en los tubos del medicamento.   Si su medicamento es muy caro, por favor, pngase en contacto con nuestra oficina llamando al [phone] y presione la opcin 4 o envenos un mensaje a travs de MyChart.   No podemos decirle cul ser su copago por los medicamentos por adelantado ya que esto es diferente dependiendo de la cobertura de su seguro. Sin embargo, es posible que podamos encontrar un medicamento sustituto a menor costo o llenar un formulario para que el seguro cubra el medicamento que se considera necesario.   Si se requiere una autorizacin previa para que su compaa de seguros cubra su medicamento, por favor permtanos de 1 a 2 das hbiles para completar este proceso.  Los precios de los medicamentos varan con frecuencia dependiendo del lugar de dnde se surte la receta y alguna farmacias pueden ofrecer precios ms baratos.  El sitio web www.goodrx.com tiene cupones para medicamentos de diferentes farmacias. Los precios aqu no  tienen en cuenta lo que podra costar con la ayuda del seguro (puede ser ms barato con su seguro), pero el sitio web puede darle el precio si no utiliz ningn seguro.  - Puede imprimir el cupn correspondiente y llevarlo con su receta a la farmacia.  - Tambin puede pasar por nuestra oficina durante el horario de atencin regular y recoger una tarjeta de cupones de GoodRx.  - Si necesita que su receta se enve electrnicamente a una farmacia diferente, informe a nuestra oficina a travs de MyChart de [place] o por telfono llamando al [phone] y presione la opcin 4.  

## 2021-09-06 ENCOUNTER — Telehealth: Payer: Self-pay

## 2021-09-06 NOTE — Telephone Encounter (Signed)
-----   Message from Ralene Bathe, MD sent at 09/06/2021 11:13 AM EDT ----- Diagnosis Skin , right medial canthus/lower eyelid, excision of lesion BASAL CELL CARCINOMA, NODULAR PATTERN  Cancer - BCC Already treated/excised Recheck next visit

## 2021-09-06 NOTE — Telephone Encounter (Signed)
Called patient. Discussed pathology results. Recheck at next visit. Patient voiced understanding. JP

## 2021-12-02 ENCOUNTER — Ambulatory Visit: Payer: Medicare PPO | Admitting: Dermatology

## 2021-12-02 DIAGNOSIS — L578 Other skin changes due to chronic exposure to nonionizing radiation: Secondary | ICD-10-CM

## 2021-12-02 DIAGNOSIS — L570 Actinic keratosis: Secondary | ICD-10-CM | POA: Diagnosis not present

## 2021-12-02 DIAGNOSIS — Z85828 Personal history of other malignant neoplasm of skin: Secondary | ICD-10-CM | POA: Diagnosis not present

## 2021-12-02 DIAGNOSIS — Z5111 Encounter for antineoplastic chemotherapy: Secondary | ICD-10-CM

## 2021-12-02 DIAGNOSIS — Z79899 Other long term (current) drug therapy: Secondary | ICD-10-CM | POA: Diagnosis not present

## 2021-12-02 MED ORDER — FLUOROURACIL 5 % EX CREA: TOPICAL_CREAM | Freq: Two times a day (BID) | CUTANEOUS | 3 refills | Status: DC

## 2021-12-02 NOTE — Patient Instructions (Addendum)
- On December 17, 2021, Start 5-fluorouracil/calcipotriene cream twice a day for 7 days to affected areas including forehead up into hairline and temples. Prescription sent to [facility]. Patient advised they will receive an email to purchase the medication online and have it sent to their home. Patient provided with handout reviewing treatment course and side effects and advised to call or message [place] on MyChart with any concerns.  Reviewed course of treatment and expected reaction.  Patient advised to expect inflammation and crusting and advised that erosions are possible.  Patient advised to be diligent with sun protection during and after treatment. Counseled to keep medication out of reach of children and pets.   Instructions for [facility]  One or more of your medications was sent to the [facility] order [facility]. You will receive an email from them and can purchase the medicine through that link. It will then be mailed to your home at the address you confirmed. If for any reason you do not receive an email from them, please check your spam folder. If you still do not find the email, please let us know. [facility] phone number is [phone].   5-Fluorouracil/Calcipotriene Patient Education   Actinic keratoses are the dry, red scaly spots on the skin caused by sun damage. A portion of these spots can turn into skin cancer with time, and treating them can help prevent development of skin cancer.   Treatment of these spots requires removal of the defective skin cells. There are various ways to remove actinic keratoses, including freezing with liquid nitrogen, treatment with creams, or treatment with a blue light procedure in the office.   5-fluorouracil cream is a topical cream used to treat actinic keratoses. It works by interfering with the growth of abnormal fast-growing skin cells, such as actinic keratoses. These cells peel  off and are replaced by healthy ones.   5-fluorouracil/calcipotriene is a combination of the 5-fluorouracil cream with a vitamin D analog cream called calcipotriene. The calcipotriene alone does not treat actinic keratoses. However, when it is combined with 5-fluorouracil, it helps the 5-fluorouracil treat the actinic keratoses much faster so that the same results can be achieved with a much shorter treatment time.  INSTRUCTIONS FOR 5-FLUOROURACIL/CALCIPOTRIENE CREAM:   5-fluorouracil/calcipotriene cream typically only needs to be used for 4-7 days. A thin layer should be applied twice a day to the treatment areas recommended by your physician.   If your physician prescribed you separate tubes of 5-fluourouracil and calcipotriene, apply a thin layer of 5-fluorouracil followed by a thin layer of calcipotriene.   Avoid contact with your eyes, nostrils, and mouth. Do not use 5-fluorouracil/calcipotriene cream on infected or open wounds.   You will develop redness, irritation and some crusting at areas where you have pre-cancer damage/actinic keratoses. IF YOU DEVELOP PAIN, BLEEDING, OR SIGNIFICANT CRUSTING, STOP THE TREATMENT EARLY - you have already gotten a good response and the actinic keratoses should clear up well.  Wash your hands after applying 5-fluorouracil 5% cream on your skin.   A moisturizer or sunscreen with a minimum SPF 30 should be applied each morning.   Once you have finished the treatment, you can apply a thin layer of Vaseline twice a day to irritated areas to soothe and calm the areas more quickly. If you experience significant discomfort, contact your physician.  For some patients it is necessary to repeat the treatment for best results.  SIDE EFFECTS: When using 5-fluorouracil/calcipotriene cream, you  may have mild irritation, such as redness, dryness, swelling, or a mild burning sensation. This usually resolves within 2 weeks. The more actinic keratoses you have, the more  redness and inflammation you can expect during treatment. Eye irritation has been reported rarely. If this occurs, please let us know.  If you have any trouble using this cream, please call the office. If you have any other questions about this information, please do not hesitate to ask me before you leave the office.   Cryotherapy Aftercare  Wash gently with soap and water everyday.   Apply Vaseline and Band-Aid daily until healed.    Due to recent changes in healthcare laws, you may see results of your pathology and/or laboratory studies on MyChart before the doctors have had a chance to review them. We understand that in some cases there may be results that are confusing or concerning to you. Please understand that not all results are received at the same time and often the doctors may need to interpret multiple results in order to provide you with the best plan of care or course of treatment. Therefore, we ask that you please give [place] 2 business days to thoroughly review all your results before contacting the office for clarification. Should we see a critical lab result, you will be contacted sooner.   If You Need Anything After Your Visit  If you have any questions or concerns for your doctor, please call our main line at [phone] and press option 4 to reach your doctor's medical assistant. If no one answers, please leave a voicemail as directed and we will return your call as soon as possible. Messages left after 4 pm will be answered the following business day.   You may also send [place] a message via [place]. We typically respond to MyChart messages within 1-2 business days.  For prescription refills, please ask your pharmacy to contact our office. Our fax number is [phone].  If you have an urgent issue when the clinic is closed that cannot wait until the next business day, you can page your doctor at the number below.    Please note that while we do our best to be available for  urgent issues outside of office hours, we are not available 24/7.   If you have an urgent issue and are unable to reach [place], you may choose to seek medical care at your doctor's office, retail clinic, urgent care center, or emergency room.  If you have a medical emergency, please immediately call 911 or go to the emergency department.  Pager Numbers  - Dr. Nehemiah Massed: [phone]  - Dr. Laurence Ferrari: [phone]  - Dr. Nicole Kindred: [phone]  In the event of inclement weather, please call our main line at [phone] for an update on the status of any delays or closures.  Dermatology Medication Tips: Please keep the boxes that topical medications come in in order to help keep track of the instructions about where and how to use these. Pharmacies typically print the medication instructions only on the boxes and not directly on the medication tubes.   If your medication is too expensive, please contact our office at [phone] option 4 or send [place] a message through [place].   We are unable to tell what your co-pay for medications will be in advance as this is different depending on your insurance coverage. However, we may be able to find a substitute medication at lower cost or fill out paperwork to get insurance to cover a needed  medication.   If a prior authorization is required to get your medication covered by your insurance company, please allow [place] 1-2 business days to complete this process.  Drug prices often vary depending on where the prescription is filled and some pharmacies may offer cheaper prices.  The website www.goodrx.com contains coupons for medications through different pharmacies. The prices here do not account for what the cost may be with help from insurance (it may be cheaper with your insurance), but the website can give you the price if you did not use any insurance.  - You can print the associated coupon and take it with your prescription to the pharmacy.  - You may also  stop by our office during regular business hours and pick up a GoodRx coupon card.  - If you need your prescription sent electronically to a different pharmacy, notify our office through [facility] or by phone at [phone] option 4.     Si Usted Necesita Algo Despus de Su Visita  Tambin puede enviarnos un mensaje a travs de Pharmacist, community. Por lo general respondemos a los mensajes de MyChart en el transcurso de 1 a 2 das hbiles.  Para renovar recetas, por favor pida a su farmacia que se ponga en contacto con nuestra oficina. Harland Dingwall de fax es [place] [phone].  Si tiene un asunto urgente cuando la clnica est cerrada y que no puede esperar hasta el siguiente da hbil, puede llamar/localizar a su doctor(a) al nmero que aparece a continuacin.   Por favor, tenga en cuenta que aunque hacemos todo lo posible para estar disponibles para asuntos urgentes fuera del horario de [place], no estamos disponibles las 24 horas del da, los 7 das de la [place].   Si tiene un problema urgente y no puede comunicarse con nosotros, puede optar por buscar atencin mdica  en el consultorio de su doctor(a), en una clnica privada, en un centro de atencin urgente o en una sala de emergencias.  Si tiene Engineering geologist, por favor llame inmediatamente al 911 o vaya a la sala de emergencias.  Nmeros de bper  - Dr. Nehemiah Massed: [phone]  - Dra. Moye: [phone]  - Dra. Nicole Kindred: [phone]  En caso de inclemencias del [place], por favor llame a Johnsie Kindred principal al [phone] para una actualizacin sobre el [place] de cualquier retraso o cierre.  Consejos para la medicacin en dermatologa: Por favor, guarde las cajas en las que vienen los medicamentos de uso tpico para ayudarle a seguir las instrucciones sobre dnde y cmo usarlos. Las farmacias generalmente imprimen las instrucciones del medicamento slo en las cajas y no directamente en los tubos del [place].   Si  su medicamento es muy caro, por favor, pngase en contacto con Zigmund Daniel llamando al [phone] y presione la opcin 4 o envenos un mensaje a travs de Pharmacist, community.   No podemos decirle cul ser su copago por los medicamentos por adelantado ya que esto es diferente dependiendo de la cobertura de su seguro. Sin embargo, es posible que podamos encontrar un medicamento sustituto a Electrical engineer un formulario para que el seguro cubra el medicamento que se considera necesario.   Si se requiere una autorizacin previa para que su compaa de seguros [place] su medicamento, por favor permtanos de 1 a 2 das hbiles para completar este proceso.  Los precios de los medicamentos varan con frecuencia dependiendo del Environmental consultant de dnde se surte la receta y alguna farmacias pueden ofrecer precios ms baratos.  El  sitio web www.goodrx.com tiene cupones para medicamentos de Airline pilot. Los precios aqu no tienen en cuenta lo que podra costar con la ayuda del seguro (puede ser ms barato con su seguro), pero el sitio web puede darle el precio si no utiliz Research scientist (physical sciences).  - Puede imprimir el cupn correspondiente y llevarlo con su receta a la farmacia.  - Tambin puede pasar por nuestra oficina durante el horario de atencin regular y Charity fundraiser una tarjeta de cupones de GoodRx.  - Si necesita que su receta se enve electrnicamente a una farmacia diferente, informe a nuestra oficina a travs de MyChart de [place] o por telfono llamando al [phone] y presione la opcin 4.

## 2021-12-02 NOTE — Progress Notes (Signed)
Follow-Up Visit   Subjective  [name] is a 88 y.o. male who presents for the following: Recheck BCC (right medial canthus/lower eyelid, BX and EDC 09/01/21) and Actinic Keratosis (Face, scalp, 44mf/u, LN2, discussed 5FU/Calcipotriene for future, pt would like to start cream).  The following portions of the chart were reviewed this encounter and updated as appropriate:   Tobacco  Allergies  Meds  Problems  Med Hx  Surg Hx  Fam Hx     Review of Systems:  No other skin or systemic complaints except as noted in HPI or Assessment and Plan.  Objective  Well appearing patient in no apparent distress; mood and affect are within normal limits.  A focused examination was performed including face, scalp, ears. Relevant physical exam findings are noted in the Assessment and Plan.  face, ears x 18 (18) Pink scaly macules  R medial canthus/lower eyelid Well healed scar with no evidence of recurrence.    Assessment & Plan   Actinic Damage with PreCancerous Actinic Keratoses Counseling for Topical Chemotherapy Management: Patient exhibits: - Severe, confluent actinic changes with pre-cancerous actinic keratoses that is secondary to cumulative UV radiation exposure over time - Condition that is severe; chronic, not at goal. - diffuse scaly erythematous macules and papules with underlying dyspigmentation - Discussed Prescription "Field Treatment" topical Chemotherapy for Severe, Chronic Confluent Actinic Changes with Pre-Cancerous Actinic Keratoses Field treatment involves treatment of an entire area of skin that has confluent Actinic Changes (Sun/ Ultraviolet light damage) and PreCancerous Actinic Keratoses by method of PhotoDynamic Therapy (PDT) and/or prescription Topical Chemotherapy agents such as 5-fluorouracil, 5-fluorouracil/calcipotriene, and/or imiquimod.  The purpose is to decrease the number of clinically evident and subclinical PreCancerous lesions to prevent progression  to development of skin cancer by chemically destroying early precancer changes that may or may not be visible.  It has been shown to reduce the risk of developing skin cancer in the treated area. As a result of treatment, redness, scaling, crusting, and open sores may occur during treatment course. One or more than one of these methods may be used and may have to be used several times to control, suppress and eliminate the PreCancerous changes. Discussed treatment course, expected reaction, and possible side effects. - Recommend daily broad spectrum sunscreen SPF 30+ to sun-exposed areas, reapply every 2 hours as needed.  - Staying in the shade or wearing long sleeves, sun glasses (UVA+UVB protection) and wide brim hats (4-inch brim around the entire circumference of the hat) are also recommended. - Call for new or changing lesions.  - On 12/17/2021 - Start 5-fluorouracil/calcipotriene cream twice a day for 7 days to affected areas including forehead up into hairline and temples. Prescription sent to [facility]. Patient advised they will receive an email to purchase the medication online and have it sent to their home. Patient provided with handout reviewing treatment course and side effects and advised to call or message [place] MyChart with any concerns.  Reviewed course of treatment and expected reaction.  Patient advised to expect inflammation and crusting and advised that erosions are possible.  Patient advised to be diligent with sun protection during and after treatment. Counseled to keep medication out of reach of children and pets.   AK (actinic keratosis) (18) face, ears x 18  Destruction of lesion - face, ears x 18 Complexity: simple   Destruction method: cryotherapy   Informed consent: discussed and consent obtained   Timeout:  patient name, date of  birth, surgical site, and procedure verified Lesion destroyed using liquid nitrogen: Yes   Region frozen until ice  ball extended beyond lesion: Yes   Outcome: patient tolerated procedure well with no complications   Post-procedure details: wound care instructions given    fluorouracil (EFUDEX) 5 % cream - face, ears x 18 Apply topically 2 (two) times daily. Starting on 12/17/2021 apply to forehead up into hairline and temples bid for 7 days  History of basal cell carcinoma (BCC) R medial canthus/lower eyelid  Clear. Observe for recurrence. Call clinic for new or changing lesions.  Recommend regular skin exams, daily broad-spectrum spf 30+ sunscreen use, and photoprotection.     Return in about 3 months (around 03/04/2022) for AK f/u.  I, [name], RMA, am acting as scribe for Sarina Ser, MD . Documentation: I have reviewed the above documentation for accuracy and completeness, and I agree with the above.  Sarina Ser, MD

## 2021-12-11 ENCOUNTER — Encounter: Payer: Self-pay | Admitting: Dermatology

## 2022-03-11 IMAGING — CT CT HEAD W/O CM
3 series · 15 of 47 positions shown, 18 images · non-contrast
Comparison: CT head dated 12/20/2011

CLINICAL DATA: Syncope, fall, head laceration, on Eliquis

EXAM:
CT HEAD WITHOUT CONTRAST
CT CERVICAL SPINE WITHOUT CONTRAST
TECHNIQUE: Multidetector CT imaging of the head and cervical spine was
performed following the standard protocol without intravenous
contrast. Multiplanar CT image reconstructions of the cervical spine
were also generated.

[Series 2: head wo · axial · 0.45mm/px · z∈[-152,-17]mm · 9 of 33 slices shown, 12 images]
[im 3/33  brain]
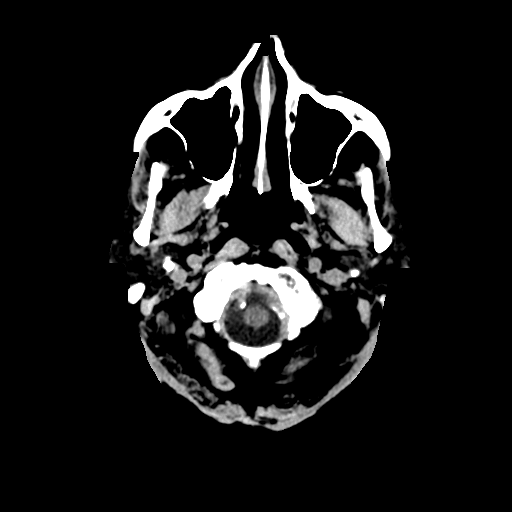
[im 3/33  bone]
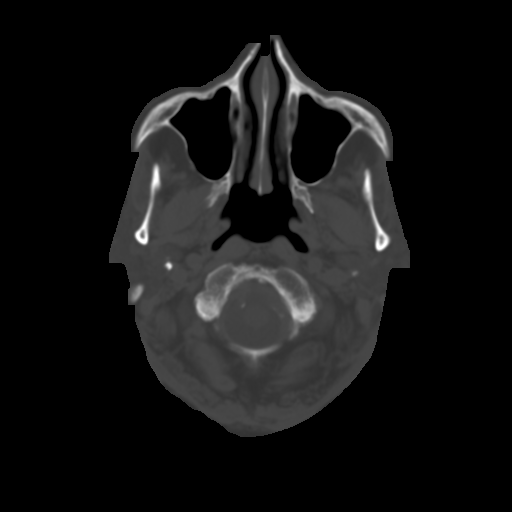
[im 6/33  brain]
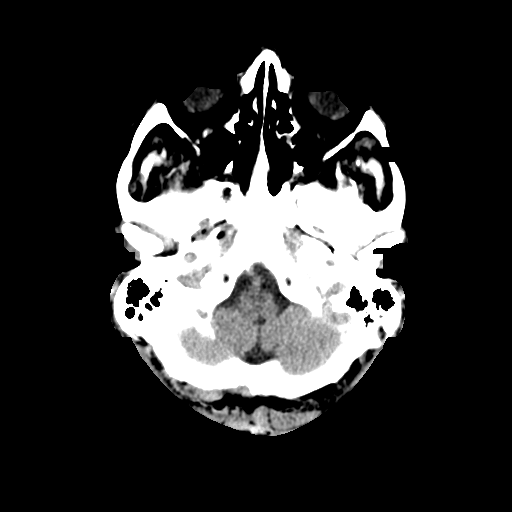
[im 9/33  brain]
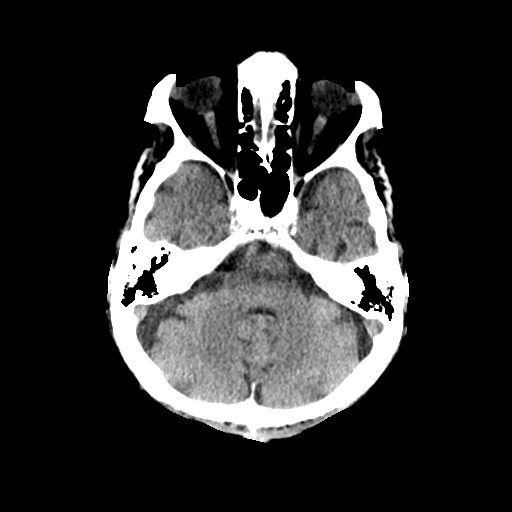
[im 13/33  brain]
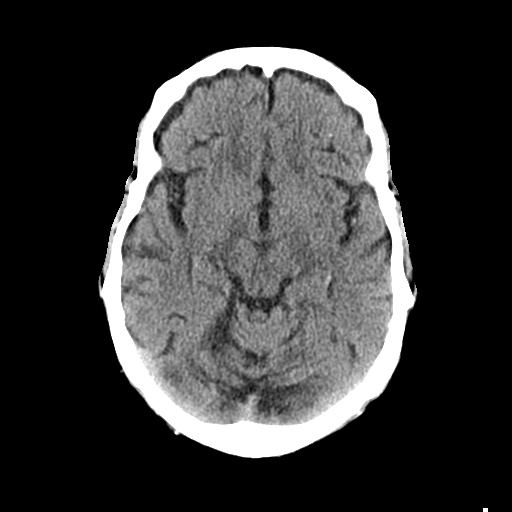
[im 17/33  brain]
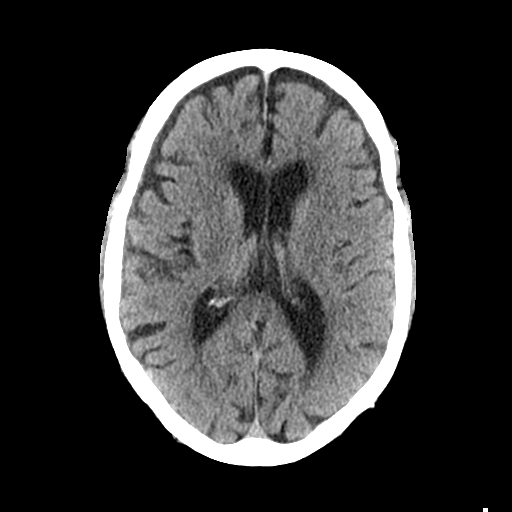
[im 17/33  bone]
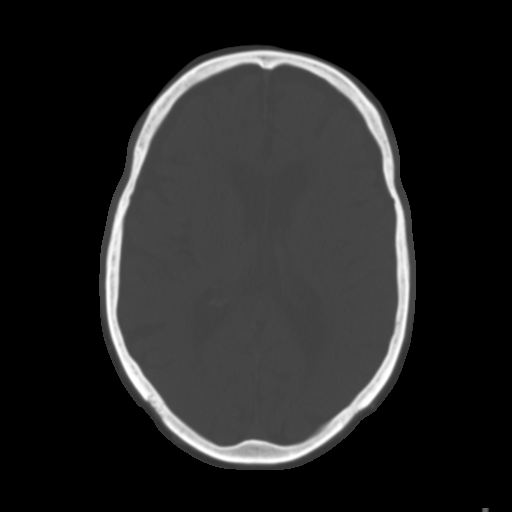
[im 20/33  brain]
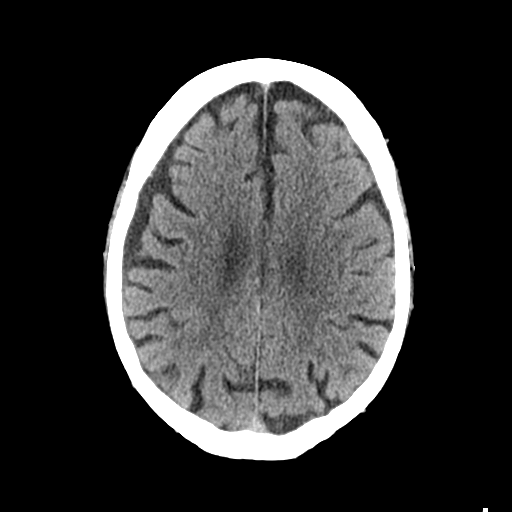
[im 24/33  brain]
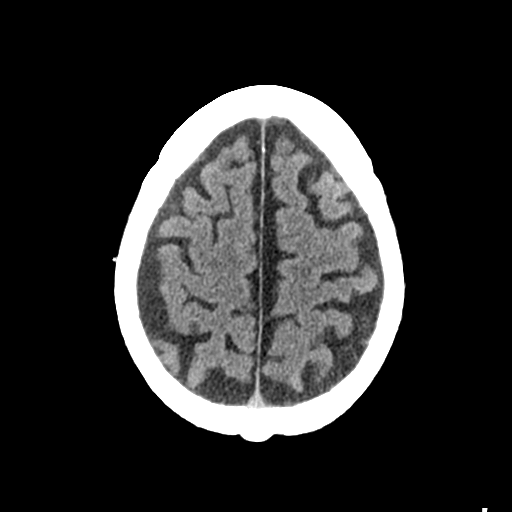
[im 27/33  brain]
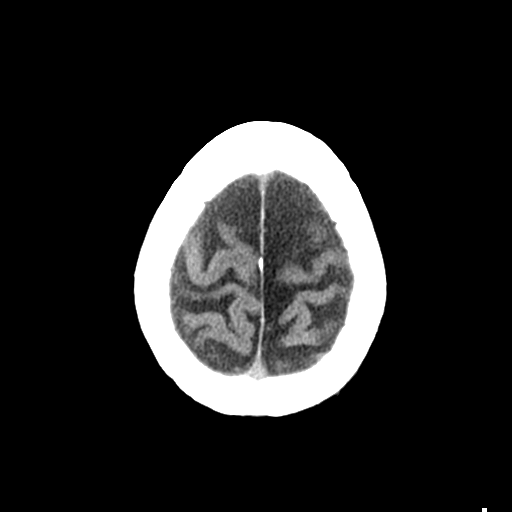
[im 30/33  brain]
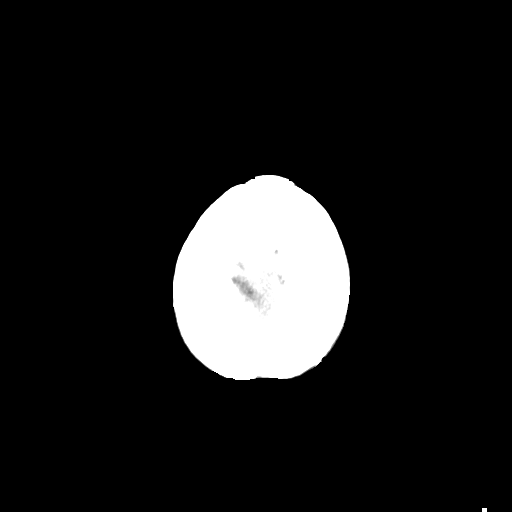
[im 30/33  bone]
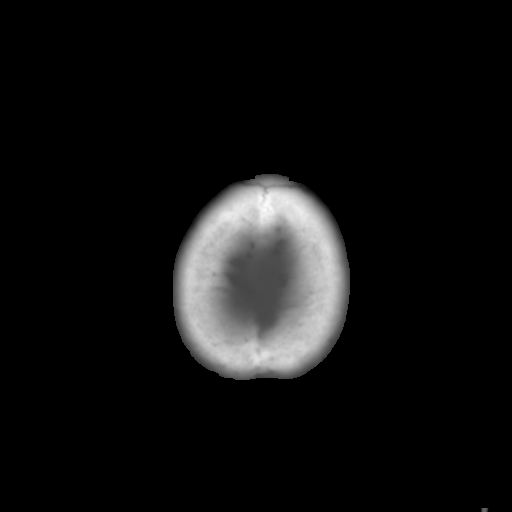

[Series 4: coronal soft tissue · coronal · 0.35mm/px · 3 of 70 slices shown]
[im 24/70  brain]
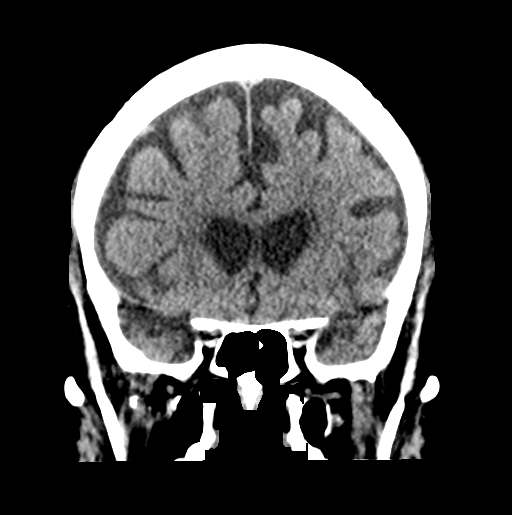
[im 31/70  brain]
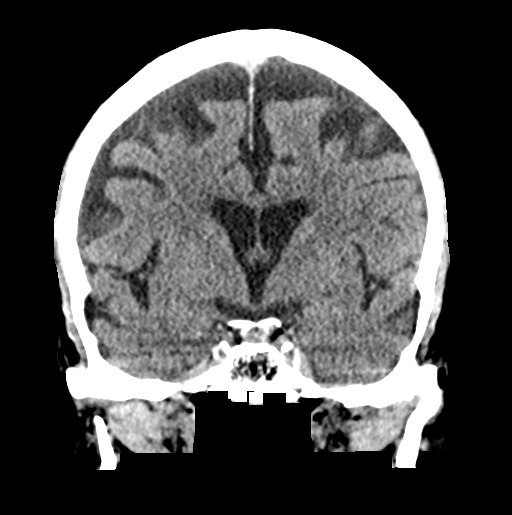
[im 39/70  brain]
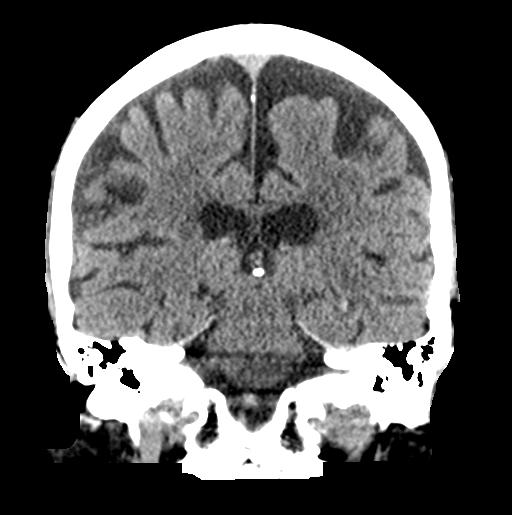

[Series 5: sagittal soft tissue · sagittal · 0.37mm/px · 3 of 56 slices shown]
[im 19/56  brain]
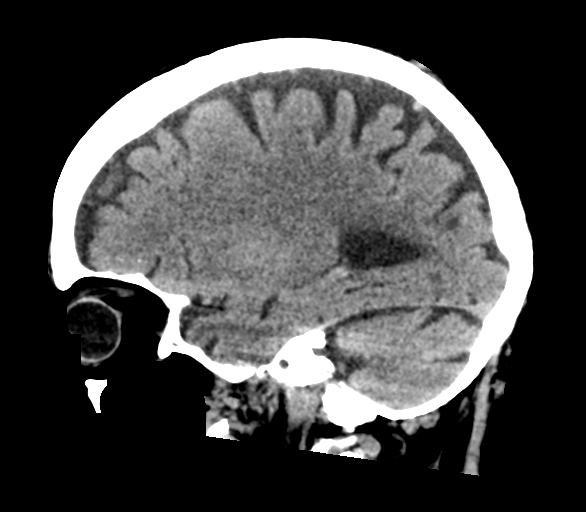
[im 28/56  brain]
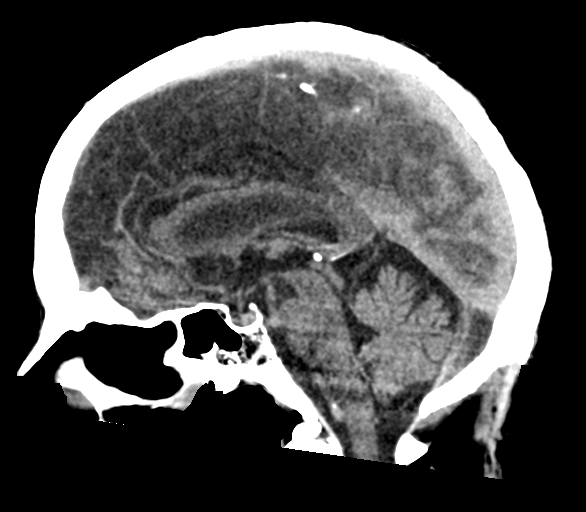
[im 37/56  brain]
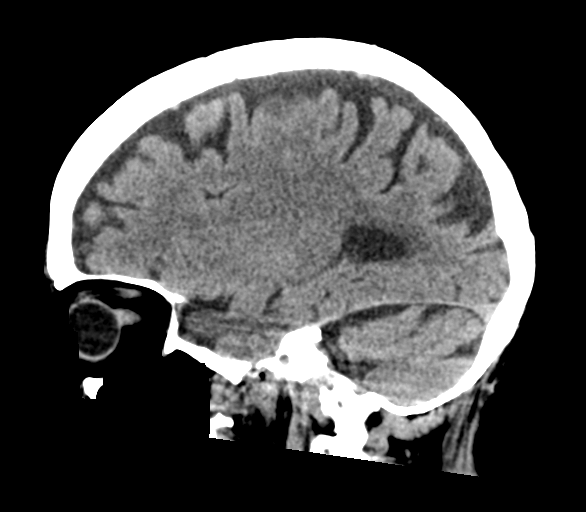

[15 of 47 positions shown; findings below may reference images not displayed]

FINDINGS: CT HEAD FINDINGS

Brain: No evidence of acute infarction, hemorrhage, hydrocephalus,
extra-axial collection or mass lesion/mass effect.

Mild cortical atrophy. Subcortical white matter and periventricular
small vessel ischemic changes.

Vascular: Intracranial atherosclerosis.

Skull: Normal. Negative for fracture or focal lesion.

Sinuses/Orbits: The visualized paranasal sinuses are essentially
clear. The mastoid air cells are unopacified.

Other: Soft tissue laceration along the left vertex (coronal image
52).

CT CERVICAL SPINE FINDINGS

Alignment: Normal cervical lordosis.

Skull base and vertebrae: No acute fracture. No primary bone lesion
or focal pathologic process.

Soft tissues and spinal canal: No prevertebral fluid or swelling. No
visible canal hematoma.

Disc levels: Moderate degenerative changes of the mid cervical
spine. Spinal canal is patent.

Upper chest: Visualized lung apices are clear.

Other: 3.5 cm left paratracheal nodule likely reflects a substernal
goiter. In the setting of significant comorbidities or limited life
expectancy, no follow-up recommended (ref: [HOSPITAL]. [DATE]): 143-50).
IMPRESSION: Soft tissue laceration along the left vertex. No evidence of
calvarial fracture.

No evidence of acute intracranial abnormality. Mild cortical atrophy
with small vessel ischemic changes.

No evidence of traumatic injury to the cervical spine. Moderate
multilevel degenerative changes.

## 2022-03-15 ENCOUNTER — Ambulatory Visit: Payer: Medicare PPO | Admitting: Dermatology

## 2022-03-15 VITALS — BP 110/80

## 2022-03-15 DIAGNOSIS — L820 Inflamed seborrheic keratosis: Secondary | ICD-10-CM | POA: Diagnosis not present

## 2022-03-15 DIAGNOSIS — L570 Actinic keratosis: Secondary | ICD-10-CM | POA: Diagnosis not present

## 2022-03-15 DIAGNOSIS — L578 Other skin changes due to chronic exposure to nonionizing radiation: Secondary | ICD-10-CM

## 2022-03-15 NOTE — Progress Notes (Signed)
   Follow-Up Visit   Subjective  [name] is a 89 y.o. male who presents for the following: Actinic Keratosis (3 month follow up - face and ears treated with LN2, forehead and temples treated with 5FU/Calcipotriene cream). The patient has spots, moles and lesions to be evaluated, some may be new or changing and the patient has concerns that these could be cancer.  The following portions of the chart were reviewed this encounter and updated as appropriate:   Tobacco  Allergies  Meds  Problems  Med Hx  Surg Hx  Fam Hx     Review of Systems:  No other skin or systemic complaints except as noted in HPI or Assessment and Plan.  Objective  Well appearing patient in no apparent distress; mood and affect are within normal limits.  A focused examination was performed including scalp, face, ears, arms, hands. Relevant physical exam findings are noted in the Assessment and Plan.  Face x 15, arms/hands x 6 (21) Erythematous thin papules/macules with gritty scale.   Scalp, face, neck (10) Erythematous stuck-on, waxy papule or plaque   Assessment & Plan   Actinic Damage - chronic, secondary to cumulative UV radiation exposure/sun exposure over time - diffuse scaly erythematous macules with underlying dyspigmentation - Recommend daily broad spectrum sunscreen SPF 30+ to sun-exposed areas, reapply every 2 hours as needed.  - Recommend staying in the shade or wearing long sleeves, sun glasses (UVA+UVB protection) and wide brim hats (4-inch brim around the entire circumference of the hat). - Call for new or changing lesions.  AK (actinic keratosis) (21) Face x 15, arms/hands x 6  Destruction of lesion - Face x 15, arms/hands x 6 Complexity: simple   Destruction method: cryotherapy   Informed consent: discussed and consent obtained   Timeout:  patient name, date of birth, surgical site, and procedure verified Lesion destroyed using liquid nitrogen: Yes   Region frozen until ice  ball extended beyond lesion: Yes   Outcome: patient tolerated procedure well with no complications   Post-procedure details: wound care instructions given    Related Medications fluorouracil (EFUDEX) 5 % cream Apply topically 2 (two) times daily. Starting on 12/17/2021 apply to forehead up into hairline and temples bid for 7 days  Inflamed seborrheic keratosis (10) Scalp, face, neck  Destruction of lesion - Scalp, face, neck Complexity: simple   Destruction method: cryotherapy   Informed consent: discussed and consent obtained   Timeout:  patient name, date of birth, surgical site, and procedure verified Lesion destroyed using liquid nitrogen: Yes   Region frozen until ice ball extended beyond lesion: Yes   Outcome: patient tolerated procedure well with no complications   Post-procedure details: wound care instructions given     Return for 3-4 months, AK follow up.  I, Ashok Cordia, CMA, am acting as scribe for Sarina Ser, MD . Documentation: I have reviewed the above documentation for accuracy and completeness, and I agree with the above.  Sarina Ser, MD

## 2022-03-15 NOTE — Patient Instructions (Signed)
Cryotherapy Aftercare  Wash gently with soap and water everyday.   Apply Vaseline and Band-Aid daily until healed.     Due to recent changes in healthcare laws, you may see results of your pathology and/or laboratory studies on MyChart before the doctors have had a chance to review them. We understand that in some cases there may be results that are confusing or concerning to you. Please understand that not all results are received at the same time and often the doctors may need to interpret multiple results in order to provide you with the best plan of care or course of treatment. Therefore, we ask that you please give us 2 business days to thoroughly review all your results before contacting the office for clarification. Should we see a critical lab result, you will be contacted sooner.   If You Need Anything After Your Visit  If you have any questions or concerns for your doctor, please call our main line at [phone] and press option 4 to reach your doctor's medical assistant. If no one answers, please leave a voicemail as directed and we will return your call as soon as possible. Messages left after 4 pm will be answered the following business day.   You may also send us a message via MyChart. We typically respond to MyChart messages within 1-2 business days.  For prescription refills, please ask your pharmacy to contact our office. Our fax number is [phone].  If you have an urgent issue when the clinic is closed that cannot wait until the next business day, you can page your doctor at the number below.    Please note that while we do our best to be available for urgent issues outside of office hours, we are not available 24/7.   If you have an urgent issue and are unable to reach us, you may choose to seek medical care at your doctor's office, retail clinic, urgent care center, or emergency room.  If you have a medical emergency, please immediately call 911 or go to the  emergency department.  Pager Numbers  - Dr. Kowalski: [phone]  - Dr. Moye: [phone]  - Dr. Stewart: [phone]  In the event of inclement weather, please call our main line at [phone] for an update on the status of any delays or closures.  Dermatology Medication Tips: Please keep the boxes that topical medications come in in order to help keep track of the instructions about where and how to use these. Pharmacies typically print the medication instructions only on the boxes and not directly on the medication tubes.   If your medication is too expensive, please contact our office at [phone] option 4 or send us a message through MyChart.   We are unable to tell what your co-pay for medications will be in advance as this is different depending on your insurance coverage. However, we may be able to find a substitute medication at lower cost or fill out paperwork to get insurance to cover a needed medication.   If a prior authorization is required to get your medication covered by your insurance company, please allow us 1-2 business days to complete this process.  Drug prices often vary depending on where the prescription is filled and some pharmacies may offer cheaper prices.  The website www.goodrx.com contains coupons for medications through different pharmacies. The prices here do not account for what the cost may be with help from insurance (it may be cheaper with your insurance), but the website can   give you the price if you did not use any insurance.  - You can print the associated coupon and take it with your prescription to the pharmacy.  - You may also stop by our office during regular business hours and pick up a GoodRx coupon card.  - If you need your prescription sent electronically to a different pharmacy, notify our office through [facility] MyChart or by phone at [phone] option 4.     Si Usted Necesita Algo Despus de Su Visita  Tambin puede  enviarnos un mensaje a travs de MyChart. Por lo general respondemos a los mensajes de MyChart en el transcurso de 1 a 2 das hbiles.  Para renovar recetas, por favor pida a su farmacia que se ponga en contacto con nuestra oficina. Nuestro nmero de fax es el [phone].  Si tiene un asunto urgente cuando la clnica est cerrada y que no puede esperar hasta el siguiente da hbil, puede llamar/localizar a su doctor(a) al nmero que aparece a continuacin.   Por favor, tenga en cuenta que aunque hacemos todo lo posible para estar disponibles para asuntos urgentes fuera del horario de oficina, no estamos disponibles las 24 horas del da, los 7 das de la semana.   Si tiene un problema urgente y no puede comunicarse con nosotros, puede optar por buscar atencin mdica  en el consultorio de su doctor(a), en una clnica privada, en un centro de atencin urgente o en una sala de emergencias.  Si tiene una emergencia mdica, por favor llame inmediatamente al 911 o vaya a la sala de emergencias.  Nmeros de bper  - Dr. Kowalski: [phone]  - Dra. Moye: [phone]  - Dra. Stewart: [phone]  En caso de inclemencias del tiempo, por favor llame a nuestra lnea principal al [phone] para una actualizacin sobre el estado de cualquier retraso o cierre.  Consejos para la medicacin en dermatologa: Por favor, guarde las cajas en las que vienen los medicamentos de uso tpico para ayudarle a seguir las instrucciones sobre dnde y cmo usarlos. Las farmacias generalmente imprimen las instrucciones del medicamento slo en las cajas y no directamente en los tubos del medicamento.   Si su medicamento es muy caro, por favor, pngase en contacto con nuestra oficina llamando al [phone] y presione la opcin 4 o envenos un mensaje a travs de MyChart.   No podemos decirle cul ser su copago por los medicamentos por adelantado ya que esto es diferente dependiendo de la cobertura de su seguro.  Sin embargo, es posible que podamos encontrar un medicamento sustituto a menor costo o llenar un formulario para que el seguro cubra el medicamento que se considera necesario.   Si se requiere una autorizacin previa para que su compaa de seguros cubra su medicamento, por favor permtanos de 1 a 2 das hbiles para completar este proceso.  Los precios de los medicamentos varan con frecuencia dependiendo del lugar de dnde se surte la receta y alguna farmacias pueden ofrecer precios ms baratos.  El sitio web www.goodrx.com tiene cupones para medicamentos de diferentes farmacias. Los precios aqu no tienen en cuenta lo que podra costar con la ayuda del seguro (puede ser ms barato con su seguro), pero el sitio web puede darle el precio si no utiliz ningn seguro.  - Puede imprimir el cupn correspondiente y llevarlo con su receta a la farmacia.  - Tambin puede pasar por nuestra oficina durante el horario de atencin regular y recoger una tarjeta de cupones de GoodRx.  -   Si necesita que su receta se enve electrnicamente a una farmacia diferente, informe a nuestra oficina a travs de MyChart de [place] o por telfono llamando al [phone] y presione la opcin 4.  

## 2022-03-20 ENCOUNTER — Encounter: Payer: Self-pay | Admitting: Dermatology

## 2022-06-21 ENCOUNTER — Inpatient Hospital Stay
Admission: EM | Admit: 2022-06-21 | Discharge: 2022-06-23 | DRG: 398 | Disposition: A | Discharge status: Home / Self Care | Payer: Medicare PPO | Attending: Student | Admitting: Student

## 2022-06-21 ENCOUNTER — Emergency Department: Payer: Medicare PPO

## 2022-06-21 ENCOUNTER — Other Ambulatory Visit: Payer: Self-pay

## 2022-06-21 ENCOUNTER — Encounter: Payer: Self-pay | Admitting: Emergency Medicine

## 2022-06-21 ENCOUNTER — Ambulatory Visit: Payer: Medicare PPO | Admitting: Dermatology

## 2022-06-21 DIAGNOSIS — Z87891 Personal history of nicotine dependence: Secondary | ICD-10-CM

## 2022-06-21 DIAGNOSIS — E1122 Type 2 diabetes mellitus with diabetic chronic kidney disease: Secondary | ICD-10-CM | POA: Diagnosis present

## 2022-06-21 DIAGNOSIS — I2510 Atherosclerotic heart disease of native coronary artery without angina pectoris: Secondary | ICD-10-CM | POA: Diagnosis present

## 2022-06-21 DIAGNOSIS — I701 Atherosclerosis of renal artery: Secondary | ICD-10-CM | POA: Diagnosis not present

## 2022-06-21 DIAGNOSIS — K551 Chronic vascular disorders of intestine: Secondary | ICD-10-CM | POA: Diagnosis not present

## 2022-06-21 DIAGNOSIS — K37 Unspecified appendicitis: Secondary | ICD-10-CM | POA: Diagnosis present

## 2022-06-21 DIAGNOSIS — K353 Acute appendicitis with localized peritonitis, without perforation or gangrene: Secondary | ICD-10-CM

## 2022-06-21 DIAGNOSIS — Z66 Do not resuscitate: Secondary | ICD-10-CM | POA: Diagnosis present

## 2022-06-21 DIAGNOSIS — Z951 Presence of aortocoronary bypass graft: Secondary | ICD-10-CM | POA: Diagnosis not present

## 2022-06-21 DIAGNOSIS — K3580 Unspecified acute appendicitis: Principal | ICD-10-CM

## 2022-06-21 DIAGNOSIS — N289 Disorder of kidney and ureter, unspecified: Secondary | ICD-10-CM

## 2022-06-21 DIAGNOSIS — Z8679 Personal history of other diseases of the circulatory system: Secondary | ICD-10-CM | POA: Diagnosis not present

## 2022-06-21 DIAGNOSIS — I5020 Unspecified systolic (congestive) heart failure: Secondary | ICD-10-CM | POA: Insufficient documentation

## 2022-06-21 DIAGNOSIS — I714 Abdominal aortic aneurysm, without rupture, unspecified: Secondary | ICD-10-CM | POA: Diagnosis present

## 2022-06-21 DIAGNOSIS — I739 Peripheral vascular disease, unspecified: Secondary | ICD-10-CM | POA: Diagnosis present

## 2022-06-21 DIAGNOSIS — M19041 Primary osteoarthritis, right hand: Secondary | ICD-10-CM | POA: Diagnosis present

## 2022-06-21 DIAGNOSIS — I7142 Juxtarenal abdominal aortic aneurysm, without rupture: Secondary | ICD-10-CM | POA: Diagnosis not present

## 2022-06-21 DIAGNOSIS — Z8249 Family history of ischemic heart disease and other diseases of the circulatory system: Secondary | ICD-10-CM | POA: Diagnosis not present

## 2022-06-21 DIAGNOSIS — I10 Essential (primary) hypertension: Secondary | ICD-10-CM | POA: Diagnosis present

## 2022-06-21 DIAGNOSIS — Z515 Encounter for palliative care: Secondary | ICD-10-CM | POA: Diagnosis not present

## 2022-06-21 DIAGNOSIS — E785 Hyperlipidemia, unspecified: Secondary | ICD-10-CM | POA: Diagnosis present

## 2022-06-21 DIAGNOSIS — Z85828 Personal history of other malignant neoplasm of skin: Secondary | ICD-10-CM

## 2022-06-21 DIAGNOSIS — I130 Hypertensive heart and chronic kidney disease with heart failure and stage 1 through stage 4 chronic kidney disease, or unspecified chronic kidney disease: Secondary | ICD-10-CM | POA: Diagnosis present

## 2022-06-21 DIAGNOSIS — Z9049 Acquired absence of other specified parts of digestive tract: Secondary | ICD-10-CM | POA: Diagnosis not present

## 2022-06-21 DIAGNOSIS — I4891 Unspecified atrial fibrillation: Secondary | ICD-10-CM | POA: Diagnosis present

## 2022-06-21 DIAGNOSIS — Z882 Allergy status to sulfonamides status: Secondary | ICD-10-CM

## 2022-06-21 DIAGNOSIS — Z79899 Other long term (current) drug therapy: Secondary | ICD-10-CM | POA: Diagnosis not present

## 2022-06-21 DIAGNOSIS — I252 Old myocardial infarction: Secondary | ICD-10-CM

## 2022-06-21 DIAGNOSIS — Z602 Problems related to living alone: Secondary | ICD-10-CM | POA: Diagnosis present

## 2022-06-21 DIAGNOSIS — K3532 Acute appendicitis with perforation and localized peritonitis, without abscess: Secondary | ICD-10-CM | POA: Diagnosis present

## 2022-06-21 DIAGNOSIS — I70202 Unspecified atherosclerosis of native arteries of extremities, left leg: Secondary | ICD-10-CM | POA: Diagnosis not present

## 2022-06-21 DIAGNOSIS — N1832 Chronic kidney disease, stage 3b: Secondary | ICD-10-CM | POA: Diagnosis present

## 2022-06-21 DIAGNOSIS — Z95828 Presence of other vascular implants and grafts: Secondary | ICD-10-CM

## 2022-06-21 DIAGNOSIS — K5730 Diverticulosis of large intestine without perforation or abscess without bleeding: Secondary | ICD-10-CM | POA: Diagnosis not present

## 2022-06-21 DIAGNOSIS — E1151 Type 2 diabetes mellitus with diabetic peripheral angiopathy without gangrene: Secondary | ICD-10-CM | POA: Diagnosis present

## 2022-06-21 DIAGNOSIS — I340 Nonrheumatic mitral (valve) insufficiency: Secondary | ICD-10-CM | POA: Diagnosis present

## 2022-06-21 DIAGNOSIS — M19042 Primary osteoarthritis, left hand: Secondary | ICD-10-CM | POA: Diagnosis present

## 2022-06-21 DIAGNOSIS — Z955 Presence of coronary angioplasty implant and graft: Secondary | ICD-10-CM

## 2022-06-21 DIAGNOSIS — I5022 Chronic systolic (congestive) heart failure: Secondary | ICD-10-CM | POA: Diagnosis present

## 2022-06-21 DIAGNOSIS — Z7189 Other specified counseling: Secondary | ICD-10-CM | POA: Diagnosis not present

## 2022-06-21 DIAGNOSIS — Z7901 Long term (current) use of anticoagulants: Secondary | ICD-10-CM | POA: Diagnosis not present

## 2022-06-21 DIAGNOSIS — Z9889 Other specified postprocedural states: Secondary | ICD-10-CM | POA: Diagnosis not present

## 2022-06-21 DIAGNOSIS — Z885 Allergy status to narcotic agent status: Secondary | ICD-10-CM

## 2022-06-21 LAB — COMPREHENSIVE METABOLIC PANEL
ALT: 12 U/L (ref 0–44)
AST: 23 U/L (ref 15–41)
Albumin: 4 g/dL (ref 3.5–5.0)
Alkaline Phosphatase: 64 U/L (ref 38–126)
Anion gap: 12 (ref 5–15)
BUN: 35 mg/dL — ABNORMAL HIGH (ref 8–23)
CO2: 26 mmol/L (ref 22–32)
Calcium: 9.6 mg/dL (ref 8.9–10.3)
Chloride: 102 mmol/L (ref 98–111)
Creatinine, Ser: 1.59 mg/dL — ABNORMAL HIGH (ref 0.61–1.24)
GFR, Estimated: 41 mL/min — ABNORMAL LOW (ref >60)
Glucose, Bld: 150 mg/dL — ABNORMAL HIGH (ref 70–99)
Potassium: 3.5 mmol/L (ref 3.5–5.1)
Sodium: 140 mmol/L (ref 135–145)
Total Bilirubin: 1.2 mg/dL (ref 0.3–1.2)
Total Protein: 6.5 g/dL (ref 6.5–8.1)

## 2022-06-21 LAB — URINALYSIS, ROUTINE W REFLEX MICROSCOPIC
Bilirubin Urine: NEGATIVE
Glucose, UA: NEGATIVE mg/dL
Hgb urine dipstick: NEGATIVE
Ketones, ur: NEGATIVE mg/dL
Leukocytes,Ua: NEGATIVE
Nitrite: NEGATIVE
Protein, ur: NEGATIVE mg/dL
Specific Gravity, Urine: 1.019 (ref 1.005–1.030)
pH: 5 (ref 5.0–8.0)

## 2022-06-21 LAB — CBC
HCT: 36.8 % — ABNORMAL LOW (ref 39.0–52.0)
Hemoglobin: 11.8 g/dL — ABNORMAL LOW (ref 13.0–17.0)
MCH: 30.7 pg (ref 26.0–34.0)
MCHC: 32.1 g/dL (ref 30.0–36.0)
MCV: 95.8 fL (ref 80.0–100.0)
Platelets: 163 10*3/uL (ref 150–400)
RBC: 3.84 MIL/uL — ABNORMAL LOW (ref 4.22–5.81)
RDW: 14.3 % (ref 11.5–15.5)
WBC: 9.5 10*3/uL (ref 4.0–10.5)
nRBC: 0 % (ref 0.0–0.2)

## 2022-06-21 LAB — LIPASE, BLOOD: Lipase: 32 U/L (ref 11–51)

## 2022-06-21 MED ORDER — OXYCODONE HCL 5 MG PO TABS
5.0000 mg | ORAL_TABLET | Freq: Four times a day (QID) | ORAL | Status: DC | PRN
  Administered 2022-06-22: 5 mg via ORAL
  Filled 2022-06-21: qty 1

## 2022-06-21 MED ORDER — ACETAMINOPHEN 650 MG RE SUPP: 650.0000 mg | Freq: Four times a day (QID) | RECTAL | Status: DC | PRN

## 2022-06-21 MED ORDER — PIPERACILLIN-TAZOBACTAM 3.375 G IVPB 30 MIN
3.3750 g | Freq: Once | INTRAVENOUS | Status: AC
  Administered 2022-06-21: 3.375 g via INTRAVENOUS
  Filled 2022-06-21: qty 50

## 2022-06-21 MED ORDER — MORPHINE SULFATE (PF) 2 MG/ML IV SOLN
2.0000 mg | INTRAVENOUS | Status: AC | PRN
  Administered 2022-06-21: 2 mg via INTRAVENOUS
  Filled 2022-06-21: qty 1

## 2022-06-21 MED ORDER — OXYCODONE HCL 5 MG PO TABS
2.5000 mg | ORAL_TABLET | Freq: Once | ORAL | Status: AC
  Administered 2022-06-21: 2.5 mg via ORAL
  Filled 2022-06-21: qty 1

## 2022-06-21 MED ORDER — DILTIAZEM HCL ER COATED BEADS 180 MG PO CP24
180.0000 mg | ORAL_CAPSULE | Freq: Every day | ORAL | Status: DC
  Administered 2022-06-22 – 2022-06-23 (×2): 180 mg via ORAL
  Filled 2022-06-21 (×2): qty 1

## 2022-06-21 MED ORDER — ATORVASTATIN CALCIUM 20 MG PO TABS
40.0000 mg | ORAL_TABLET | Freq: Every day | ORAL | Status: DC
  Administered 2022-06-22: 40 mg via ORAL
  Filled 2022-06-21: qty 2

## 2022-06-21 MED ORDER — SENNOSIDES-DOCUSATE SODIUM 8.6-50 MG PO TABS: 1.0000 | ORAL_TABLET | Freq: Every evening | ORAL | Status: DC | PRN

## 2022-06-21 MED ORDER — HYDROCHLOROTHIAZIDE 12.5 MG PO TABS
12.5000 mg | ORAL_TABLET | Freq: Every day | ORAL | Status: DC
  Administered 2022-06-22 – 2022-06-23 (×2): 12.5 mg via ORAL
  Filled 2022-06-21 (×2): qty 1

## 2022-06-21 MED ORDER — ACETAMINOPHEN 325 MG PO TABS: 650.0000 mg | ORAL_TABLET | Freq: Four times a day (QID) | ORAL | Status: DC | PRN

## 2022-06-21 MED ORDER — PIPERACILLIN-TAZOBACTAM 3.375 G IVPB
3.3750 g | Freq: Three times a day (TID) | INTRAVENOUS | Status: DC
  Administered 2022-06-22 – 2022-06-23 (×4): 3.375 g via INTRAVENOUS
  Filled 2022-06-21 (×4): qty 50

## 2022-06-21 MED ORDER — ONDANSETRON HCL 4 MG/2ML IJ SOLN
4.0000 mg | Freq: Four times a day (QID) | INTRAMUSCULAR | Status: DC | PRN
  Administered 2022-06-22: 4 mg via INTRAVENOUS
  Filled 2022-06-21: qty 2

## 2022-06-21 MED ORDER — LACTATED RINGERS IV SOLN: INTRAVENOUS | Status: DC

## 2022-06-21 MED ORDER — VITAMIN D 25 MCG (1000 UNIT) PO TABS
5000.0000 [IU] | ORAL_TABLET | Freq: Every day | ORAL | Status: DC
  Administered 2022-06-22 – 2022-06-23 (×2): 5000 [IU] via ORAL
  Filled 2022-06-21 (×2): qty 5

## 2022-06-21 MED ORDER — ONDANSETRON HCL 4 MG PO TABS: 4.0000 mg | ORAL_TABLET | Freq: Four times a day (QID) | ORAL | Status: DC | PRN

## 2022-06-21 MED ORDER — ACETAMINOPHEN 500 MG PO TABS
1000.0000 mg | ORAL_TABLET | Freq: Once | ORAL | Status: AC
  Administered 2022-06-21: 1000 mg via ORAL
  Filled 2022-06-21: qty 2

## 2022-06-21 MED ORDER — HEPARIN SODIUM (PORCINE) 5000 UNIT/ML IJ SOLN: 5000.0000 [IU] | Freq: Three times a day (TID) | INTRAMUSCULAR | Status: DC

## 2022-06-21 MED ORDER — LISINOPRIL-HYDROCHLOROTHIAZIDE 20-12.5 MG PO TABS: 1.0000 | ORAL_TABLET | Freq: Two times a day (BID) | ORAL | Status: DC

## 2022-06-21 MED ORDER — LISINOPRIL 20 MG PO TABS
20.0000 mg | ORAL_TABLET | Freq: Every day | ORAL | Status: DC
  Administered 2022-06-22 – 2022-06-23 (×2): 20 mg via ORAL
  Filled 2022-06-21: qty 2
  Filled 2022-06-21: qty 1

## 2022-06-21 MED ORDER — HYDRALAZINE HCL 20 MG/ML IJ SOLN: 5.0000 mg | Freq: Four times a day (QID) | INTRAMUSCULAR | Status: DC | PRN

## 2022-06-21 MED ORDER — IOHEXOL 350 MG/ML SOLN
100.0000 mL | Freq: Once | INTRAVENOUS | Status: AC | PRN
  Administered 2022-06-21: 80 mL via INTRAVENOUS

## 2022-06-21 NOTE — ED Provider Notes (Signed)
[facility] Provider Note    Event Date/Time   First MD Initiated Contact with Patient 06/21/22 1501     (approximate)   History   Abdominal Pain   HPI  [name] is a 89 y.o. male with history of abdominal aortic aneurysm status post stent placement, A-fib on Eliquis, coronary disease who comes in with right lower quadrant pain.  Patient reports having right lower quadrant pain that started today.  Patient reports a little bit of nausea associated with it.  He denies any chest pain, shortness of breath.  He denies any pain in the upper abdomen.  He reports it is all in the right lower abdomen.  He denies any pain in his testicles.  He denies ever having this previously.  He states that he lives alone and that he drove here himself he has not taken anything to try to help with the pain.  Physical Exam   Triage Vital Signs: ED Triage Vitals  Enc Vitals Group     BP 06/21/22 1303 (!) 149/60     Pulse Rate 06/21/22 1303 82     Resp 06/21/22 1303 18     Temp 06/21/22 1303 98.1 F (36.7 C)     Temp Source 06/21/22 1303 Oral     SpO2 06/21/22 1303 97 %     Weight 06/21/22 1439 158 lb 15.2 oz (72.1 kg)     Height 06/21/22 1439 6' (1.829 m)     Head Circumference --      Peak Flow --      Pain Score 06/21/22 1303 8     Pain Loc --      Pain Edu? --      Excl. in GC? --     Most recent vital signs: Vitals:   06/21/22 1303  BP: (!) 149/60  Pulse: 82  Resp: 18  Temp: 98.1 F (36.7 C)  SpO2: 97%     General: Awake, no distress.  CV:  Good peripheral perfusion.  Resp:  Normal effort.  Abd:  No distention.  Other:  Patient with tenderness in the right lower quadrant.   ED Results / Procedures / Treatments   Labs (all labs ordered are listed, but only abnormal results are displayed) Labs Reviewed  COMPREHENSIVE METABOLIC PANEL - Abnormal; Notable for the following components:      Result Value   Glucose, Bld 150 (*)    BUN 35 (*)     Creatinine, Ser 1.59 (*)    GFR, Estimated 41 (*)    All other components within normal limits  CBC - Abnormal; Notable for the following components:   RBC 3.84 (*)    Hemoglobin 11.8 (*)    HCT 36.8 (*)    All other components within normal limits  LIPASE, BLOOD  URINALYSIS, ROUTINE W REFLEX MICROSCOPIC      RADIOLOGY I have reviewed the CT personally interpreted and some possible inflammation of the appendix PROCEDURES:  Critical Care performed: No  Procedures   MEDICATIONS ORDERED IN ED: Medications  acetaminophen (TYLENOL) tablet 650 mg (has no administration in time range)    Or  acetaminophen (TYLENOL) suppository 650 mg (has no administration in time range)  ondansetron (ZOFRAN) tablet 4 mg (has no administration in time range)    Or  ondansetron (ZOFRAN) injection 4 mg (has no administration in time range)  heparin injection 5,000 Units (has no administration in time range)  senna-docusate (Senokot-S) tablet 1 tablet (has  no administration in time range)  lactated ringers infusion (has no administration in time range)  acetaminophen (TYLENOL) tablet 1,000 mg (1,000 mg Oral Given 06/21/22 1522)  iohexol (OMNIPAQUE) 350 MG/ML injection 100 mL (80 mLs Intravenous Contrast Given 06/21/22 1552)  piperacillin-tazobactam (ZOSYN) IVPB 3.375 g (3.375 g Intravenous New Bag/Given 06/21/22 1700)     IMPRESSION / MDM / ASSESSMENT AND PLAN / ED COURSE  I reviewed the triage vital signs and the nursing notes.   Patient's presentation is most consistent with acute presentation with potential threat to life or bodily function.   Patient comes in with right lower quadrant pain in the setting of known abdominal aneurysm status postrepair.  Will get CT angio to ensure no leak of aneurysm versus appendicitis versus kidney stone.  Patient is driving home so we will start off with Tylenol for pain given he does not have anybody else who can pick him up.  He denies any chest pain,  shortness of breath and his abdominal pain is all lower in nature so doubt ACS.  Denies any recent falls and hitting his head.  He is on Eliquis.   CBC reassuring white count urine without evidence of UTI CMP shows stable creatinine  Patient CT scan concerning for potential appendicitis.  Also other incidental findings vascularly related that I did discuss with Dr. Wyn Quaker and all are chronic and do not need any acute intervention.  For the appendicitis I did discuss with Dr. Maia Plan who recommends IV antibiotics for now but will come see the patient.  Recommend admission to the hospitalist.  Discussed with patient and he felt comfortable with this plan.  Patient does not meet sepsis criteria so blood cultures, lactate were not ordered and Zosyn was started.     FINAL CLINICAL IMPRESSION(S) / ED DIAGNOSES   Final diagnoses:  Acute appendicitis, unspecified acute appendicitis type     Rx / DC Orders   ED Discharge Orders     None        Note:  This document was prepared using Dragon voice recognition software and may include unintentional dictation errors.   Concha Se, MD 06/21/22 [phone]

## 2022-06-21 NOTE — H&P (Addendum)
History and Physical   [name] ZOX:[id number] DOB: 04/15/1930 DOA: 06/21/2022  PCP: Gracelyn Nurse, MD  Patient coming from: home via POV  I have personally briefly reviewed patient's old medical records in [facility] EMR.  Chief Concern: Right-sided abdominal pain  HPI: [name] is a 89 year old male with history of hyperlipidemia, atrial fibrillation on Eliquis, hypertension, who presents emergency department for chief concerns of right lower quadrant abdominal pain and nausea  Vitals in the ED showed temperature of 98.1, respiration rate of 18, heart rate of 82, blood pressure 149/60, SpO2 of 97% on room air.  Serum sodium is 140, potassium 3.5, chloride 102, bicarb 26, BUN of 35, serum creatinine 1.59, nonfasting blood glucose 150, EGFR 41, WBC 9.5, platelets 163, hemoglobin 11.8.  UA was negative for leukocytes and nitrates.  CTA abdomen pelvis with and without contrast: Inflammation at the cecal tip and extending inferiorly over a region measuring approximately 13 mm in diameter.  Vague suggestion of an appendix measuring approximately 9 mm and surrounding fluid and inflammation.  Findings are most likely representative of acute appendicitis.  No focal abscess and associated extraluminal air is identified.  EDP discussed with general surgery who recommends conservative management with IV antibiotic at this time  ED treatment: Zosyn IV, acetaminophen 1000 mg p.o. one-time dose ---------------------------- At bedside, patient is able to tell me his name, age, location, current calendar year.  Reports that at approximately 10 to 11 AM this morning, he developed right lower quadrant abdominal pain.  He states initially the pain was a dull tenderness and then developed into a sharp 10 out of 10 pain.  He reports he has never had this pain before.  He drove himself to get lodged to try to get something to eat in order to help with the pain however the pain became so severe  that he decided to drive himself to the hospital.  He denies trauma to his person.  He denies fever, nausea, vomiting, chest pain, shortness of breath, dysuria, hematuria, diarrhea, swelling of his lower extremities.  Social history: He lives at home by himself.  He denies tobacco, EtOH, recreational drug use.  He is retired.  ROS: Constitutional: no weight change, no fever ENT/Mouth: no sore throat, no rhinorrhea Eyes: no eye pain, no vision changes Cardiovascular: no chest pain, no dyspnea,  no edema, no palpitations Respiratory: no cough, no sputum, no wheezing Gastrointestinal: no nausea, no vomiting, no diarrhea, no constipation, + right lower quadrant abdominal pain Genitourinary: no urinary incontinence, no dysuria, no hematuria Musculoskeletal: no arthralgias, no myalgias Skin: no skin lesions, no pruritus, Neuro: + weakness, no loss of consciousness, no syncope Psych: no anxiety, no depression, + decrease appetite Heme/Lymph: no bruising, no bleeding  ED Course: Discussed with emergency medicine provider, patient requiring hospitalization for chief concerns of acute appendicitis.  Assessment/Plan  Principal Problem:   Appendicitis Active Problems:   Essential hypertension   Impaired renal function   AAA (abdominal aortic aneurysm) (HCC)   Coronary artery disease involving native coronary artery of native heart without angina pectoris   Hyperlipidemia   S/P CABG x 3   Atrial fibrillation (HCC)   PVD (peripheral vascular disease) (HCC)   Stage 3b chronic kidney disease (HCC)   Heart failure with reduced ejection fraction (HCC)   Assessment and Plan:  * Appendicitis Continue Zosyn per pharmacy General surgery, Dr. Maia Plan has been consulted by EDP and states conservative management at this time Symptomatic support: Oxycodone 5  mg every 6 hours as needed for moderate pain, 2 days ordered; morphine 2 mg IV every 4 hours as needed for severe pain, 15 hours of coverage  ordered LR 150 mL/h, 1 day ordered Admit to telemetry cardiac, inpatient  Heart failure with reduced ejection fraction (HCC) Does not appear to be in acute exacerbation at this time Echo in 05/27/2019: Estimated left ventricular ejection fraction was 40 to 45%  Stage 3b chronic kidney disease (HCC) At baseline  Atrial fibrillation (HCC) Home Eliquis not resumed on admission, patient's last Eliquis dose was a.m. prior to ED presentation  S/P CABG x 3 Atorvastatin 40 mg nightly resumed  Hyperlipidemia Atorvastatin 40 mg nightly resumed  Essential hypertension Pending med reconciliation, patient does not remember what his antihypertensive medications name are Hydralazine 5 mg IV every 6 hours as needed for SBP greater 175, 4 days ordered  Pending med reconciliation, note for pharmacy consult order placed  Chart reviewed.   DVT prophylaxis: TED hose; AM team to initiate pharmacologic DVT prophylaxis when the benefits outweigh the risk Code Status: full code  Diet: N.p.o. Family Communication: Updated brother in law, [name] '[name] at [phone] per patient request Disposition Plan: Pending clinical course Consults called: General surgery Admission status: Telemetry cardiac, inpatient  Past Medical History:  Diagnosis Date   AAA (abdominal aortic aneurysm) (HCC)    Actinic keratosis    Arthritis    in fingers and hands   Basal cell carcinoma 09/01/2021   Right medial canthus/lower eyelid. Nodular BCC. Excixed 09/01/2021   Cancer (HCC)    skin cancer removed several   Diabetes mellitus without complication (HCC)    Dysrhythmia    atrial fibrillation.  on eliquis   History of kidney stones    Hx of basal cell carcinoma 06/30/2016   L proximal mandible   Hx of basal cell carcinoma 07/19/2016   L medial lower eyelid   Hx of basal cell carcinoma 09/20/2016   R upper back lateral   Hx of basal cell carcinoma 12/01/2016   L post shoulder sup medial scapula   Hx of  basal cell carcinoma 12/01/2016   L forehead, 2.0cm above med brow   Hx of basal cell carcinoma 12/17/2018   L forehead aboove medial brow   Hx of melanoma in situ 08/16/2016   L proximal mandible/infra auricular   Hypertension    Mitral insufficiency    per dr. Juliann Pares   Myocardial infarct [facility]) 1990   Squamous cell carcinoma of skin 11/01/2017   R superior sideburn in hairline   Squamous cell carcinoma of skin 01/03/2018   L medial dorsum wrist lateral and medial   Squamous cell carcinoma of skin 01/03/2018   L proximal thumb   Squamous cell carcinoma of skin 04/12/2018   L dorsum proximal index finger   Squamous cell carcinoma of skin 04/12/2018   L dorsum medial proximal thumb   Squamous cell carcinoma of skin 07/04/2018   R superior sideburn   Squamous cell carcinoma of skin 07/04/2018   L dorsum proximal index finger   Squamous cell carcinoma of skin 11/13/2018   R helix   Squamous cell carcinoma of skin 03/05/2020   scalp   Transfusion history 1990   received several units of blood after CABG   Past Surgical History:  Procedure Laterality Date   ABDOMINAL AORTIC ANEURYSM REPAIR  2005, [facility]   Cardiac bypass  1990   [facility]. triple cabg   CARDIAC CATHETERIZATION  1990  stents placed but ultimately clotted and shut down.   CARDIAC SURGERY  1990   duplicate entry   CHOLECYSTECTOMY N/A 11/10/2008   Dr Lemar Livings   CORONARY ARTERY BYPASS GRAFT  1990   x 3. performed after coronary stents failed   ENDARTERECTOMY FEMORAL Right 05/29/2019   Procedure: ENDARTERECTOMY FEMORAL;  Surgeon: Annice Needy, MD;  Location: [facility] ORS;  Service: Vascular;  Laterality: Right;   EYE SURGERY Bilateral    cataract extractions with iol   INGUINAL HERNIA REPAIR Left 08/07/2017   Medium Ultra Pro mesh Surgeon: Earline Mayotte, MD;  Location: [facility] ORS;  Service: General;  Laterality: Left;   INSERTION OF ILIAC STENT Right 05/29/2019   Procedure: INSERTION OF ILIAC STENT ( AND SFA STENT);   Surgeon: Annice Needy, MD;  Location: [facility] ORS;  Service: Vascular;  Laterality: Right;   LOWER EXTREMITY ANGIOGRAPHY Right 05/27/2019   Procedure: LOWER EXTREMITY ANGIOGRAPHY;  Surgeon: Annice Needy, MD;  Location: [facility] INVASIVE CV LAB;  Service: Cardiovascular;  Laterality: Right;   Social History:  reports that he quit smoking about 34 years ago. His smoking use included cigarettes. He has never used smokeless tobacco. He reports that he does not drink alcohol and does not use drugs.  Allergies  Allergen Reactions   Morphine And Codeine Nausea And Vomiting   Sulfa Antibiotics Rash   Family History  Problem Relation Age of Onset   Cerebral aneurysm Mother        died at 50   CAD Father        died at 38   Family history: Family history reviewed and not pertinent  Prior to Admission medications   Medication Sig Start Date End Date Taking? Authorizing Provider  acetaminophen (TYLENOL) 325 MG tablet Take 2 tablets (650 mg total) by mouth every 6 (six) hours as needed for mild pain or fever. 05/30/19   Stegmayer, Cala Bradford A, PA-C  atorvastatin (LIPITOR) 40 MG tablet Take 40 mg by mouth daily. 06/26/14   [provider]  Cholecalciferol (VITAMIN D3) 5000 units CAPS Take 5,000 Units by mouth daily.    [provider]  diltiazem (CARDIZEM CD) 180 MG 24 hr capsule Take 180 mg by mouth daily. 08/31/20   [provider]  ELIQUIS 5 MG TABS tablet Take 1 tablet (5 mg total) by mouth 2 (two) times daily. 08/11/17   Earline Mayotte, MD  fluorouracil (EFUDEX) 5 % cream Apply topically 2 (two) times daily. Starting on 12/17/2021 apply to forehead up into hairline and temples bid for 7 days 12/02/21   Deirdre Evener, MD  glucose blood (BAYER CONTOUR TEST) test strip 1 strip by Does not apply route 3 (three) times daily. 03/14/14   [provider]  lisinopril-hydrochlorothiazide (PRINZIDE,ZESTORETIC) 20-12.5 MG tablet Take 1 tablet by mouth 2 (two) times daily.  02/26/15    [provider]  Omega-3 Fatty Acids (FISH OIL) 1000 MG CAPS Take 1,000 mg by mouth daily.     [provider]  predniSONE (DELTASONE) 10 MG tablet Take 1 tablet (10 mg total) by mouth daily. 6,5,4,3,2,1 six day taper 09/08/20   Evon Slack, PA-C  tetrahydrozoline (GOODSENSE EYE DROPS) 0.05 % ophthalmic solution Place 1 drop into both eyes daily as needed (dry eyes).    [provider]  vitamin B-12 (CYANOCOBALAMIN) 1000 MCG tablet Take 1,000 mcg by mouth 2 (two) times a week.    [provider]   Physical Exam: Vitals:  06/21/22 1439 06/21/22 1526 06/21/22 1900 06/21/22 2000  BP:  (!) 163/70 (!) 147/64 (!) 145/65  Pulse:  74 (!) 42 (!) 50  Resp:  16    Temp:  97.8 F (36.6 C)    TempSrc:  Oral    SpO2:  100% 96% 95%  Weight: 72.1 kg     Height: 6' (1.829 m)      Constitutional: appears younger than chronological age, NAD, calm Eyes: PERRL, lids and conjunctivae normal ENMT: Mucous membranes are moist. Posterior pharynx clear of any exudate or lesions. Age-appropriate dentition.  Mild bilateral hearing loss Neck: normal, supple, no masses, no thyromegaly Respiratory: clear to auscultation bilaterally, no wheezing, no crackles. Normal respiratory effort. No accessory muscle use.  Cardiovascular: Regular rate and rhythm, no murmurs / rubs / gallops. No extremity edema. 2+ pedal pulses. No carotid bruits.  Abdomen: + R sided tenderness, no masses palpated, no hepatosplenomegaly. Bowel sounds positive.  Musculoskeletal: no clubbing / cyanosis. No joint deformity upper and lower extremities. Good ROM, no contractures, no atrophy. Normal muscle tone.  Skin: no rashes, lesions, ulcers. No induration Neurologic: Sensation intact. Strength 5/5 in all 4.  Psychiatric: Normal judgment and insight. Alert and oriented x 3. Normal mood.   EKG: Not indicated at this time  Chest x-ray on Admission: I personally reviewed and I agree with radiologist reading as  below.  CT Angio Abd/Pel W and/or Wo Contrast  Result Date: 06/21/2022 CLINICAL DATA:  Right lower quadrant abdominal pain, nausea and history of abdominal aortic aneurysm. Prior right common iliac artery stenting extending into the distal aspect of an aortic tube graft. EXAM: CT ANGIOGRAPHY ABDOMEN AND PELVIS WITH CONTRAST TECHNIQUE: Multidetector CT imaging of the abdomen and pelvis was performed using the standard protocol during bolus administration of intravenous contrast. Multiplanar reconstructed images and MIPs were obtained and reviewed to evaluate the vascular anatomy. RADIATION DOSE REDUCTION: This exam was performed according to the departmental dose-optimization program which includes automated exposure control, adjustment of the mA and/or kV according to patient size and/or use of iterative reconstruction technique. CONTRAST:  80mL OMNIPAQUE IOHEXOL 350 MG/ML SOLN COMPARISON:  Prior CTA of the abdomen and pelvis on 06/29/2015 FINDINGS: VASCULAR Aorta: Beginning immediately below the level of the renal arteries, residual aneurysmal disease of the native aorta measures up to approximately 4.5 x 4.6 cm. This segment is slightly larger compared to 2017 but fairly similar in diameter with increased opacification of the entire lumen compared to prior to aortic repair. A tube graft is now present extending from the infrarenal segment to just above the bifurcation. This graft measures approximately 3 cm in diameter and demonstrates a mild amount of internal mural thrombus. No evidence of aortic rupture or dissection. Significant atherosclerosis and mild aneurysmal disease of the visualized lower descending thoracic aorta demonstrating calcified plaque and irregular mural thrombus. Maximal caliber is approximately 3.3 cm. Celiac: Mild stenosis at the origin of the celiac axis of 30-40%. SMA: There is some probable progression of stenosis at the origin of the superior mesenteric artery now likely greater  than 50% narrowed and potentially greater than 70% stenosed. Renals: Origin stenosis of a single right renal artery likely progressive and approximately 50-70%. There is an early bifurcation of this vessel with component of tight stenosis in the superior branch. There is evidence of collateralization due to this tight stenosis with a tortuous network of collaterals extending inferiorly just behind the IVC and eventually demonstrating anastomosis between renal artery supply and an enlarged  right L3 lumbar artery. Stable stenosis at the origin of a solitary left renal artery of approximately 50-70%. IMA: Occluded origin and reconstituted distal branches by SMA collaterals. Inflow: Right-sided iliac stenting with a common iliac stent extending just into the distal aspect of the aortic tube graft and additional overlapping stents extending into the mid external iliac artery. Stented segments appear widely patent. Chronic occlusion of the left common, internal and external iliac arteries. Proximal Outflow: The right common femoral artery is normally patent as is the femoral bifurcation. A proximal SFA stent is partially visualized. The left common femoral artery is chronically occluded. Veins: Venous phase imaging demonstrates normally patent venous structures in the abdomen and pelvis. Review of the MIP images confirms the above findings. NON-VASCULAR Lower chest: No acute abnormality. Hepatobiliary: No focal liver abnormality is seen. Status post cholecystectomy. No biliary dilatation. Pancreas: Unremarkable. No pancreatic ductal dilatation or surrounding inflammatory changes. Spleen: Normal in size without focal abnormality. Adrenals/Urinary Tract: No adrenal masses. Some interval enlargement multiple bilateral renal cysts which all demonstrate simple fluid density, consistent with Bosniak I cysts. These do not require dedicated follow-up. The bladder is unremarkable. Stomach/Bowel: Beginning at the cecal tip and  extending inferiorly, inflammation is present with lack of a discrete visualized intact appendix. There is vague suggestion of an appendix measuring approximately 9 mm and surrounding fluid and inflammation over a region measuring approximately 13 mm in diameter. Some inflammation extends towards the right inguinal canal and findings are most likely representative of acute appendicitis. No visualized calcified appendicolith. No focal abscess or associated extraluminal air is identified. No evidence of bowel obstruction, ileus or free intraperitoneal air. No focal bowel lesions are identified. Diverticulosis of the sigmoid and descending colon without evidence of diverticulitis. Midline surgical clips/mesh related to prior abdominal surgery. Lymphatic: No enlarged abdominal or pelvic lymph nodes. Reproductive: Prostate is unremarkable. Other: No abdominal wall hernia or abnormality. No abdominopelvic ascites. Musculoskeletal: No acute or significant osseous findings. IMPRESSION: 1. Inflammation at the cecal tip and extending inferiorly over a region measuring approximately 13 mm in diameter. There is vague suggestion of an appendix measuring approximately 9 mm and surrounding fluid and inflammation. Findings are most likely representative of acute appendicitis. No focal abscess or associated extraluminal air is identified. 2. Evidence of prior aortic tube graft repair with residual aneurysmal disease of the juxta-renal native aorta measuring up to 4.5 x 4.6 cm. This segment is slightly larger compared to 2017 but fairly similar in diameter with increased opacification of the entire lumen compared to the prior CTA. 3. Chronic occlusion of the left common, internal and external iliac arteries. 4. 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 5. Progression of stenosis at the origin of the superior mesenteric artery now likely greater than 50% narrowed and potentially greater than 70% stenosed. 6. Progression of  tight stenosis at the origin of a single right renal artery of approximately 50-70%. There is an early bifurcation of this vessel with component of tight stenosis in the superior branch. Evidence of collateral artery development 2 compensate for this segmental stenosis with collaterals communicating between a right L3 lumbar artery and distal renal artery supply. 7. Stable stenosis at the origin of a solitary left renal artery of approximately 50-70%. 8. Diverticulosis of the sigmoid and descending colon without evidence of diverticulitis. Electronically Signed   By: Irish Lack M.D.   On: 06/21/2022 16:42    Labs on Admission: I have personally reviewed following labs  CBC: Recent Labs  Lab 06/21/22 1304  WBC 9.5  HGB 11.8*  HCT 36.8*  MCV 95.8  PLT 163   Basic Metabolic Panel: Recent Labs  Lab 06/21/22 1304  NA 140  K 3.5  CL 102  CO2 26  GLUCOSE 150*  BUN 35*  CREATININE 1.59*  CALCIUM 9.6   GFR: Estimated Creatinine Clearance: 31.5 mL/min (A) (by C-G formula based on SCr of 1.59 mg/dL (H)).  Liver Function Tests: Recent Labs  Lab 06/21/22 1304  AST 23  ALT 12  ALKPHOS 64  BILITOT 1.2  PROT 6.5  ALBUMIN 4.0   Recent Labs  Lab 06/21/22 1304  LIPASE 32   Urine analysis:    Component Value Date/Time   COLORURINE YELLOW (A) 06/21/2022 1444   APPEARANCEUR CLEAR (A) 06/21/2022 1444   LABSPEC 1.019 06/21/2022 1444   PHURINE 5.0 06/21/2022 1444   GLUCOSEU NEGATIVE 06/21/2022 1444   HGBUR NEGATIVE 06/21/2022 1444   BILIRUBINUR NEGATIVE 06/21/2022 1444   KETONESUR NEGATIVE 06/21/2022 1444   PROTEINUR NEGATIVE 06/21/2022 1444   NITRITE NEGATIVE 06/21/2022 1444   LEUKOCYTESUR NEGATIVE 06/21/2022 1444   This document was prepared using Dragon Voice Recognition software and may include unintentional dictation errors.  Dr. Sedalia Muta [facility]  If 7PM-7AM, please contact overnight-coverage provider If 7AM-7PM, please contact day attending  provider [email or web address]  06/21/2022, 8:25 PM

## 2022-06-21 NOTE — Assessment & Plan Note (Signed)
Does not appear to be in acute exacerbation at this time Echo in 05/27/2019: Estimated left ventricular ejection fraction was 40 to 45%

## 2022-06-21 NOTE — ED Triage Notes (Signed)
Patient to ED via POV for RLQ abd pain. Also having nausea. Symptoms started this AM.

## 2022-06-21 NOTE — Assessment & Plan Note (Signed)
-   Atorvastatin 40 mg nightly resumed 

## 2022-06-21 NOTE — Consult Note (Signed)
SURGICAL CONSULTATION NOTE   HISTORY OF PRESENT ILLNESS (HPI):  89 y.o. male presented to [facility] ED for evaluation of aminal pain. Patient reports he started having lower abdominal pain earlier this morning.  Then pain radiated to the right lower quadrant.  Pain is aggravated by palpating the right lower quadrant.  No alleviating factors.  Patient endorses nausea.  At the ED he was found with tenderness to palpation in the right lower quadrant.  Vital signs stable.  No fever.  White blood cell count within normal limits.  Due to the abdominal pain and chronic vascular history he had a CTA of the abdomen and pelvis that showed inflammation of the right lower quadrant with dilated appendix concerning for acute appendicitis.  No free air or free fluid.  I personally evaluated the images.  Patient has history of open abdominal aortic aneurysm repair in 2005, coronary artery disease status post CABG 1990, cholecystectomy in 2010, femoral endarterectomy with insertion of iliac stent in 2021, left inguinal hernia repair 2019.  Patient had history of A-fib on Eliquis.  Last dose of Eliquis this morning.  Surgery is consulted by Dr. Fuller Plan in this context for evaluation and management of suspected appendicitis.  PAST MEDICAL HISTORY (PMH):  Past Medical History:  Diagnosis Date   AAA (abdominal aortic aneurysm) (HCC)    Actinic keratosis    Arthritis    in fingers and hands   Basal cell carcinoma 09/01/2021   Right medial canthus/lower eyelid. Nodular BCC. Excixed 09/01/2021   Cancer (HCC)    skin cancer removed several   Diabetes mellitus without complication (HCC)    Dysrhythmia    atrial fibrillation.  on eliquis   History of kidney stones    Hx of basal cell carcinoma 06/30/2016   L proximal mandible   Hx of basal cell carcinoma 07/19/2016   L medial lower eyelid   Hx of basal cell carcinoma 09/20/2016   R upper back lateral   Hx of basal cell carcinoma 12/01/2016   L post shoulder sup  medial scapula   Hx of basal cell carcinoma 12/01/2016   L forehead, 2.0cm above med brow   Hx of basal cell carcinoma 12/17/2018   L forehead aboove medial brow   Hx of melanoma in situ 08/16/2016   L proximal mandible/infra auricular   Hypertension    Mitral insufficiency    per dr. Juliann Pares   Myocardial infarct [facility]) 1990   Squamous cell carcinoma of skin 11/01/2017   R superior sideburn in hairline   Squamous cell carcinoma of skin 01/03/2018   L medial dorsum wrist lateral and medial   Squamous cell carcinoma of skin 01/03/2018   L proximal thumb   Squamous cell carcinoma of skin 04/12/2018   L dorsum proximal index finger   Squamous cell carcinoma of skin 04/12/2018   L dorsum medial proximal thumb   Squamous cell carcinoma of skin 07/04/2018   R superior sideburn   Squamous cell carcinoma of skin 07/04/2018   L dorsum proximal index finger   Squamous cell carcinoma of skin 11/13/2018   R helix   Squamous cell carcinoma of skin 03/05/2020   scalp   Transfusion history 1990   received several units of blood after CABG     PAST SURGICAL HISTORY (PSH):  Past Surgical History:  Procedure Laterality Date   ABDOMINAL AORTIC ANEURYSM REPAIR  2005, [facility]   Cardiac bypass  1990   [facility]. triple cabg   CARDIAC CATHETERIZATION  1990  stents placed but ultimately clotted and shut down.   CARDIAC SURGERY  1990   duplicate entry   CHOLECYSTECTOMY N/A 11/10/2008   Dr Lemar Livings   CORONARY ARTERY BYPASS GRAFT  1990   x 3. performed after coronary stents failed   ENDARTERECTOMY FEMORAL Right 05/29/2019   Procedure: ENDARTERECTOMY FEMORAL;  Surgeon: Annice Needy, MD;  Location: [facility] ORS;  Service: Vascular;  Laterality: Right;   EYE SURGERY Bilateral    cataract extractions with iol   INGUINAL HERNIA REPAIR Left 08/07/2017   Medium Ultra Pro mesh Surgeon: Earline Mayotte, MD;  Location: [facility] ORS;  Service: General;  Laterality: Left;   INSERTION OF ILIAC STENT Right 05/29/2019    Procedure: INSERTION OF ILIAC STENT ( AND SFA STENT);  Surgeon: Annice Needy, MD;  Location: [facility] ORS;  Service: Vascular;  Laterality: Right;   LOWER EXTREMITY ANGIOGRAPHY Right 05/27/2019   Procedure: LOWER EXTREMITY ANGIOGRAPHY;  Surgeon: Annice Needy, MD;  Location: [facility] INVASIVE CV LAB;  Service: Cardiovascular;  Laterality: Right;     MEDICATIONS:  Prior to Admission medications   Medication Sig Start Date End Date Taking? Authorizing Provider  acetaminophen (TYLENOL) 325 MG tablet Take 2 tablets (650 mg total) by mouth every 6 (six) hours as needed for mild pain or fever. 05/30/19   Stegmayer, Cala Bradford A, PA-C  atorvastatin (LIPITOR) 40 MG tablet Take 40 mg by mouth daily. 06/26/14   [provider]  Cholecalciferol (VITAMIN D3) 5000 units CAPS Take 5,000 Units by mouth daily.    [provider]  diltiazem (CARDIZEM CD) 180 MG 24 hr capsule Take 180 mg by mouth daily. 08/31/20   [provider]  ELIQUIS 5 MG TABS tablet Take 1 tablet (5 mg total) by mouth 2 (two) times daily. 08/11/17   Earline Mayotte, MD  fluorouracil (EFUDEX) 5 % cream Apply topically 2 (two) times daily. Starting on 12/17/2021 apply to forehead up into hairline and temples bid for 7 days 12/02/21   Deirdre Evener, MD  glucose blood (BAYER CONTOUR TEST) test strip 1 strip by Does not apply route 3 (three) times daily. 03/14/14   [provider]  lisinopril-hydrochlorothiazide (PRINZIDE,ZESTORETIC) 20-12.5 MG tablet Take 1 tablet by mouth 2 (two) times daily.  02/26/15   [provider]  Omega-3 Fatty Acids (FISH OIL) 1000 MG CAPS Take 1,000 mg by mouth daily.     [provider]  predniSONE (DELTASONE) 10 MG tablet Take 1 tablet (10 mg total) by mouth daily. 6,5,4,3,2,1 six day taper 09/08/20   Evon Slack, PA-C  tetrahydrozoline (GOODSENSE EYE DROPS) 0.05 % ophthalmic solution Place 1 drop into both eyes daily as needed (dry eyes).    [provider]  vitamin  B-12 (CYANOCOBALAMIN) 1000 MCG tablet Take 1,000 mcg by mouth 2 (two) times a week.    [provider]     ALLERGIES:  Allergies  Allergen Reactions   Morphine And Codeine Nausea And Vomiting   Sulfa Antibiotics Rash     SOCIAL HISTORY:  Social History   Socioeconomic History   Marital status: Widowed    Spouse name: Not on file   Number of children: Not on file   Years of education: Not on file   Highest education level: Not on file  Occupational History   Not on file  Tobacco Use   Smoking status: Former    Years: 30    Types: Cigarettes    Quit date: 01/25/1988  Years since quitting: 34.4   Smokeless tobacco: Never  Vaping Use   Vaping Use: Never used  Substance and Sexual Activity   Alcohol use: No   Drug use: No   Sexual activity: Not Currently  Other Topics Concern   Not on file  Social History Narrative   Not on file   Social Determinants of Health   Financial Resource Strain: Not on file  Food Insecurity: Not on file  Transportation Needs: Not on file  Physical Activity: Not on file  Stress: Not on file  Social Connections: Not on file  Intimate Partner Violence: Not on file      FAMILY HISTORY:  Family History  Problem Relation Age of Onset   Cerebral aneurysm Mother        died at 71   CAD Father        died at 65     REVIEW OF SYSTEMS:  Constitutional: denies weight loss, fever, chills, or sweats  Eyes: denies any other vision changes, history of eye injury  ENT: denies sore throat, hearing problems  Respiratory: denies shortness of breath, wheezing  Cardiovascular: denies chest pain, palpitations  Gastrointestinal: Positive abdominal pain, nausea  Genitourinary: denies burning with urination or urinary frequency Musculoskeletal: denies any other joint pains or cramps  Skin: denies any other rashes or skin discolorations  Neurological: denies any other headache, dizziness, weakness  Psychiatric: denies any other depression,  anxiety   All other review of systems were negative   VITAL SIGNS:  Temp:  [97.8 F (36.6 C)-98.1 F (36.7 C)] 97.8 F (36.6 C) (05/28 1526) Pulse Rate:  [74-82] 74 (05/28 1526) Resp:  [16-18] 16 (05/28 1526) BP: (149-163)/(60-70) 163/70 (05/28 1526) SpO2:  [97 %-100 %] 100 % (05/28 1526) Weight:  [72.1 kg] 72.1 kg (05/28 1439)     Height: 6' (182.9 cm) Weight: 72.1 kg BMI (Calculated): 21.55   INTAKE/OUTPUT:  This shift: No intake/output data recorded.  Last 2 shifts: @IOLAST2SHIFTS @   PHYSICAL EXAM:  Constitutional:  -- Normal body habitus  -- Awake, alert, and oriented x3  Eyes:  -- Pupils equally round and reactive to light  -- No scleral icterus  Ear, nose, and throat:  -- No jugular venous distension  Pulmonary:  -- No crackles  -- Equal breath sounds bilaterally -- Breathing non-labored at rest Cardiovascular:  -- S1, S2 present  -- No pericardial rubs Gastrointestinal:  -- Abdomen soft, right tender to palpation in the right lower quadrant, non-distended, no guarding or rebound tenderness -- No abdominal masses appreciated, pulsatile or otherwise  Musculoskeletal and Integumentary:  -- Wounds: None appreciated -- Extremities: B/L UE and LE FROM, hands and feet warm, no edema  Neurologic:  -- Motor function: intact and symmetric -- Sensation: intact and symmetric   Labs:     Latest Ref Rng & Units 06/21/2022    1:04 PM 11/06/2020    9:01 PM 05/30/2019    7:50 AM  CBC  WBC 4.0 - 10.5 K/uL 9.5  6.9  8.6   Hemoglobin 13.0 - 17.0 g/dL 16.1  09.6  04.5   Hematocrit 39.0 - 52.0 % 36.8  41.0  34.2   Platelets 150 - 400 K/uL 163  220  152       Latest Ref Rng & Units 06/21/2022    1:04 PM 11/06/2020    9:01 PM 05/30/2019    7:50 AM  CMP  Glucose 70 - 99 mg/dL 409  811  914  BUN 8 - 23 mg/dL 35  36  24   Creatinine 0.61 - 1.24 mg/dL 1.61  0.96  0.45   Sodium 135 - 145 mmol/L 140  139  138   Potassium 3.5 - 5.1 mmol/L 3.5  3.5  3.9   Chloride 98 - 111  mmol/L 102  103  105   CO2 22 - 32 mmol/L 26  26  26    Calcium 8.9 - 10.3 mg/dL 9.6  40.9  8.1   Total Protein 6.5 - 8.1 g/dL 6.5     Total Bilirubin 0.3 - 1.2 mg/dL 1.2     Alkaline Phos 38 - 126 U/L 64     AST 15 - 41 U/L 23     ALT 0 - 44 U/L 12       Imaging studies:  EXAM: CT ANGIOGRAPHY ABDOMEN AND PELVIS WITH CONTRAST   TECHNIQUE: Multidetector CT imaging of the abdomen and pelvis was performed using the standard protocol during bolus administration of intravenous contrast. Multiplanar reconstructed images and MIPs were obtained and reviewed to evaluate the vascular anatomy.   RADIATION DOSE REDUCTION: This exam was performed according to the departmental dose-optimization program which includes automated exposure control, adjustment of the mA and/or kV according to patient size and/or use of iterative reconstruction technique.   CONTRAST:  80mL OMNIPAQUE IOHEXOL 350 MG/ML SOLN   COMPARISON:  Prior CTA of the abdomen and pelvis on 06/29/2015   FINDINGS: VASCULAR   Aorta: Beginning immediately below the level of the renal arteries, residual aneurysmal disease of the native aorta measures up to approximately 4.5 x 4.6 cm. This segment is slightly larger compared to 2017 but fairly similar in diameter with increased opacification of the entire lumen compared to prior to aortic repair. A tube graft is now present extending from the infrarenal segment to just above the bifurcation. This graft measures approximately 3 cm in diameter and demonstrates a mild amount of internal mural thrombus. No evidence of aortic rupture or dissection. Significant atherosclerosis and mild aneurysmal disease of the visualized lower descending thoracic aorta demonstrating calcified plaque and irregular mural thrombus. Maximal caliber is approximately 3.3 cm.   Celiac: Mild stenosis at the origin of the celiac axis of 30-40%.   SMA: There is some probable progression of stenosis at the  origin of the superior mesenteric artery now likely greater than 50% narrowed and potentially greater than 70% stenosed.   Renals: Origin stenosis of a single right renal artery likely progressive and approximately 50-70%. There is an early bifurcation of this vessel with component of tight stenosis in the superior branch. There is evidence of collateralization due to this tight stenosis with a tortuous network of collaterals extending inferiorly just behind the IVC and eventually demonstrating anastomosis between renal artery supply and an enlarged right L3 lumbar artery. Stable stenosis at the origin of a solitary left renal artery of approximately 50-70%.   IMA: Occluded origin and reconstituted distal branches by SMA collaterals.   Inflow: Right-sided iliac stenting with a common iliac stent extending just into the distal aspect of the aortic tube graft and additional overlapping stents extending into the mid external iliac artery. Stented segments appear widely patent.   Chronic occlusion of the left common, internal and external iliac arteries.   Proximal Outflow: The right common femoral artery is normally patent as is the femoral bifurcation. A proximal SFA stent is partially visualized. The left common femoral artery is chronically occluded.   Veins: Venous phase imaging  demonstrates normally patent venous structures in the abdomen and pelvis.   Review of the MIP images confirms the above findings.   NON-VASCULAR   Lower chest: No acute abnormality.   Hepatobiliary: No focal liver abnormality is seen. Status post cholecystectomy. No biliary dilatation.   Pancreas: Unremarkable. No pancreatic ductal dilatation or surrounding inflammatory changes.   Spleen: Normal in size without focal abnormality.   Adrenals/Urinary Tract: No adrenal masses. Some interval enlargement multiple bilateral renal cysts which all demonstrate simple fluid density, consistent with  Bosniak I cysts. These do not require dedicated follow-up. The bladder is unremarkable.   Stomach/Bowel: Beginning at the cecal tip and extending inferiorly, inflammation is present with lack of a discrete visualized intact appendix. There is vague suggestion of an appendix measuring approximately 9 mm and surrounding fluid and inflammation over a region measuring approximately 13 mm in diameter. Some inflammation extends towards the right inguinal canal and findings are most likely representative of acute appendicitis. No visualized calcified appendicolith. No focal abscess or associated extraluminal air is identified.   No evidence of bowel obstruction, ileus or free intraperitoneal air. No focal bowel lesions are identified. Diverticulosis of the sigmoid and descending colon without evidence of diverticulitis. Midline surgical clips/mesh related to prior abdominal surgery.   Lymphatic: No enlarged abdominal or pelvic lymph nodes.   Reproductive: Prostate is unremarkable.   Other: No abdominal wall hernia or abnormality. No abdominopelvic ascites.   Musculoskeletal: No acute or significant osseous findings.   IMPRESSION: 1. Inflammation at the cecal tip and extending inferiorly over a region measuring approximately 13 mm in diameter. There is vague suggestion of an appendix measuring approximately 9 mm and surrounding fluid and inflammation. Findings are most likely representative of acute appendicitis. No focal abscess or associated extraluminal air is identified. 2. Evidence of prior aortic tube graft repair with residual aneurysmal disease of the juxta-renal native aorta measuring up to 4.5 x 4.6 cm. This segment is slightly larger compared to 2017 but fairly similar in diameter with increased opacification of the entire lumen compared to the prior CTA. 3. Chronic occlusion of the left common, internal and external iliac arteries. 4. Chronic occlusion of the left common  femoral artery. 5. Progression of stenosis at the origin of the superior mesenteric artery now likely greater than 50% narrowed and potentially greater than 70% stenosed. 6. Progression of tight stenosis at the origin of a single right renal artery of approximately 50-70%. There is an early bifurcation of this vessel with component of tight stenosis in the superior branch. Evidence of collateral artery development 2 compensate for this segmental stenosis with collaterals communicating between a right L3 lumbar artery and distal renal artery supply. 7. Stable stenosis at the origin of a solitary left renal artery of approximately 50-70%. 8. Diverticulosis of the sigmoid and descending colon without evidence of diverticulitis.     Electronically Signed   By: Irish Lack M.D.   On: 06/21/2022 16:42  Assessment/Plan:  89 y.o. male with suspected appendicitis, complicated by pertinent comorbidities including open abdominal aortic aneurysm repair in 2005, coronary artery disease status post CABG 1990, cholecystectomy in 2010, femoral endarterectomy with insertion of iliac stent in 2021, left inguinal hernia repair 2019.  Patient had history of A-fib on Eliquis.  Last dose of Eliquis this morning.  Patient with physical exam and imaging consistent with acute appendicitis.  Onset of pain this morning.  White blood cell count without leukocytosis.  Due to patient's extensive abdominal surgeries and medical  comorbidities including A-fib on chronic anticoagulation I think that patient would benefit of treatment with antibiotic therapy.  Discussed with patient that if he does not respond to oral antibiotic therapy he may need appendectomy, ideally after 24 hours of holding Eliquis.  Will recommend to hold Eliquis for the next 24 to 48 hours.  Will follow-up with serial physical exam and labs.  Patient understood the risk and agreed with plan.  Does not have any close family.    Gae Gallop,  MD

## 2022-06-21 NOTE — Consult Note (Signed)
Pharmacy Antibiotic Note  [name] is a 89 y.o. male admitted on 06/21/2022 with  appendicitis .  Pharmacy has been consulted for Zosyn dosing.  Assessment: 89 y.o. male with PMH AAA s/p stenting, Afib on Eliquis, CAD who presents with RLQ pain with nausea. CTAP shows findings consistent with acute appendicitis. No evidence of focal abscess or associated extraluminal air is identified. Pt is afebrile and VSS. Pharmacy has been consulted to initiate Zosyn, and patient received one dose in the ED already.  Plan: Day #1 of antibiotics Initiate piperacillin-tazobactam 3.375 g IV q8H Monitor renal function to assess for any necessary antibiotic dosing changes  Height: 6' (182.9 cm) Weight: 72.1 kg (158 lb 15.2 oz) IBW/kg (Calculated) : 77.6  Temp (24hrs), Avg:98 F (36.7 C), Min:97.8 F (36.6 C), Max:98.1 F (36.7 C)  Recent Labs  Lab 06/21/22 1304  WBC 9.5  CREATININE 1.59*    Estimated Creatinine Clearance: 31.5 mL/min (A) (by C-G formula based on SCr of 1.59 mg/dL (H)).    Allergies  Allergen Reactions   Morphine And Codeine Nausea And Vomiting   Sulfa Antibiotics Rash    Antimicrobials this admission: Zosyn 5/28 >>   Dose adjustments this admission: N/A  Microbiology results: N/A  Thank you for allowing pharmacy to be a part of this patient's care.  Will M. Dareen Piano, PharmD PGY-1 Pharmacy Resident 06/21/2022 5:33 PM

## 2022-06-21 NOTE — Assessment & Plan Note (Signed)
Home Eliquis not resumed on admission, patient's last Eliquis dose was a.m. prior to ED presentation

## 2022-06-21 NOTE — Hospital Course (Signed)
[name] is a 89 year old male with history of hyperlipidemia, atrial fibrillation on Eliquis, hypertension, who presents emergency department for chief concerns of right lower quadrant abdominal pain and nausea  Vitals in the ED showed temperature of 98.1, respiration rate of 18, heart rate of 82, blood pressure 149/60, SpO2 of 97% on room air.  Serum sodium is 140, potassium 3.5, chloride 102, bicarb 26, BUN of 35, serum creatinine 1.59, nonfasting blood glucose 150, EGFR 41, WBC 9.5, platelets 163, hemoglobin 11.8.  UA was negative for leukocytes and nitrates.  CTA abdomen pelvis with and without contrast: Inflammation at the cecal tip and extending inferiorly over a region measuring approximately 13 mm in diameter.  Vague suggestion of an appendix measuring approximately 9 mm and surrounding fluid and inflammation.  Findings are most likely representative of acute appendicitis.  No focal abscess and associated extraluminal air is identified.  EDP discussed with general surgery who recommends conservative management with IV antibiotic at this time  ED treatment: Zosyn IV, acetaminophen 1000 mg p.o. one-time dose

## 2022-06-21 NOTE — Assessment & Plan Note (Signed)
At baseline 

## 2022-06-21 NOTE — Assessment & Plan Note (Signed)
Continue Zosyn per pharmacy General surgery, Dr. Maia Plan has been consulted by EDP and states conservative management at this time Symptomatic support: Oxycodone 5 mg every 6 hours as needed for moderate pain, 2 days ordered; morphine 2 mg IV every 4 hours as needed for severe pain, 15 hours of coverage ordered LR 150 mL/h, 1 day ordered Admit to telemetry cardiac, inpatient

## 2022-06-21 NOTE — Assessment & Plan Note (Signed)
Pending med reconciliation, patient does not remember what his antihypertensive medications name are Hydralazine 5 mg IV every 6 hours as needed for SBP greater 175, 4 days ordered

## 2022-06-22 ENCOUNTER — Encounter
Admission: EM | Disposition: A | Discharge status: Home / Self Care | Payer: Self-pay | Source: Home / Self Care | Attending: Student

## 2022-06-22 ENCOUNTER — Inpatient Hospital Stay: Payer: Medicare PPO | Admitting: Anesthesiology

## 2022-06-22 ENCOUNTER — Encounter: Payer: Self-pay | Admitting: Internal Medicine

## 2022-06-22 ENCOUNTER — Other Ambulatory Visit: Payer: Self-pay

## 2022-06-22 DIAGNOSIS — K551 Chronic vascular disorders of intestine: Secondary | ICD-10-CM | POA: Diagnosis not present

## 2022-06-22 DIAGNOSIS — I7142 Juxtarenal abdominal aortic aneurysm, without rupture: Secondary | ICD-10-CM

## 2022-06-22 DIAGNOSIS — K3580 Unspecified acute appendicitis: Secondary | ICD-10-CM

## 2022-06-22 DIAGNOSIS — I701 Atherosclerosis of renal artery: Secondary | ICD-10-CM | POA: Diagnosis not present

## 2022-06-22 DIAGNOSIS — K5730 Diverticulosis of large intestine without perforation or abscess without bleeding: Secondary | ICD-10-CM

## 2022-06-22 DIAGNOSIS — Z7189 Other specified counseling: Secondary | ICD-10-CM

## 2022-06-22 DIAGNOSIS — I70202 Unspecified atherosclerosis of native arteries of extremities, left leg: Secondary | ICD-10-CM | POA: Diagnosis not present

## 2022-06-22 DIAGNOSIS — Z95828 Presence of other vascular implants and grafts: Secondary | ICD-10-CM

## 2022-06-22 DIAGNOSIS — Z9889 Other specified postprocedural states: Secondary | ICD-10-CM

## 2022-06-22 DIAGNOSIS — K353 Acute appendicitis with localized peritonitis, without perforation or gangrene: Secondary | ICD-10-CM | POA: Diagnosis not present

## 2022-06-22 HISTORY — PX: XI ROBOTIC LAPAROSCOPIC ASSISTED APPENDECTOMY: SHX6877

## 2022-06-22 LAB — CBC
HCT: 33.3 % — ABNORMAL LOW (ref 39.0–52.0)
Hemoglobin: 10.7 g/dL — ABNORMAL LOW (ref 13.0–17.0)
MCH: 30.7 pg (ref 26.0–34.0)
MCHC: 32.1 g/dL (ref 30.0–36.0)
MCV: 95.4 fL (ref 80.0–100.0)
Platelets: 148 10*3/uL — ABNORMAL LOW (ref 150–400)
RBC: 3.49 MIL/uL — ABNORMAL LOW (ref 4.22–5.81)
RDW: 14.3 % (ref 11.5–15.5)
WBC: 9.9 10*3/uL (ref 4.0–10.5)
nRBC: 0 % (ref 0.0–0.2)

## 2022-06-22 LAB — BASIC METABOLIC PANEL
Anion gap: 9 (ref 5–15)
BUN: 28 mg/dL — ABNORMAL HIGH (ref 8–23)
CO2: 29 mmol/L (ref 22–32)
Calcium: 9 mg/dL (ref 8.9–10.3)
Chloride: 101 mmol/L (ref 98–111)
Creatinine, Ser: 1.39 mg/dL — ABNORMAL HIGH (ref 0.61–1.24)
GFR, Estimated: 48 mL/min — ABNORMAL LOW (ref >60)
Glucose, Bld: 141 mg/dL — ABNORMAL HIGH (ref 70–99)
Potassium: 3.5 mmol/L (ref 3.5–5.1)
Sodium: 139 mmol/L (ref 135–145)

## 2022-06-22 LAB — CBG MONITORING, ED
Glucose-Capillary: 117 mg/dL — ABNORMAL HIGH (ref 70–99)
Glucose-Capillary: 141 mg/dL — ABNORMAL HIGH (ref 70–99)

## 2022-06-22 LAB — MAGNESIUM: Magnesium: 1.4 mg/dL — ABNORMAL LOW (ref 1.7–2.4)

## 2022-06-22 LAB — SURGICAL PCR SCREEN
MRSA, PCR: NEGATIVE
Staphylococcus aureus: NEGATIVE

## 2022-06-22 LAB — PHOSPHORUS: Phosphorus: 2.2 mg/dL — ABNORMAL LOW (ref 2.5–4.6)

## 2022-06-22 SURGERY — APPENDECTOMY, ROBOT-ASSISTED, LAPAROSCOPIC
Anesthesia: General

## 2022-06-22 MED ORDER — FENTANYL CITRATE (PF) 100 MCG/2ML IJ SOLN: 25.0000 ug | INTRAMUSCULAR | Status: DC | PRN

## 2022-06-22 MED ORDER — LIDOCAINE HCL (CARDIAC) PF 100 MG/5ML IV SOSY
PREFILLED_SYRINGE | INTRAVENOUS | Status: DC | PRN
  Administered 2022-06-22: 100 mg via INTRAVENOUS

## 2022-06-22 MED ORDER — ACETAMINOPHEN 10 MG/ML IV SOLN
INTRAVENOUS | Status: AC
  Filled 2022-06-22: qty 100

## 2022-06-22 MED ORDER — PROPOFOL 10 MG/ML IV BOLUS
INTRAVENOUS | Status: DC | PRN
  Administered 2022-06-22: 90 mg via INTRAVENOUS

## 2022-06-22 MED ORDER — LIDOCAINE HCL (PF) 2 % IJ SOLN
INTRAMUSCULAR | Status: AC
  Filled 2022-06-22: qty 5

## 2022-06-22 MED ORDER — SUCCINYLCHOLINE CHLORIDE 200 MG/10ML IV SOSY
PREFILLED_SYRINGE | INTRAVENOUS | Status: DC | PRN
  Administered 2022-06-22: 100 mg via INTRAVENOUS

## 2022-06-22 MED ORDER — CEFAZOLIN SODIUM-DEXTROSE 2-3 GM-%(50ML) IV SOLR
INTRAVENOUS | Status: DC | PRN
  Administered 2022-06-22: 2 g via INTRAVENOUS

## 2022-06-22 MED ORDER — PHENYLEPHRINE 80 MCG/ML (10ML) SYRINGE FOR IV PUSH (FOR BLOOD PRESSURE SUPPORT)
PREFILLED_SYRINGE | INTRAVENOUS | Status: AC
  Filled 2022-06-22: qty 10

## 2022-06-22 MED ORDER — OXYCODONE HCL 5 MG/5ML PO SOLN: 5.0000 mg | Freq: Once | ORAL | Status: DC | PRN

## 2022-06-22 MED ORDER — KETOROLAC TROMETHAMINE 30 MG/ML IJ SOLN
INTRAMUSCULAR | Status: AC
  Filled 2022-06-22: qty 1

## 2022-06-22 MED ORDER — EPHEDRINE 5 MG/ML INJ
INTRAVENOUS | Status: AC
  Filled 2022-06-22: qty 5

## 2022-06-22 MED ORDER — PHENYLEPHRINE 80 MCG/ML (10ML) SYRINGE FOR IV PUSH (FOR BLOOD PRESSURE SUPPORT)
PREFILLED_SYRINGE | INTRAVENOUS | Status: DC | PRN
  Administered 2022-06-22: 120 ug via INTRAVENOUS
  Administered 2022-06-22: 80 ug via INTRAVENOUS

## 2022-06-22 MED ORDER — DEXAMETHASONE SODIUM PHOSPHATE 10 MG/ML IJ SOLN
INTRAMUSCULAR | Status: DC | PRN
  Administered 2022-06-22: 5 mg via INTRAVENOUS

## 2022-06-22 MED ORDER — ACETAMINOPHEN 10 MG/ML IV SOLN
INTRAVENOUS | Status: DC | PRN
  Administered 2022-06-22: 1000 mg via INTRAVENOUS

## 2022-06-22 MED ORDER — ROCURONIUM BROMIDE 100 MG/10ML IV SOLN
INTRAVENOUS | Status: DC | PRN
  Administered 2022-06-22: 30 mg via INTRAVENOUS
  Administered 2022-06-22 (×2): 10 mg via INTRAVENOUS

## 2022-06-22 MED ORDER — FENTANYL CITRATE (PF) 100 MCG/2ML IJ SOLN
INTRAMUSCULAR | Status: AC
  Filled 2022-06-22: qty 2

## 2022-06-22 MED ORDER — BUPIVACAINE-EPINEPHRINE (PF) 0.5% -1:200000 IJ SOLN
INTRAMUSCULAR | Status: DC | PRN
  Administered 2022-06-22: 15 mL via PERINEURAL

## 2022-06-22 MED ORDER — ONDANSETRON HCL 4 MG/2ML IJ SOLN
INTRAMUSCULAR | Status: AC
  Filled 2022-06-22: qty 2

## 2022-06-22 MED ORDER — EPHEDRINE SULFATE (PRESSORS) 50 MG/ML IJ SOLN
INTRAMUSCULAR | Status: DC | PRN
  Administered 2022-06-22: 5 mg via INTRAVENOUS
  Administered 2022-06-22: 7.5 mg via INTRAVENOUS

## 2022-06-22 MED ORDER — PROPOFOL 10 MG/ML IV BOLUS
INTRAVENOUS | Status: AC
  Filled 2022-06-22: qty 20

## 2022-06-22 MED ORDER — MORPHINE SULFATE (PF) 2 MG/ML IV SOLN: 2.0000 mg | Freq: Four times a day (QID) | INTRAVENOUS | Status: DC | PRN

## 2022-06-22 MED ORDER — SUGAMMADEX SODIUM 200 MG/2ML IV SOLN
INTRAVENOUS | Status: DC | PRN
  Administered 2022-06-22: 200 mg via INTRAVENOUS

## 2022-06-22 MED ORDER — CEFAZOLIN SODIUM 1 G IJ SOLR
INTRAMUSCULAR | Status: AC
  Filled 2022-06-22: qty 20

## 2022-06-22 MED ORDER — OXYCODONE HCL 5 MG PO TABS: 5.0000 mg | ORAL_TABLET | Freq: Once | ORAL | Status: DC | PRN

## 2022-06-22 MED ORDER — ROCURONIUM BROMIDE 10 MG/ML (PF) SYRINGE
PREFILLED_SYRINGE | INTRAVENOUS | Status: AC
  Filled 2022-06-22: qty 10

## 2022-06-22 MED ORDER — DEXAMETHASONE SODIUM PHOSPHATE 10 MG/ML IJ SOLN
INTRAMUSCULAR | Status: AC
  Filled 2022-06-22: qty 1

## 2022-06-22 MED ORDER — FENTANYL CITRATE (PF) 100 MCG/2ML IJ SOLN
INTRAMUSCULAR | Status: DC | PRN
  Administered 2022-06-22 (×2): 25 ug via INTRAVENOUS
  Administered 2022-06-22: 12.5 ug via INTRAVENOUS

## 2022-06-22 MED ORDER — BUPIVACAINE HCL (PF) 0.5 % IJ SOLN
INTRAMUSCULAR | Status: AC
  Filled 2022-06-22: qty 30

## 2022-06-22 MED ORDER — "VISTASEAL 4 ML SINGLE DOSE KIT "
PACK | CUTANEOUS | Status: DC | PRN
  Administered 2022-06-22: 4 mL via TOPICAL

## 2022-06-22 MED ORDER — EPINEPHRINE PF 1 MG/ML IJ SOLN
INTRAMUSCULAR | Status: AC
  Filled 2022-06-22: qty 1

## 2022-06-22 MED ORDER — ONDANSETRON HCL 4 MG/2ML IJ SOLN
INTRAMUSCULAR | Status: DC | PRN
  Administered 2022-06-22: 4 mg via INTRAVENOUS

## 2022-06-22 MED ORDER — LACTATED RINGERS IV SOLN: INTRAVENOUS | Status: DC

## 2022-06-22 MED ORDER — "VISTASEAL 4 ML SINGLE DOSE KIT "
PACK | CUTANEOUS | Status: AC
  Filled 2022-06-22: qty 4

## 2022-06-22 SURGICAL SUPPLY — 61 items
ADH SKN CLS APL DERMABOND .7 (GAUZE/BANDAGES/DRESSINGS) ×1
APL LAPSCP 35 DL APL RGD (MISCELLANEOUS) ×1
APPLICATOR VISTASEAL 35 (MISCELLANEOUS) IMPLANT
BAG PRESSURE INF REUSE 1000 (BAG) IMPLANT
BLADE SURG SZ11 CARB STEEL (BLADE) ×2 IMPLANT
CANNULA REDUCER 12-8 DVNC XI (CANNULA) ×2 IMPLANT
COVER TIP SHEARS 8 DVNC (MISCELLANEOUS) ×2 IMPLANT
DERMABOND ADVANCED .7 DNX12 (GAUZE/BANDAGES/DRESSINGS) ×2 IMPLANT
DRAPE ARM DVNC X/XI (DISPOSABLE) ×8 IMPLANT
DRAPE COLUMN DVNC XI (DISPOSABLE) ×2 IMPLANT
ELECT REM PT RETURN 9FT ADLT (ELECTROSURGICAL) ×1
ELECTRODE REM PT RTRN 9FT ADLT (ELECTROSURGICAL) ×2 IMPLANT
FORCEPS BPLR FENES DVNC XI (FORCEP) ×2 IMPLANT
GLOVE BIOGEL PI IND STRL 6.5 (GLOVE) ×4 IMPLANT
GLOVE SURG SYN 6.5 ES PF (GLOVE) ×3 IMPLANT
GLOVE SURG SYN 6.5 PF PI (GLOVE) ×4 IMPLANT
GOWN STRL REUS W/ TWL LRG LVL3 (GOWN DISPOSABLE) ×6 IMPLANT
GOWN STRL REUS W/TWL LRG LVL3 (GOWN DISPOSABLE) ×3
GRASPER SUT TROCAR 14GX15 (MISCELLANEOUS) IMPLANT
GRASPER TIP-UP FEN DVNC XI (INSTRUMENTS) ×2 IMPLANT
IRRIGATOR SUCT 8 DISP DVNC XI (IRRIGATION / IRRIGATOR) IMPLANT
IV NS 1000ML (IV SOLUTION)
IV NS 1000ML BAXH (IV SOLUTION) IMPLANT
KIT PINK PAD W/HEAD ARE REST (MISCELLANEOUS) ×1 IMPLANT
KIT PINK PAD W/HEAD ARM REST (MISCELLANEOUS) ×2 IMPLANT
LABEL OR SOLS (LABEL) IMPLANT
MANIFOLD NEPTUNE II (INSTRUMENTS) ×2 IMPLANT
NDL DRIVE SUT CUT DVNC (INSTRUMENTS) ×2 IMPLANT
NDL HYPO 22X1.5 SAFETY MO (MISCELLANEOUS) ×2 IMPLANT
NDL INSUFFLATION 14GA 120MM (NEEDLE) ×2 IMPLANT
NEEDLE DRIVE SUT CUT DVNC (INSTRUMENTS) ×1 IMPLANT
NEEDLE HYPO 22X1.5 SAFETY MO (MISCELLANEOUS) ×1 IMPLANT
NEEDLE INSUFFLATION 14GA 120MM (NEEDLE) ×1 IMPLANT
OBTURATOR OPTICAL STND 8 DVNC (TROCAR) ×1
OBTURATOR OPTICALSTD 8 DVNC (TROCAR) ×2 IMPLANT
PACK LAP CHOLECYSTECTOMY (MISCELLANEOUS) ×2 IMPLANT
RELOAD STAPLE 45 2.5 WHT DVNC (STAPLE) IMPLANT
RELOAD STAPLE 45 3.5 BLU DVNC (STAPLE) IMPLANT
RELOAD STAPLER 2.5X45 WHT DVNC (STAPLE) IMPLANT
RELOAD STAPLER 3.5X45 BLU DVNC (STAPLE) IMPLANT
SCISSORS MNPLR CVD DVNC XI (INSTRUMENTS) ×2 IMPLANT
SEAL UNIV 5-12 XI (MISCELLANEOUS) ×8 IMPLANT
SEALER VESSEL EXT DVNC XI (MISCELLANEOUS) IMPLANT
SET TUBE SMOKE EVAC HIGH FLOW (TUBING) ×2 IMPLANT
SOL ELECTROSURG ANTI STICK (MISCELLANEOUS) ×1
SOLUTION ELECTROSURG ANTI STCK (MISCELLANEOUS) ×2 IMPLANT
SPONGE T-LAP 4X18 ~~LOC~~+RFID (SPONGE) ×2 IMPLANT
STAPLER 45 SUREFORM DVNC (STAPLE) IMPLANT
STAPLER RELOAD 2.5X45 WHT DVNC (STAPLE)
STAPLER RELOAD 3.5X45 BLU DVNC (STAPLE)
SUT MNCRL AB 4-0 PS2 18 (SUTURE) ×2 IMPLANT
SUT VIC AB 2-0 SH 27 (SUTURE)
SUT VIC AB 2-0 SH 27XBRD (SUTURE) ×2 IMPLANT
SUT VICRYL 0 UR6 27IN ABS (SUTURE) ×2 IMPLANT
SUT VLOC 90 6 CV-15 VIOLET (SUTURE) ×2 IMPLANT
SYR 30ML LL (SYRINGE) ×2 IMPLANT
SYS BAG RETRIEVAL 10MM (BASKET) ×1
SYSTEM BAG RETRIEVAL 10MM (BASKET) ×2 IMPLANT
TRAP FLUID SMOKE EVACUATOR (MISCELLANEOUS) ×2 IMPLANT
TRAY FOLEY MTR SLVR 16FR STAT (SET/KITS/TRAYS/PACK) IMPLANT
WATER STERILE IRR 500ML POUR (IV SOLUTION) ×2 IMPLANT

## 2022-06-22 NOTE — Progress Notes (Signed)
PT Cancellation Note  Patient Details Name: [name] MRN: [id number] DOB: 10/04/1930   Cancelled Treatment:    Reason Eval/Treat Not Completed: Other (comment). Pt off the floor for surgery, PT to sign off, please re-consult when patient is medically appropriate.    Olga Coaster PT, DPT 2:16 PM,06/22/22

## 2022-06-22 NOTE — Consult Note (Signed)
Hospital Consult    Reason for Consult:  Multiple Vascular Issues found on CT Scan Requesting Physician:  Dr Artis Delay MD MRN #:  [id number]  History of Present Illness: This is a 89 y.o. male  presented to [facility] ED for evaluation of abdominal pain. Patient reports he started having lower abdominal pain earlier this morning.  Then pain radiated to the right lower quadrant.  Pain is aggravated by palpating the right lower quadrant.  No alleviating factors.  Patient endorses nausea.   On exam this morning patient is resting comfortably in bed.  He does endorse abdominal pain but says some of the pain medicine he has gotten has helped.  He endorses he has no other complaints about his extremities.  Patient denies any pain to bilateral lower extremities either at rest or upon walking.  Past Medical History:  Diagnosis Date   AAA (abdominal aortic aneurysm) (HCC)    Actinic keratosis    Arthritis    in fingers and hands   Basal cell carcinoma 09/01/2021   Right medial canthus/lower eyelid. Nodular BCC. Excixed 09/01/2021   Cancer (HCC)    skin cancer removed several   Diabetes mellitus without complication (HCC)    Dysrhythmia    atrial fibrillation.  on eliquis   History of kidney stones    Hx of basal cell carcinoma 06/30/2016   L proximal mandible   Hx of basal cell carcinoma 07/19/2016   L medial lower eyelid   Hx of basal cell carcinoma 09/20/2016   R upper back lateral   Hx of basal cell carcinoma 12/01/2016   L post shoulder sup medial scapula   Hx of basal cell carcinoma 12/01/2016   L forehead, 2.0cm above med brow   Hx of basal cell carcinoma 12/17/2018   L forehead aboove medial brow   Hx of melanoma in situ 08/16/2016   L proximal mandible/infra auricular   Hypertension    Mitral insufficiency    per dr. Juliann Pares   Myocardial infarct [facility]) 1990   Squamous cell carcinoma of skin 11/01/2017   R superior sideburn in hairline   Squamous cell carcinoma of skin  01/03/2018   L medial dorsum wrist lateral and medial   Squamous cell carcinoma of skin 01/03/2018   L proximal thumb   Squamous cell carcinoma of skin 04/12/2018   L dorsum proximal index finger   Squamous cell carcinoma of skin 04/12/2018   L dorsum medial proximal thumb   Squamous cell carcinoma of skin 07/04/2018   R superior sideburn   Squamous cell carcinoma of skin 07/04/2018   L dorsum proximal index finger   Squamous cell carcinoma of skin 11/13/2018   R helix   Squamous cell carcinoma of skin 03/05/2020   scalp   Transfusion history 1990   received several units of blood after CABG    Past Surgical History:  Procedure Laterality Date   ABDOMINAL AORTIC ANEURYSM REPAIR  2005, [facility]   Cardiac bypass  1990   [facility]. triple cabg   CARDIAC CATHETERIZATION  1990   stents placed but ultimately clotted and shut down.   CARDIAC SURGERY  1990   duplicate entry   CHOLECYSTECTOMY N/A 11/10/2008   Dr Lemar Livings   CORONARY ARTERY BYPASS GRAFT  1990   x 3. performed after coronary stents failed   ENDARTERECTOMY FEMORAL Right 05/29/2019   Procedure: ENDARTERECTOMY FEMORAL;  Surgeon: Annice Needy, MD;  Location: [facility] ORS;  Service: Vascular;  Laterality: Right;   EYE  SURGERY Bilateral    cataract extractions with iol   INGUINAL HERNIA REPAIR Left 08/07/2017   Medium Ultra Pro mesh Surgeon: Earline Mayotte, MD;  Location: [facility] ORS;  Service: General;  Laterality: Left;   INSERTION OF ILIAC STENT Right 05/29/2019   Procedure: INSERTION OF ILIAC STENT ( AND SFA STENT);  Surgeon: Annice Needy, MD;  Location: [facility] ORS;  Service: Vascular;  Laterality: Right;   LOWER EXTREMITY ANGIOGRAPHY Right 05/27/2019   Procedure: LOWER EXTREMITY ANGIOGRAPHY;  Surgeon: Annice Needy, MD;  Location: [facility] INVASIVE CV LAB;  Service: Cardiovascular;  Laterality: Right;    Allergies  Allergen Reactions   Morphine And Codeine Nausea And Vomiting   Sulfa Antibiotics Rash    Prior to Admission medications    Medication Sig Start Date End Date Taking? Authorizing Provider  acetaminophen (TYLENOL) 325 MG tablet Take 2 tablets (650 mg total) by mouth every 6 (six) hours as needed for mild pain or fever. 05/30/19  Yes Stegmayer, Ranae Plumber, PA-C  atorvastatin (LIPITOR) 40 MG tablet Take 40 mg by mouth daily. 06/26/14  Yes [provider]  Cholecalciferol (VITAMIN D3) 5000 units CAPS Take 5,000 Units by mouth daily.   Yes [provider]  diltiazem (CARDIZEM CD) 180 MG 24 hr capsule Take 180 mg by mouth daily. 08/31/20  Yes [provider]  ELIQUIS 5 MG TABS tablet Take 1 tablet (5 mg total) by mouth 2 (two) times daily. 08/11/17  Yes Byrnett, Merrily Pew, MD  lisinopril-hydrochlorothiazide (PRINZIDE,ZESTORETIC) 20-12.5 MG tablet Take 1 tablet by mouth 2 (two) times daily.  02/26/15  Yes [provider]  tetrahydrozoline (GOODSENSE EYE DROPS) 0.05 % ophthalmic solution Place 1 drop into both eyes daily as needed (dry eyes).   Yes [provider]  fluorouracil (EFUDEX) 5 % cream Apply topically 2 (two) times daily. Starting on 12/17/2021 apply to forehead up into hairline and temples bid for 7 days Patient not taking: Reported on 06/21/2022 12/02/21   Deirdre Evener, MD  glucose blood (BAYER CONTOUR TEST) test strip 1 strip by Does not apply route 3 (three) times daily. 03/14/14   [provider]  Omega-3 Fatty Acids (FISH OIL) 1000 MG CAPS Take 1,000 mg by mouth daily.  Patient not taking: Reported on 06/21/2022    [provider]  predniSONE (DELTASONE) 10 MG tablet Take 1 tablet (10 mg total) by mouth daily. 6,5,4,3,2,1 six day taper Patient not taking: Reported on 06/21/2022 09/08/20   Evon Slack, PA-C  vitamin B-12 (CYANOCOBALAMIN) 1000 MCG tablet Take 1,000 mcg by mouth 2 (two) times a week. Patient not taking: Reported on 06/21/2022    [provider]    Social History   Socioeconomic History   Marital status: Widowed    Spouse  name: Not on file   Number of children: Not on file   Years of education: Not on file   Highest education level: Not on file  Occupational History   Not on file  Tobacco Use   Smoking status: Former    Years: 30    Types: Cigarettes    Quit date: 01/25/1988    Years since quitting: 34.4   Smokeless tobacco: Never  Vaping Use   Vaping Use: Never used  Substance and Sexual Activity   Alcohol use: No   Drug use: No   Sexual activity: Not Currently  Other Topics Concern   Not on file  Social History Narrative   Not on file  Social Determinants of Health   Financial Resource Strain: Not on file  Food Insecurity: No Food Insecurity (06/22/2022)   Hunger Vital Sign    Worried About Running Out of Food in the Last Year: Never true    Ran Out of Food in the Last Year: Never true  Transportation Needs: No Transportation Needs (06/22/2022)   PRAPARE - Administrator, Civil Service (Medical): No    Lack of Transportation (Non-Medical): No  Physical Activity: Not on file  Stress: Not on file  Social Connections: Not on file  Intimate Partner Violence: Not At Risk (06/22/2022)   Humiliation, Afraid, Rape, and Kick questionnaire    Fear of Current or Ex-Partner: No    Emotionally Abused: No    Physically Abused: No    Sexually Abused: No     Family History  Problem Relation Age of Onset   Cerebral aneurysm Mother        died at 65   CAD Father        died at 110    ROS: Otherwise negative unless mentioned in HPI  Physical Examination  Vitals:   06/22/22 0600 06/22/22 0800  BP: (!) 129/55   Pulse: 78   Resp: 16   Temp: 98.2 F (36.8 C) 98.8 F (37.1 C)  SpO2: 96%    Body mass index is 21.56 kg/m.  General:  WDWN in NAD Gait: Not observed HENT: WNL, normocephalic Pulmonary: normal non-labored breathing, without Rales, rhonchi,  wheezing Cardiac: regular, without  Murmurs, rubs or gallops; without carotid bruits Abdomen: Positive Bowel sounds, soft,  Positive Tenderness /ND, no masses Skin: without rashes Vascular Exam/Pulses: Non palpable bilateral lower extremity pulses.  Extremities: without ischemic changes, without Gangrene , without cellulitis; without open wounds;  Musculoskeletal: no muscle wasting or atrophy  Neurologic: A&O X 3;  No focal weakness or paresthesias are detected; speech is fluent/normal Psychiatric:  The pt has Normal affect. Lymph:  Unremarkable  CBC    Component Value Date/Time   WBC 9.9 06/22/2022 0622   RBC 3.49 (L) 06/22/2022 0622   HGB 10.7 (L) 06/22/2022 0622   HGB WILL FOLLOW 03/18/2015 1115   HGB 13.3 03/18/2015 1115   HCT 33.3 (L) 06/22/2022 0622   HCT WILL FOLLOW 03/18/2015 1115   HCT 39.7 03/18/2015 1115   PLT 148 (L) 06/22/2022 0622   PLT WILL FOLLOW 03/18/2015 1115   PLT 161 03/18/2015 1115   MCV 95.4 06/22/2022 0622   MCV WILL FOLLOW 03/18/2015 1115   MCV 88 03/18/2015 1115   MCV 89 12/20/2011 0056   MCH 30.7 06/22/2022 0622   MCHC 32.1 06/22/2022 0622   RDW 14.3 06/22/2022 0622   RDW WILL FOLLOW 03/18/2015 1115   RDW 14.8 03/18/2015 1115   RDW 14.1 12/20/2011 0056   LYMPHSABS 1.8 05/20/2016 2118   LYMPHSABS WILL FOLLOW 03/18/2015 1115   LYMPHSABS 1.5 03/18/2015 1115   MONOABS 0.7 05/20/2016 2118   EOSABS 0.2 05/20/2016 2118   EOSABS WILL FOLLOW 03/18/2015 1115   EOSABS 0.1 03/18/2015 1115   BASOSABS 0.0 05/20/2016 2118   BASOSABS WILL FOLLOW 03/18/2015 1115   BASOSABS 0.0 03/18/2015 1115    BMET    Component Value Date/Time   NA 139 06/22/2022 0622   NA 141 03/18/2015 1115   NA 144 12/20/2011 0056   K 3.5 06/22/2022 0622   K 3.3 (L) 12/20/2011 0056   CL 101 06/22/2022 0622   CL 108 (H) 12/20/2011 0056   CO2  29 06/22/2022 0622   CO2 27 12/20/2011 0056   GLUCOSE 141 (H) 06/22/2022 0622   GLUCOSE 158 (H) 12/20/2011 0056   BUN 28 (H) 06/22/2022 0622   BUN 28 (H) 03/18/2015 1115   BUN 25 (H) 12/20/2011 0056   CREATININE 1.39 (H) 06/22/2022 0622   CREATININE 1.54 (H)  12/20/2011 0056   CALCIUM 9.0 06/22/2022 0622   CALCIUM 8.8 12/20/2011 0056   GFRNONAA 48 (L) 06/22/2022 0622   GFRNONAA 42 (L) 12/20/2011 0056   GFRAA 45 (L) 05/30/2019 0750   GFRAA 49 (L) 12/20/2011 0056    COAGS: Lab Results  Component Value Date   INR 1.1 05/29/2019   INR 1.0 12/20/2011     Non-Invasive Vascular Imaging:   CLINICAL DATA:  Right lower quadrant abdominal pain, nausea and history of abdominal aortic aneurysm. Prior right common iliac artery stenting extending into the distal aspect of an aortic tube graft.   EXAM:06/21/2022 CT ANGIOGRAPHY ABDOMEN AND PELVIS WITH CONTRAST   TECHNIQUE: Multidetector CT imaging of the abdomen and pelvis was performed using the standard protocol during bolus administration of intravenous contrast. Multiplanar reconstructed images and MIPs were obtained and reviewed to evaluate the vascular anatomy.   RADIATION DOSE REDUCTION: This exam was performed according to the departmental dose-optimization program which includes automated exposure control, adjustment of the mA and/or kV according to patient size and/or use of iterative reconstruction technique.   CONTRAST:  80mL OMNIPAQUE IOHEXOL 350 MG/ML SOLN   COMPARISON:  Prior CTA of the abdomen and pelvis on 06/29/2015   FINDINGS: VASCULAR   Aorta: Beginning immediately below the level of the renal arteries, residual aneurysmal disease of the native aorta measures up to approximately 4.5 x 4.6 cm. This segment is slightly larger compared to 2017 but fairly similar in diameter with increased opacification of the entire lumen compared to prior to aortic repair. A tube graft is now present extending from the infrarenal segment to just above the bifurcation. This graft measures approximately 3 cm in diameter and demonstrates a mild amount of internal mural thrombus. No evidence of aortic rupture or dissection. Significant atherosclerosis and mild aneurysmal disease of the  visualized lower descending thoracic aorta demonstrating calcified plaque and irregular mural thrombus. Maximal caliber is approximately 3.3 cm.   Celiac: Mild stenosis at the origin of the celiac axis of 30-40%.   SMA: There is some probable progression of stenosis at the origin of the superior mesenteric artery now likely greater than 50% narrowed and potentially greater than 70% stenosed.   Renals: Origin stenosis of a single right renal artery likely progressive and approximately 50-70%. There is an early bifurcation of this vessel with component of tight stenosis in the superior branch. There is evidence of collateralization due to this tight stenosis with a tortuous network of collaterals extending inferiorly just behind the IVC and eventually demonstrating anastomosis between renal artery supply and an enlarged right L3 lumbar artery. Stable stenosis at the origin of a solitary left renal artery of approximately 50-70%.   IMA: Occluded origin and reconstituted distal branches by SMA collaterals.   Inflow: Right-sided iliac stenting with a common iliac stent extending just into the distal aspect of the aortic tube graft and additional overlapping stents extending into the mid external iliac artery. Stented segments appear widely patent.   Chronic occlusion of the left common, internal and external iliac arteries.   Proximal Outflow: The right common femoral artery is normally patent as is the femoral bifurcation. A proximal SFA  stent is partially visualized. The left common femoral artery is chronically occluded.   Veins: Venous phase imaging demonstrates normally patent venous structures in the abdomen and pelvis.   Review of the MIP images confirms the above findings.  IMPRESSION: 1. Inflammation at the cecal tip and extending inferiorly over a region measuring approximately 13 mm in diameter. There is vague suggestion of an appendix measuring approximately 9 mm  and surrounding fluid and inflammation. Findings are most likely representative of acute appendicitis. No focal abscess or associated extraluminal air is identified. 2. Evidence of prior aortic tube graft repair with residual aneurysmal disease of the juxta-renal native aorta measuring up to 4.5 x 4.6 cm. This segment is slightly larger compared to 2017 but fairly similar in diameter with increased opacification of the entire lumen compared to the prior CTA. 3. Chronic occlusion of the left common, internal and external iliac arteries. 4. 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 5. Progression of stenosis at the origin of the superior mesenteric artery now likely greater than 50% narrowed and potentially greater than 70% stenosed. 6. Progression of tight stenosis at the origin of a single right renal artery of approximately 50-70%. There is an early bifurcation of this vessel with component of tight stenosis in the superior branch. Evidence of collateral artery development 2 compensate for this segmental stenosis with collaterals communicating between a right L3 lumbar artery and distal renal artery supply. 7. Stable stenosis at the origin of a solitary left renal artery of approximately 50-70%. 8. Diverticulosis of the sigmoid and descending colon without evidence of diverticulitis.  Statin:  Yes.   Beta Blocker:  No. Aspirin:  No. ACEI:  Yes.   ARB:  No. CCB use:  Yes Other antiplatelets/anticoagulants:  No.    ASSESSMENT/PLAN: This is a 89 y.o. male who presents to [facility] emergency department with right lower quadrant abdominal pain.  Upon further workup patient was noted to have acute appendicitis.  Decision was made to treat with antibiotics for 24 to 48 hours due to the patient not having an elevated white count.  If appendicitis persists or patient develops leukocytosis patient may undergo surgery at that point in time.  PLAN: As listed in the above impression  from the CT angio of the abdomen and pelvis, the vascular issues noted are all chronic and well-documented. Currently the patient is not complaining of any vascular issues.  Vascular surgery recommends no acute intervention at this time.  Patient will need to recover from his current acute appendicitis.  If patient wishes to be seen by vascular surgery we can schedule an appointment for him to follow-up in clinic 2 to 3 weeks after discharge from the hospital.   -I discussed the plan in detail with Dr. Festus Barren MD and he is in agreement with the plan   Marcie Bal [facility] 06/22/2022 12:22 PM

## 2022-06-22 NOTE — Anesthesia Procedure Notes (Signed)
Procedure Name: Intubation Date/Time: 06/22/2022 3:04 PM  Performed by: Lynden Oxford, CRNAPre-anesthesia Checklist: Patient identified, Emergency Drugs available, Suction available and Patient being monitored Patient Re-evaluated:Patient Re-evaluated prior to induction Oxygen Delivery Method: Circle system utilized Preoxygenation: Pre-oxygenation with 100% oxygen Induction Type: IV induction Ventilation: Mask ventilation without difficulty Laryngoscope Size: McGraph and 4 Grade View: Grade I Tube type: Oral Tube size: 7.0 mm Number of attempts: 1 Airway Equipment and Method: Stylet, Oral airway and Video-laryngoscopy Placement Confirmation: ETT inserted through vocal cords under direct vision, positive ETCO2 and breath sounds checked- equal and bilateral Secured at: 21 cm Tube secured with: Tape Dental Injury: Teeth and Oropharynx as per pre-operative assessment

## 2022-06-22 NOTE — Transfer of Care (Signed)
Immediate Anesthesia Transfer of Care Note  Patient: [name]  Procedure(s) Performed: XI ROBOTIC LAPAROSCOPIC ASSISTED APPENDECTOMY  Patient Location: PACU  Anesthesia Type:General  Level of Consciousness: drowsy and patient cooperative  Airway & Oxygen Therapy: Patient Spontanous Breathing and Patient connected to face mask oxygen  Post-op Assessment: Report given to RN and Post -op Vital signs reviewed and stable  Post vital signs: Reviewed and stable  Last Vitals:  Vitals Value Taken Time  BP 127/85 06/22/22 1635  Temp    Pulse 89 06/22/22 1640  Resp 23 06/22/22 1640  SpO2 93 % 06/22/22 1640  Vitals shown include unvalidated device data.  Last Pain:  Vitals:   06/22/22 1402  TempSrc: Temporal  PainSc: 0-No pain         Complications: No notable events documented.

## 2022-06-22 NOTE — Anesthesia Postprocedure Evaluation (Signed)
Anesthesia Post Note  Patient: [name]  Procedure(s) Performed: XI ROBOTIC LAPAROSCOPIC ASSISTED APPENDECTOMY  Patient location during evaluation: PACU Anesthesia Type: General Level of consciousness: awake and alert Pain management: pain level controlled Vital Signs Assessment: post-procedure vital signs reviewed and stable Respiratory status: spontaneous breathing, nonlabored ventilation, respiratory function stable and patient connected to nasal cannula oxygen Cardiovascular status: blood pressure returned to baseline and stable Postop Assessment: no apparent nausea or vomiting Anesthetic complications: no  No notable events documented.   Last Vitals:  Vitals:   06/22/22 1402 06/22/22 1637  BP: (!) 146/73 127/85  Pulse: 93 77  Resp: 16 17  Temp: 37.2 C (!) 36.1 C  SpO2: 95% 93%    Last Pain:  Vitals:   06/22/22 1637  TempSrc:   PainSc: 0-No pain                 Stephanie Coup

## 2022-06-22 NOTE — Progress Notes (Signed)
OT Cancellation Note  Patient Details Name: [name] MRN: [id number] DOB: 1930/10/12   Cancelled Treatment:    Reason Eval/Treat Not Completed: Patient at procedure or test/ unavailable;Patient not medically ready. Pt off the floor for surgery, PT to sign off, please re-consult when patient is medically appropriate.  Kathie Dike, M.S. OTR/L  06/22/22, 2:23 PM  ascom [phone]

## 2022-06-22 NOTE — Anesthesia Preprocedure Evaluation (Addendum)
Anesthesia Evaluation  Patient identified by MRN, date of birth, ID band Patient awake    Reviewed: Allergy & Precautions, NPO status , Patient's Chart, lab work & pertinent test results  Airway Mallampati: III  TM Distance: <3 FB Neck ROM: full    Dental  (+) Missing   Pulmonary neg shortness of breath, former smoker   Pulmonary exam normal        Cardiovascular hypertension, + CAD, + Past MI and + Peripheral Vascular Disease  Normal cardiovascular exam+ dysrhythmias      Neuro/Psych negative neurological ROS  negative psych ROS   GI/Hepatic negative GI ROS, Neg liver ROS,neg GERD  ,,  Endo/Other  diabetes, Type 2    Renal/GU CRFRenal disease     Musculoskeletal   Abdominal   Peds  Hematology negative hematology ROS (+)   Anesthesia Other Findings Past Medical History: No date: AAA (abdominal aortic aneurysm) (HCC) No date: Actinic keratosis No date: Arthritis     Comment:  in fingers and hands 09/01/2021: Basal cell carcinoma     Comment:  Right medial canthus/lower eyelid. Nodular BCC. Excixed               09/01/2021 No date: Cancer [facility])     Comment:  skin cancer removed several No date: Diabetes mellitus without complication (HCC) No date: Dysrhythmia     Comment:  atrial fibrillation.  on eliquis No date: History of kidney stones 06/30/2016: Hx of basal cell carcinoma     Comment:  L proximal mandible 07/19/2016: Hx of basal cell carcinoma     Comment:  L medial lower eyelid 09/20/2016: Hx of basal cell carcinoma     Comment:  R upper back lateral 12/01/2016: Hx of basal cell carcinoma     Comment:  L post shoulder sup medial scapula 12/01/2016: Hx of basal cell carcinoma     Comment:  L forehead, 2.0cm above med brow 12/17/2018: Hx of basal cell carcinoma     Comment:  L forehead aboove medial brow 08/16/2016: Hx of melanoma in situ     Comment:  L proximal mandible/infra auricular No date:  Hypertension No date: Mitral insufficiency     Comment:  per dr. Juliann Pares 1990: Myocardial infarct (HCC) 11/01/2017: Squamous cell carcinoma of skin     Comment:  R superior sideburn in hairline 01/03/2018: Squamous cell carcinoma of skin     Comment:  L medial dorsum wrist lateral and medial 01/03/2018: Squamous cell carcinoma of skin     Comment:  L proximal thumb 04/12/2018: Squamous cell carcinoma of skin     Comment:  L dorsum proximal index finger 04/12/2018: Squamous cell carcinoma of skin     Comment:  L dorsum medial proximal thumb 07/04/2018: Squamous cell carcinoma of skin     Comment:  R superior sideburn 07/04/2018: Squamous cell carcinoma of skin     Comment:  L dorsum proximal index finger 11/13/2018: Squamous cell carcinoma of skin     Comment:  R helix 03/05/2020: Squamous cell carcinoma of skin     Comment:  scalp 1990: Transfusion history     Comment:  received several units of blood after CABG  Past Surgical History: 2005, [facility]: ABDOMINAL AORTIC ANEURYSM REPAIR 1990: Cardiac bypass     Comment:  [facility]. triple cabg 1990: CARDIAC CATHETERIZATION     Comment:  stents placed but ultimately clotted and shut down. 1990: CARDIAC SURGERY     Comment:  duplicate entry 11/10/2008: CHOLECYSTECTOMY; N/A  Comment:  Dr Lemar Livings 1990: CORONARY ARTERY BYPASS GRAFT     Comment:  x 3. performed after coronary stents failed 05/29/2019: ENDARTERECTOMY FEMORAL; Right     Comment:  Procedure: ENDARTERECTOMY FEMORAL;  Surgeon: Annice Needy, MD;  Location: [facility] ORS;  Service: Vascular;                Laterality: Right; No date: EYE SURGERY; Bilateral     Comment:  cataract extractions with iol 08/07/2017: INGUINAL HERNIA REPAIR; Left     Comment:  Medium Ultra Pro mesh Surgeon: Earline Mayotte, MD;                Location: [facility] ORS;  Service: General;  Laterality: Left; 05/29/2019: INSERTION OF ILIAC STENT; Right     Comment:  Procedure: INSERTION OF ILIAC  STENT ( AND SFA STENT);                Surgeon: Annice Needy, MD;  Location: [facility] ORS;  Service:              Vascular;  Laterality: Right; 05/27/2019: LOWER EXTREMITY ANGIOGRAPHY; Right     Comment:  Procedure: LOWER EXTREMITY ANGIOGRAPHY;  Surgeon: Annice Needy, MD;  Location: [facility] INVASIVE CV LAB;  Service:               Cardiovascular;  Laterality: Right;  BMI    Body Mass Index: 21.56 kg/m      Reproductive/Obstetrics negative OB ROS                             Anesthesia Physical Anesthesia Plan  ASA: 3  Anesthesia Plan: General ETT   Post-op Pain Management:    Induction: Intravenous  PONV Risk Score and Plan: Ondansetron, Dexamethasone, Midazolam and Treatment may vary due to age or medical condition  Airway Management Planned: Oral ETT  Additional Equipment:   Intra-op Plan:   Post-operative Plan: Extubation in OR  Informed Consent: I have reviewed the patients History and Physical, chart, labs and discussed the procedure including the risks, benefits and alternatives for the proposed anesthesia with the patient or authorized representative who has indicated his/her understanding and acceptance.   Patient has DNR.  Discussed DNR with patient and Suspend DNR.   Dental Advisory Given  Plan Discussed with: Anesthesiologist, CRNA and Surgeon  Anesthesia Plan Comments: (Patient consented for risks of anesthesia including but not limited to:  - adverse reactions to medications - damage to eyes, teeth, lips or other oral mucosa - nerve damage due to positioning  - sore throat or hoarseness - Damage to heart, brain, nerves, lungs, other parts of body or loss of life  Patient voiced understanding.)       Anesthesia Quick Evaluation

## 2022-06-22 NOTE — Progress Notes (Signed)
[facility] Progress Note  Patient: [name]    ZOX:[id number]  DOA: 06/21/2022     Date of Service: the patient was seen and examined on 06/22/2022  Chief Complaint  Patient presents with   Abdominal Pain   Brief hospital course: [name] is a 89 year old male with history of hyperlipidemia, atrial fibrillation on Eliquis, hypertension, who presents emergency department for chief concerns of right lower quadrant abdominal pain and nausea   Vitals in the ED showed temperature of 98.1, respiration rate of 18, heart rate of 82, blood pressure 149/60, SpO2 of 97% on room air.   Serum sodium is 140, potassium 3.5, chloride 102, bicarb 26, BUN of 35, serum creatinine 1.59, nonfasting blood glucose 150, EGFR 41, WBC 9.5, platelets 163, hemoglobin 11.8.   UA was negative for leukocytes and nitrates.   CTA abdomen pelvis with and without contrast: Inflammation at the cecal tip and extending inferiorly over a region measuring approximately 13 mm in diameter.  Vague suggestion of an appendix measuring approximately 9 mm and surrounding fluid and inflammation.  Findings are most likely representative of acute appendicitis.  No focal abscess and associated extraluminal air is identified.   EDP discussed with general surgery who recommends conservative management with IV antibiotic at this time  Assessment and Plan:  Acute appendicitis Continue Zosyn as per pharmacy General surgery consulted, scheduled for appendectomy today Continue as needed medication for pain control Continue IV fluid for hydration Keep n.p.o. for now   HTN, HLD, chronic systolic CHF, CAD s/p CABG, A-fib Continue Cardizem CD1 80 mg p.o. daily, lisinopril 20 mg p.o. daily, Lipitor 40 mg p.o. daily, hydrochlorothiazide 12.5 mg p.o. daily home medications. Held Eliquis for now due to surgical intervention Resume Eliquis when patient is cleared by general surgery Monitor BP and titrate medications  accordingly Monitor hydration status  Peripheral artery disease, left lower extremity occlusion of common iliac, internal iliac and femoral artery.  SMA stenosis, left renal stenosis, AAA Vascular surgery consulted for further recommendation Palliative care consulted due to poor prognosis, CODE STATUS was changed to DNR.   Body mass index is 21.56 kg/m.  Interventions:       Diet: NPO DVT Prophylaxis: SCDs now, resumed Eliquis when cleared by general surgery  Advance goals of care discussion: DNR  Family Communication: family was not present at bedside, at the time of interview.  The pt provided permission to discuss medical plan with the family. Opportunity was given to ask question and all questions were answered satisfactorily.   Disposition:  Pt is from home, admitted with acute appendicitis, general surgery consulted for appendectomy today, which precludes a safe discharge. Discharge to home, when cleared by general surgery..  Subjective: No significant events overnight, patient was complaining of abdominal pain 7/10 which got improved after pain medication given in the ED.  Denies any nausea vomiting, no chest pain or palpitation, no shortness of breath.  Physical Exam: General: NAD, lying comfortably Appear in no distress, affect appropriate Eyes: PERRLA ENT: Oral Mucosa Clear, moist  Neck: no JVD,  Cardiovascular: S1 and S2 Present, no Murmur,  Respiratory: good respiratory effort, Bilateral Air entry equal and Decreased, no Crackles, no wheezes Abdomen: Bowel Sound present, Soft and RLQ tenderness,  Skin: no rashes Extremities: no Pedal edema, no calf tenderness Neurologic: without any new focal findings Gait not checked due to patient safety concerns  Vitals:   06/22/22 0500 06/22/22 0600 06/22/22 0800 06/22/22 1402  BP: (!) 122/48 (!) 129/55  Marland Kitchen)  146/73  Pulse: 61 78  93  Resp:  16  16  Temp:  98.2 F (36.8 C) 98.8 F (37.1 C) 98.9 F (37.2 C)  TempSrc:   Oral Oral Temporal  SpO2: 96% 96%  95%  Weight:      Height:       No intake or output data in the 24 hours ending 06/22/22 1514 Filed Weights   06/21/22 1439  Weight: 72.1 kg    Data Reviewed: I have personally reviewed and interpreted daily labs, tele strips, imagings as discussed above. I reviewed all nursing notes, pharmacy notes, vitals, pertinent old records I have discussed plan of care as described above with RN and patient/family.  CBC: Recent Labs  Lab 06/21/22 1304 06/22/22 0622  WBC 9.5 9.9  HGB 11.8* 10.7*  HCT 36.8* 33.3*  MCV 95.8 95.4  PLT 163 148*   Basic Metabolic Panel: Recent Labs  Lab 06/21/22 1304 06/22/22 0619 06/22/22 0622  NA 140  --  139  K 3.5  --  3.5  CL 102  --  101  CO2 26  --  29  GLUCOSE 150*  --  141*  BUN 35*  --  28*  CREATININE 1.59*  --  1.39*  CALCIUM 9.6  --  9.0  MG  --  1.4*  --   PHOS  --  2.2*  --     Studies: CT Angio Abd/Pel W and/or Wo Contrast  Result Date: 06/21/2022 CLINICAL DATA:  Right lower quadrant abdominal pain, nausea and history of abdominal aortic aneurysm. Prior right common iliac artery stenting extending into the distal aspect of an aortic tube graft. EXAM: CT ANGIOGRAPHY ABDOMEN AND PELVIS WITH CONTRAST TECHNIQUE: Multidetector CT imaging of the abdomen and pelvis was performed using the standard protocol during bolus administration of intravenous contrast. Multiplanar reconstructed images and MIPs were obtained and reviewed to evaluate the vascular anatomy. RADIATION DOSE REDUCTION: This exam was performed according to the departmental dose-optimization program which includes automated exposure control, adjustment of the mA and/or kV according to patient size and/or use of iterative reconstruction technique. CONTRAST:  80mL OMNIPAQUE IOHEXOL 350 MG/ML SOLN COMPARISON:  Prior CTA of the abdomen and pelvis on 06/29/2015 FINDINGS: VASCULAR Aorta: Beginning immediately below the level of the renal arteries,  residual aneurysmal disease of the native aorta measures up to approximately 4.5 x 4.6 cm. This segment is slightly larger compared to 2017 but fairly similar in diameter with increased opacification of the entire lumen compared to prior to aortic repair. A tube graft is now present extending from the infrarenal segment to just above the bifurcation. This graft measures approximately 3 cm in diameter and demonstrates a mild amount of internal mural thrombus. No evidence of aortic rupture or dissection. Significant atherosclerosis and mild aneurysmal disease of the visualized lower descending thoracic aorta demonstrating calcified plaque and irregular mural thrombus. Maximal caliber is approximately 3.3 cm. Celiac: Mild stenosis at the origin of the celiac axis of 30-40%. SMA: There is some probable progression of stenosis at the origin of the superior mesenteric artery now likely greater than 50% narrowed and potentially greater than 70% stenosed. Renals: Origin stenosis of a single right renal artery likely progressive and approximately 50-70%. There is an early bifurcation of this vessel with component of tight stenosis in the superior branch. There is evidence of collateralization due to this tight stenosis with a tortuous network of collaterals extending inferiorly just behind the IVC and eventually demonstrating anastomosis between renal  artery supply and an enlarged right L3 lumbar artery. Stable stenosis at the origin of a solitary left renal artery of approximately 50-70%. IMA: Occluded origin and reconstituted distal branches by SMA collaterals. Inflow: Right-sided iliac stenting with a common iliac stent extending just into the distal aspect of the aortic tube graft and additional overlapping stents extending into the mid external iliac artery. Stented segments appear widely patent. Chronic occlusion of the left common, internal and external iliac arteries. Proximal Outflow: The right common femoral artery  is normally patent as is the femoral bifurcation. A proximal SFA stent is partially visualized. The left common femoral artery is chronically occluded. Veins: Venous phase imaging demonstrates normally patent venous structures in the abdomen and pelvis. Review of the MIP images confirms the above findings. NON-VASCULAR Lower chest: No acute abnormality. Hepatobiliary: No focal liver abnormality is seen. Status post cholecystectomy. No biliary dilatation. Pancreas: Unremarkable. No pancreatic ductal dilatation or surrounding inflammatory changes. Spleen: Normal in size without focal abnormality. Adrenals/Urinary Tract: No adrenal masses. Some interval enlargement multiple bilateral renal cysts which all demonstrate simple fluid density, consistent with Bosniak I cysts. These do not require dedicated follow-up. The bladder is unremarkable. Stomach/Bowel: Beginning at the cecal tip and extending inferiorly, inflammation is present with lack of a discrete visualized intact appendix. There is vague suggestion of an appendix measuring approximately 9 mm and surrounding fluid and inflammation over a region measuring approximately 13 mm in diameter. Some inflammation extends towards the right inguinal canal and findings are most likely representative of acute appendicitis. No visualized calcified appendicolith. No focal abscess or associated extraluminal air is identified. No evidence of bowel obstruction, ileus or free intraperitoneal air. No focal bowel lesions are identified. Diverticulosis of the sigmoid and descending colon without evidence of diverticulitis. Midline surgical clips/mesh related to prior abdominal surgery. Lymphatic: No enlarged abdominal or pelvic lymph nodes. Reproductive: Prostate is unremarkable. Other: No abdominal wall hernia or abnormality. No abdominopelvic ascites. Musculoskeletal: No acute or significant osseous findings. IMPRESSION: 1. Inflammation at the cecal tip and extending inferiorly  over a region measuring approximately 13 mm in diameter. There is vague suggestion of an appendix measuring approximately 9 mm and surrounding fluid and inflammation. Findings are most likely representative of acute appendicitis. No focal abscess or associated extraluminal air is identified. 2. Evidence of prior aortic tube graft repair with residual aneurysmal disease of the juxta-renal native aorta measuring up to 4.5 x 4.6 cm. This segment is slightly larger compared to 2017 but fairly similar in diameter with increased opacification of the entire lumen compared to the prior CTA. 3. Chronic occlusion of the left common, internal and external iliac arteries. 4. 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 5. Progression of stenosis at the origin of the superior mesenteric artery now likely greater than 50% narrowed and potentially greater than 70% stenosed. 6. Progression of tight stenosis at the origin of a single right renal artery of approximately 50-70%. There is an early bifurcation of this vessel with component of tight stenosis in the superior branch. Evidence of collateral artery development 2 compensate for this segmental stenosis with collaterals communicating between a right L3 lumbar artery and distal renal artery supply. 7. Stable stenosis at the origin of a solitary left renal artery of approximately 50-70%. 8. Diverticulosis of the sigmoid and descending colon without evidence of diverticulitis. Electronically Signed   By: Irish Lack M.D.   On: 06/21/2022 16:42    Scheduled Meds:  [MAR Hold] atorvastatin  40  mg Oral QHS   [MAR Hold] cholecalciferol  5,000 Units Oral Daily   [MAR Hold] diltiazem  180 mg Oral Daily   [MAR Hold] lisinopril  20 mg Oral Daily   And   [MAR Hold] hydrochlorothiazide  12.5 mg Oral Daily   Continuous Infusions:  lactated ringers 75 mL/hr at 06/22/22 0905   [MAR Hold] piperacillin-tazobactam (ZOSYN)  IV 3.375 g (06/22/22 0902)   PRN Meds: [ZOX Hold]  acetaminophen **OR** [MAR Hold] acetaminophen, [MAR Hold] hydrALAZINE, [MAR Hold]  morphine injection, [MAR Hold] ondansetron **OR** [MAR Hold] ondansetron (ZOFRAN) IV, [MAR Hold] oxyCODONE, [MAR Hold] senna-docusate  Time spent: 35 minutes  Author: Gillis Santa. MD [facility] 06/22/2022 3:14 PM  To reach On-call, see care teams to locate the attending and reach out to them via [email or web address]. If 7PM-7AM, please contact night-coverage If you still have difficulty reaching the attending provider, please page the [facility] (Director on Call) for [facility] on amion for assistance.

## 2022-06-22 NOTE — Op Note (Signed)
Pre-op Diagnosis: Acute appendicitis   Post op Diagnosis: Acute appenditicis  Procedure: Robotic assisted laparoscopic appendectomy.  Anesthesia: GETA  Surgeon: Carolan Shiver, MD, FACS  Wound Classification: clean contaminated  Specimen: Appendix  Complications: None  Estimated Blood Loss: 3 mL   Indications: Patient is a 89 y.o. male  presented with above right lower quadrant pain. CT scan shows acute appendicitis.  Initially given antibiotic treatment trial but no improvement of pain in 24 hours.  FIndings: 1.  Contained perforation of appendix 2. No peri-appendiceal abscess or phlegmon 3. Normal anatomy 4. Adequate hemostasis.   Description of procedure: The patient was placed on the operating table in the supine position. General anesthesia was induced. A time-out was completed verifying correct patient, procedure, site, positioning, and implant(s) and/or special equipment prior to beginning this procedure. The abdomen was prepped and draped in the usual sterile fashion.   Palmer's point located and Veress needle was inserted.  After confirming 2 clicks and a positive saline drop test, gas insufflation was initiated until the abdominal pressure was measured at 15 mmHg.  Afterwards, the Veress needle was removed and a 8 mm port was placed in left upper quadrant area using Optiview technique.  After local was infused, 3 additional incision on the left abdominal wall were made 5 cm apart.  An 12 mm port and two other 8 mm ports were placed under direct visualization.  No injuries from trocar placements were noted.  The table was placed in the Trendelenburg position with the right side elevated.  With the use of Tip up grasper, fenestrated bipolar and monopolar scissors, an inflamed appendix was identified and elevated.  Window created at base of appendix in the mesentery.  Abundant amount of adhesion identified.  Difficult time-consuming lysis of adhesion was done to be able to  have access to the right lower quadrant.  The mesoappendix was divided with combination of bipolar energy and monopolar scissors.  The base of the appendix was ligated with 3-0 V-Loc.  The appendix was divided with monopolar scissors.  A second layer of the 3 oh V-Loc was done over the appendiceal stump.  Vistaseal was irrigated in the cecum.  The appendiceal stump was examined and hemostasis noted. No other pathology was identified within pelvis. The 12 mm trocar removed and port site closed with PMI using 0 vicryl under direct vision. Remaining trocars were removed under direct vision. No bleeding was noted.The abdomen was allowed to collapse.  All skin incisions then closed with subcuticular sutures Monocryl 4-0.  Wounds then dressed with dermabond.  The patient tolerated the procedure well, awakened from anesthesia and was taken to the postanesthesia care unit in satisfactory condition.  Sponge count and instrument count correct at the end of the procedure.

## 2022-06-22 NOTE — Progress Notes (Signed)
CBG preop [id number]

## 2022-06-22 NOTE — Consult Note (Signed)
Consultation Note Date: 06/22/2022   Patient Name: [name]  DOB: 07-06-1930  MRN: [id number]  Age / Sex: 89 y.o., male  PCP: Gracelyn Nurse, MD Referring Physician: Gillis Santa, MD  Reason for Consultation: Establishing goals of care  HPI/Patient Profile: [name] is a 89 year old male with history of hyperlipidemia, atrial fibrillation on Eliquis, hypertension, who presents emergency department for chief concerns of right lower quadrant abdominal pain and nausea.  He is currently being admitted for suspected appendicitis.  Clinical Assessment and Goals of Care: Notes and labs reviewed.  In to see patient; he denies complaint at this time.  No family at bedside.  Patient states he is widowed and has no children.  He states he lives alone and is fully independent.  He continues to mow his yard, drive, and get groceries.  He advises that he has a sister who lives in [place].  He advises that he would want a man by the name of Andrey Campanile [place] Scrape) Colette Ribas to be his Runner, broadcasting/film/video.  He states that this is his late sister's husband.  He advises that H POA and living will have been completed for this.  We discussed his diagnosis, prognosis, GOC, EOL wishes disposition and options.  Created space and opportunity for patient  to explore thoughts and feelings regarding current medical information.   A detailed discussion was had today regarding advanced directives.  Concepts specific to code status, artifical feeding and hydration, IV antibiotics and rehospitalization were discussed.  The difference between an aggressive medical intervention path and a comfort care path was discussed.  Values and goals of care important to patient and family were attempted to be elicited.  Discussed limitations of medical interventions to prolong quality of life in some situations and discussed the concept of human  mortality.  He advises that his quality of life and independence is very important to him he states he would not want to ever be placed on a ventilator, and would not want CPR or resuscitative efforts if his heart and breathing stop.  He advises he would not want a feeding tube.  He would want to continue to treat the treatable.  I completed a MOST form today and the signed original was placed in the chart. The form was scanned and sent to medical records for it to be uploaded under ACP tab in Epic. A photocopy was also placed in the chart to be scanned into EMR. The patient outlined their wishes for the following treatment decisions:  Cardiopulmonary Resuscitation: Do Not Attempt Resuscitation (DNR/No CPR)  Medical Interventions: Limited Additional Interventions: Use medical treatment, IV fluids and cardiac monitoring as indicated, DO NOT USE intubation or mechanical ventilation. May consider use of less invasive airway support such as BiPAP or CPAP. Also provide comfort measures. Transfer to the hospital if indicated. Avoid intensive care.   Antibiotics: Antibiotics if indicated  IV Fluids: IV fluids if indicated  Feeding Tube: No feeding tube       SUMMARY OF RECOMMENDATIONS  Continue current care.  PMT will follow.  Prognosis:  Unable to determine      Primary Diagnoses: Present on Admission:  Appendicitis  Stage 3b chronic kidney disease (HCC)  PVD (peripheral vascular disease) (HCC)  Hyperlipidemia  Essential hypertension  AAA (abdominal aortic aneurysm) (HCC)  Coronary artery disease involving native coronary artery of native heart without angina pectoris  Atrial fibrillation (HCC)   I have reviewed the medical record, interviewed the patient and family, and examined the patient. The following aspects are pertinent.  Past Medical History:  Diagnosis Date   AAA (abdominal aortic aneurysm) (HCC)    Actinic keratosis    Arthritis    in fingers and hands   Basal cell  carcinoma 09/01/2021   Right medial canthus/lower eyelid. Nodular BCC. Excixed 09/01/2021   Cancer (HCC)    skin cancer removed several   Diabetes mellitus without complication (HCC)    Dysrhythmia    atrial fibrillation.  on eliquis   History of kidney stones    Hx of basal cell carcinoma 06/30/2016   L proximal mandible   Hx of basal cell carcinoma 07/19/2016   L medial lower eyelid   Hx of basal cell carcinoma 09/20/2016   R upper back lateral   Hx of basal cell carcinoma 12/01/2016   L post shoulder sup medial scapula   Hx of basal cell carcinoma 12/01/2016   L forehead, 2.0cm above med brow   Hx of basal cell carcinoma 12/17/2018   L forehead aboove medial brow   Hx of melanoma in situ 08/16/2016   L proximal mandible/infra auricular   Hypertension    Mitral insufficiency    per dr. Juliann Pares   Myocardial infarct [facility]) 1990   Squamous cell carcinoma of skin 11/01/2017   R superior sideburn in hairline   Squamous cell carcinoma of skin 01/03/2018   L medial dorsum wrist lateral and medial   Squamous cell carcinoma of skin 01/03/2018   L proximal thumb   Squamous cell carcinoma of skin 04/12/2018   L dorsum proximal index finger   Squamous cell carcinoma of skin 04/12/2018   L dorsum medial proximal thumb   Squamous cell carcinoma of skin 07/04/2018   R superior sideburn   Squamous cell carcinoma of skin 07/04/2018   L dorsum proximal index finger   Squamous cell carcinoma of skin 11/13/2018   R helix   Squamous cell carcinoma of skin 03/05/2020   scalp   Transfusion history 1990   received several units of blood after CABG   Social History   Socioeconomic History   Marital status: Widowed    Spouse name: Not on file   Number of children: Not on file   Years of education: Not on file   Highest education level: Not on file  Occupational History   Not on file  Tobacco Use   Smoking status: Former    Years: 30    Types: Cigarettes    Quit date: 01/25/1988     Years since quitting: 34.4   Smokeless tobacco: Never  Vaping Use   Vaping Use: Never used  Substance and Sexual Activity   Alcohol use: No   Drug use: No   Sexual activity: Not Currently  Other Topics Concern   Not on file  Social History Narrative   Not on file   Social Determinants of Health   Financial Resource Strain: Not on file  Food Insecurity: No Food Insecurity (06/22/2022)   Hunger Vital Sign  Worried About Programme researcher, broadcasting/film/video in the Last Year: Never true    Ran Out of Food in the Last Year: Never true  Transportation Needs: No Transportation Needs (06/22/2022)   PRAPARE - Administrator, Civil Service (Medical): No    Lack of Transportation (Non-Medical): No  Physical Activity: Not on file  Stress: Not on file  Social Connections: Not on file   Family History  Problem Relation Age of Onset   Cerebral aneurysm Mother        died at 39   CAD Father        died at 60   Scheduled Meds:  atorvastatin  40 mg Oral QHS   cholecalciferol  5,000 Units Oral Daily   diltiazem  180 mg Oral Daily   lisinopril  20 mg Oral Daily   And   hydrochlorothiazide  12.5 mg Oral Daily   Continuous Infusions:  lactated ringers 75 mL/hr at 06/22/22 0905   piperacillin-tazobactam (ZOSYN)  IV 3.375 g (06/22/22 0902)   PRN Meds:.acetaminophen **OR** acetaminophen, hydrALAZINE, morphine injection, ondansetron **OR** ondansetron (ZOFRAN) IV, oxyCODONE, senna-docusate Medications Prior to Admission:  Prior to Admission medications   Medication Sig Start Date End Date Taking? Authorizing Provider  acetaminophen (TYLENOL) 325 MG tablet Take 2 tablets (650 mg total) by mouth every 6 (six) hours as needed for mild pain or fever. 05/30/19  Yes Stegmayer, Ranae Plumber, PA-C  atorvastatin (LIPITOR) 40 MG tablet Take 40 mg by mouth daily. 06/26/14  Yes [provider]  Cholecalciferol (VITAMIN D3) 5000 units CAPS Take 5,000 Units by mouth daily.   Yes [provider]   diltiazem (CARDIZEM CD) 180 MG 24 hr capsule Take 180 mg by mouth daily. 08/31/20  Yes [provider]  ELIQUIS 5 MG TABS tablet Take 1 tablet (5 mg total) by mouth 2 (two) times daily. 08/11/17  Yes Byrnett, Merrily Pew, MD  lisinopril-hydrochlorothiazide (PRINZIDE,ZESTORETIC) 20-12.5 MG tablet Take 1 tablet by mouth 2 (two) times daily.  02/26/15  Yes [provider]  tetrahydrozoline (GOODSENSE EYE DROPS) 0.05 % ophthalmic solution Place 1 drop into both eyes daily as needed (dry eyes).   Yes [provider]  fluorouracil (EFUDEX) 5 % cream Apply topically 2 (two) times daily. Starting on 12/17/2021 apply to forehead up into hairline and temples bid for 7 days Patient not taking: Reported on 06/21/2022 12/02/21   Deirdre Evener, MD  glucose blood (BAYER CONTOUR TEST) test strip 1 strip by Does not apply route 3 (three) times daily. 03/14/14   [provider]  Omega-3 Fatty Acids (FISH OIL) 1000 MG CAPS Take 1,000 mg by mouth daily.  Patient not taking: Reported on 06/21/2022    [provider]  predniSONE (DELTASONE) 10 MG tablet Take 1 tablet (10 mg total) by mouth daily. 6,5,4,3,2,1 six day taper Patient not taking: Reported on 06/21/2022 09/08/20   Evon Slack, PA-C  vitamin B-12 (CYANOCOBALAMIN) 1000 MCG tablet Take 1,000 mcg by mouth 2 (two) times a week. Patient not taking: Reported on 06/21/2022    [provider]   Allergies  Allergen Reactions   Morphine And Codeine Nausea And Vomiting   Sulfa Antibiotics Rash   Review of Systems  All other systems reviewed and are negative.   Physical Exam Pulmonary:     Effort: Pulmonary effort is normal.  Neurological:     Mental Status: He is alert.     Vital Signs: BP (!) 129/55 (BP Location: Right  Arm)   Pulse 78   Temp 98.8 F (37.1 C) (Oral)   Resp 16   Ht 6' (1.829 m)   Wt 72.1 kg   SpO2 96%   BMI 21.56 kg/m  Pain Scale: 0-10   Pain Score: 2    SpO2: SpO2: 96 % O2  Device:SpO2: 96 % O2 Flow Rate: .   IO: Intake/output summary: No intake or output data in the 24 hours ending 06/22/22 1306  LBM:   Baseline Weight: Weight: 72.1 kg Most recent weight: Weight: 72.1 kg       Signed by: Morton Stall, NP   Please contact Palliative Medicine Team phone at [phone] for questions and concerns.  For individual provider: See Loretha Stapler

## 2022-06-23 ENCOUNTER — Encounter: Payer: Self-pay | Admitting: General Surgery

## 2022-06-23 DIAGNOSIS — I2510 Atherosclerotic heart disease of native coronary artery without angina pectoris: Secondary | ICD-10-CM

## 2022-06-23 DIAGNOSIS — Z515 Encounter for palliative care: Secondary | ICD-10-CM

## 2022-06-23 DIAGNOSIS — I5020 Unspecified systolic (congestive) heart failure: Secondary | ICD-10-CM

## 2022-06-23 DIAGNOSIS — K353 Acute appendicitis with localized peritonitis, without perforation or gangrene: Secondary | ICD-10-CM | POA: Diagnosis not present

## 2022-06-23 DIAGNOSIS — K3580 Unspecified acute appendicitis: Secondary | ICD-10-CM | POA: Diagnosis not present

## 2022-06-23 LAB — MAGNESIUM: Magnesium: 1.6 mg/dL — ABNORMAL LOW (ref 1.7–2.4)

## 2022-06-23 LAB — BASIC METABOLIC PANEL
Anion gap: 12 (ref 5–15)
BUN: 26 mg/dL — ABNORMAL HIGH (ref 8–23)
CO2: 26 mmol/L (ref 22–32)
Calcium: 8.9 mg/dL (ref 8.9–10.3)
Chloride: 99 mmol/L (ref 98–111)
Creatinine, Ser: 1.4 mg/dL — ABNORMAL HIGH (ref 0.61–1.24)
GFR, Estimated: 48 mL/min — ABNORMAL LOW (ref >60)
Glucose, Bld: 181 mg/dL — ABNORMAL HIGH (ref 70–99)
Potassium: 3.8 mmol/L (ref 3.5–5.1)
Sodium: 137 mmol/L (ref 135–145)

## 2022-06-23 LAB — CBC
HCT: 35.4 % — ABNORMAL LOW (ref 39.0–52.0)
Hemoglobin: 11.4 g/dL — ABNORMAL LOW (ref 13.0–17.0)
MCH: 30.9 pg (ref 26.0–34.0)
MCHC: 32.2 g/dL (ref 30.0–36.0)
MCV: 95.9 fL (ref 80.0–100.0)
Platelets: 151 10*3/uL (ref 150–400)
RBC: 3.69 MIL/uL — ABNORMAL LOW (ref 4.22–5.81)
RDW: 14.2 % (ref 11.5–15.5)
WBC: 11.9 10*3/uL — ABNORMAL HIGH (ref 4.0–10.5)
nRBC: 0 % (ref 0.0–0.2)

## 2022-06-23 LAB — PHOSPHORUS: Phosphorus: 4 mg/dL (ref 2.5–4.6)

## 2022-06-23 MED ORDER — MAGNESIUM SULFATE 2 GM/50ML IV SOLN
2.0000 g | Freq: Once | INTRAVENOUS | Status: AC
  Administered 2022-06-23: 2 g via INTRAVENOUS
  Filled 2022-06-23: qty 50

## 2022-06-23 MED ORDER — MAGNESIUM OXIDE 400 MG PO TABS: 400.0000 mg | ORAL_TABLET | Freq: Two times a day (BID) | ORAL | 0 refills | Status: AC

## 2022-06-23 NOTE — TOC CM/SW Note (Signed)
Transition of Care [facility]) - Inpatient Brief Assessment   Patient Details  Name: [name] MRN: [id number] Date of Birth: 08/07/1930  Transition of [facility]) CM/SW Contact:    Margarito Liner, LCSW Phone Number: 06/23/2022, 1:38 PM   Clinical Narrative: Patient has orders to discharge home today. Chart reviewed. No TOC needs identified. CSW signing off.   Transition of Care Asessment: Insurance and Status: Insurance coverage has been reviewed Patient has primary care physician: Yes Home environment has been reviewed: Yes Prior level of function:: Ambulatory Prior/Current Home Services: No current home services Social Determinants of Health Reivew: SDOH reviewed no interventions necessary Readmission risk has been reviewed: Yes Transition of care needs: no transition of care needs at this time

## 2022-06-23 NOTE — Progress Notes (Signed)
Patient ID: [name], male   DOB: 12-04-1930, 89 y.o.   MRN: [id number]     SURGICAL PROGRESS NOTE   Hospital Day(s): 2.   Interval History: Patient seen and examined, no acute events or new complaints overnight. Patient reports feeling well.  He denies any abdominal pain.  He tolerated diet.  He feels very comfortable.  He is in good spirit.  Vital signs in last 24 hours: [min-max] current  Temp:  [97 F (36.1 C)-98.9 F (37.2 C)] 98.4 F (36.9 C) (05/30 0732) Pulse Rate:  [60-93] 60 (05/30 0732) Resp:  [16-18] 18 (05/30 0732) BP: (107-146)/(46-85) 126/65 (05/30 0732) SpO2:  [93 %-100 %] 98 % (05/30 0732)     Height: 6' (182.9 cm) Weight: 72.1 kg BMI (Calculated): 21.55   Physical Exam:  Constitutional: alert, cooperative and no distress  Respiratory: breathing non-labored at rest  Cardiovascular: regular rate and sinus rhythm  Gastrointestinal: soft, non-tender, and non-distended.  The wounds are dry and clean  Labs:     Latest Ref Rng & Units 06/23/2022    4:15 AM 06/22/2022    6:22 AM 06/21/2022    1:04 PM  CBC  WBC 4.0 - 10.5 K/uL 11.9  9.9  9.5   Hemoglobin 13.0 - 17.0 g/dL 82.9  56.2  13.0   Hematocrit 39.0 - 52.0 % 35.4  33.3  36.8   Platelets 150 - 400 K/uL 151  148  163       Latest Ref Rng & Units 06/23/2022    4:15 AM 06/22/2022    6:22 AM 06/21/2022    1:04 PM  CMP  Glucose 70 - 99 mg/dL 865  784  696   BUN 8 - 23 mg/dL 26  28  35   Creatinine 0.61 - 1.24 mg/dL 2.95  2.84  1.32   Sodium 135 - 145 mmol/L 137  139  140   Potassium 3.5 - 5.1 mmol/L 3.8  3.5  3.5   Chloride 98 - 111 mmol/L 99  101  102   CO2 22 - 32 mmol/L 26  29  26    Calcium 8.9 - 10.3 mg/dL 8.9  9.0  9.6   Total Protein 6.5 - 8.1 g/dL   6.5   Total Bilirubin 0.3 - 1.2 mg/dL   1.2   Alkaline Phos 38 - 126 U/L   64   AST 15 - 41 U/L   23   ALT 0 - 44 U/L   12     Imaging studies: No new pertinent imaging studies   Assessment/Plan:  89 y.o. male with acute appendicitis 1 Day Post-Op  s/p robotic appendectomy.  -Recovering extremely well.  He feels very comfortable. -No pain.  Tolerating diet. -The wounds are dry and clean. -Hemoglobin stable.  No contraindication to restart anticoagulation. -Patient can be discharged from surgical standpoint.  I will follow him in 2 weeks in my office.  Gae Gallop, MD

## 2022-06-23 NOTE — Progress Notes (Signed)
Palliative Care Progress Note, Assessment & Plan   Patient Name: [name]       Date: 06/23/2022 DOB: [birth year]  Age: 89 y.o. MRN#: [id number] Attending Physician: Gillis Santa, MD Primary Care Physician: Gracelyn Nurse, MD Admit Date: 06/21/2022  Subjective: Patient is lying in bed in no apparent distress.  He acknowledges my presence and is able to make his wishes known.  He is alert and oriented x 4.  He has no acute complaints of pain or discomfort at this time.  No family or friends present at bedside during my visit.  HPI: [name] is a 89 year old male with history of hyperlipidemia, atrial fibrillation on Eliquis, hypertension, who presents emergency department for chief concerns of right lower quadrant abdominal pain and nausea  On admission, UA was negative for leukocytes and nitrates. CTA abdomen pelvis with and without contrast revealed inflammation at the cecal tip and extending inferiorly over a region measuring approximately 13 mm in diameter.  Vague suggestion of an appendix measuring approximately 9 mm and surrounding fluid and inflammation.  Findings are most likely representative of acute appendicitis.  No focal abscess and associated extraluminal air is identified.   5/29, patient underwent appendectomy without complication.   PMT was consulted to discuss GOC.   Summary of counseling/coordination of care: After reviewing the patient's chart and assessing the patient at bedside, I spoke with patient in regards to plan and goals of care.  He shares he is feeling well, has no complaints, and is looking forward to going home today.  I attempted to elicit values and goals important to the patient.  He shares that he wants to go home and continue to do what he was doing.  He  wants to treat the treatable.  Symptoms assessed.  Patient shares he has no pain "whatsoever".  No adjustments to [facility] needed at this time.  Goals are clear.  DNR remains.  Plan is for patient to discharge home today.  No acute palliative needs at this time.  Patient has PMT contact info and is encouraged to call with any future acute palliative needs during this hospitalization.  PMT will shadow patient and monitor him peripherally.   Please reengage with PMT at patient/family's request, if patient's health deteriorates during hospitalization, or if goals change.  Physical Exam Constitutional:      General: He is not in acute distress.    Appearance: He is normal weight. He is not ill-appearing.  HENT:     Head: Normocephalic.  Cardiovascular:     Rate and Rhythm: Normal rate.     Heart sounds: Normal heart sounds.  Pulmonary:     Effort: Pulmonary effort is normal.  Abdominal:     Tenderness: There is no abdominal tenderness.  Skin:    General: Skin is warm and dry.  Neurological:     Mental Status: He is alert and oriented to person, place, and time.  Psychiatric:        Mood and Affect: Mood normal. Mood is not anxious.        Behavior: Behavior normal.             Total Time 25 minutes  Samara Deist L. Manon Hilding, FNP-BC Palliative Medicine Team Team Phone # [phone]

## 2022-06-23 NOTE — Progress Notes (Signed)
PT Cancellation Note  Patient Details Name: [name] MRN: [id number] DOB: 20-Aug-1930   Cancelled Treatment:    Reason Eval/Treat Not Completed: PT screened, no needs identified, will sign off. PT spoke with pt and RN, both reported pt is ambulatory this admission and pt stated he feels just fine to go home, no PT needs, no DME needs.   Olga Coaster PT, DPT 1:23 PM,06/23/22

## 2022-06-23 NOTE — Discharge Summary (Signed)
[facility] Discharge Summary   Patient: [name] WUJ:[id number]  PCP: Gracelyn Nurse, MD  Date of admission: 06/21/2022   Date of discharge:  06/23/2022     Discharge Diagnoses:  Principal Problem:   Appendicitis Active Problems:   Essential hypertension   Impaired renal function   AAA (abdominal aortic aneurysm) (HCC)   Coronary artery disease involving native coronary artery of native heart without angina pectoris   Hyperlipidemia   S/P CABG x 3   Atrial fibrillation (HCC)   PVD (peripheral vascular disease) (HCC)   Stage 3b chronic kidney disease (HCC)   Heart failure with reduced ejection fraction (HCC)   Admitted From: Home Disposition:  Home   Recommendations for Outpatient Follow-up:  Follow-up with PCP in 1 week, repeat CBC BMP and magnesium after 1 week Follow-up with general surgery in 1 week Follow-up with vascular surgery in 1 to 2 weeks as an outpatient for chronic PAD and abdominal aortic aneurysm. Follow up LABS/TEST: CBC, BMP and magnesium in 1 week   Follow-up Information     Dew, Marlow Baars, MD Follow up in 2 week(s).   Specialties: Vascular Surgery, Radiology, Interventional Cardiology Contact information: [address] [place] [address] [phone]         Carolan Shiver, MD Follow up in 1 week(s).   Specialty: General Surgery Contact information: [address] [place] [address] [phone]         Gracelyn Nurse, MD Follow up in 1 week(s).   Specialty: Internal Medicine Why: Repeat CBC, BMP and magnesium after 1 week Contact information: [address] [place] [address] [phone]                Diet recommendation: Cardiac diet  Activity: The patient is advised to gradually reintroduce usual activities, as tolerated  Discharge Condition: stable  Code Status: DNR   History of present illness: As per the H and P dictated on admission Hospital Course:   [name] is a 89 year old male with history of hyperlipidemia, atrial fibrillation on Eliquis, hypertension, who presents emergency department for chief concerns of right lower quadrant abdominal pain and nausea ED w/up: Temp 98.1, RR 18, HR 82, BP 149/60, SpO2 of 97% on room air. Serum Na 140, K 3.5, chloride 102, bicarb 26, BUN of 35, serum Cr 1.59, nonfasting blood glucose 150, EGFR 41, WBC 9.5, platelets 163, hemoglobin 11.8. UA was negative for leukocytes and nitrates. CTA abdomen pelvis with and without contrast: Inflammation at the cecal tip and extending inferiorly over a region measuring approximately 13 mm in diameter.  Vague suggestion of an appendix measuring approximately 9 mm and surrounding fluid and inflammation.  Findings are most likely representative of acute appendicitis.  No focal abscess and associated extraluminal air is identified.   EDP discussed with general surgery who recommends conservative management with IV antibiotic at this time   Assessment and Plan: # Acute appendicitis S/p Zosyn as per pharmacy. General surgery consulted, s/p appendectomy done on 5/29, patient tolerated procedure well.  Patient was hydrated and continued antibiotic overnight.  Today morning patient was seen by general surgery, patient is doing fine pain is under control.  Cleared by general surgery to discharge home and follow-up as an outpatient. # HTN, HLD, chronic systolic CHF, CAD s/p CABG, A-fib Continue Cardizem CD1 80 mg p.o. daily, lisinopril 20 mg p.o. daily, Lipitor 40 mg p.o. daily, hydrochlorothiazide 12.5 mg p.o. daily home medications.  Eliquis  held during hospital stay, cleared by general surgery to resume Eliquis tonight.  Patient is stable to discharge and follow-up as an outpatient with PCP # Peripheral artery disease, left lower extremity occlusion of common iliac, internal iliac and femoral artery.  SMA stenosis, left renal stenosis, AAA. Vascular surgery consulted,  recommended no intervention as patient is asymptomatic.  Follow-up as an outpatient. Palliative care consulted due to poor prognosis, CODE STATUS was changed to DNR.   Body mass index is 21.56 kg/m.  Nutrition Interventions:  Patient was ambulatory without any assistance. On the day of the discharge the patient's vitals were stable, and no other acute medical condition were reported by patient. the patient was felt safe to be discharge at Home.  Consultants: General surgery Procedures: s/p Robotic assisted laparoscopic appendectomy   Discharge Exam: General: Appear in no distress, no Rash; Oral Mucosa Clear, moist. Cardiovascular: S1 and S2 Present, no Murmur, Respiratory: normal respiratory effort, Bilateral Air entry present and no Crackles, no wheezes Abdomen: Bowel Sound present, Soft and no tenderness, no hernia Extremities: no Pedal edema, no calf tenderness Neurology: alert and oriented to time, place, and person affect appropriate.  Filed Weights   06/21/22 1439  Weight: 72.1 kg   Vitals:   06/23/22 0446 06/23/22 0732  BP: 109/61 126/65  Pulse: 72 60  Resp: 17 18  Temp: 98.2 F (36.8 C) 98.4 F (36.9 C)  SpO2: 96% 98%    DISCHARGE MEDICATION: Allergies as of 06/23/2022       Reactions   Morphine And Codeine Nausea And Vomiting   Sulfa Antibiotics Rash        Medication List     STOP taking these medications    cyanocobalamin 1000 MCG tablet Commonly known as: VITAMIN B12   Fish Oil 1000 MG Caps   fluorouracil 5 % cream Commonly known as: EFUDEX   predniSONE 10 MG tablet Commonly known as: DELTASONE       TAKE these medications    acetaminophen 325 MG tablet Commonly known as: TYLENOL Take 2 tablets (650 mg total) by mouth every 6 (six) hours as needed for mild pain or fever.   atorvastatin 40 MG tablet Commonly known as: LIPITOR Take 40 mg by mouth daily.   Bayer Contour Test test strip Generic drug: glucose blood 1 strip by Does  not apply route 3 (three) times daily.   diltiazem 180 MG 24 hr capsule Commonly known as: CARDIZEM CD Take 180 mg by mouth daily.   Eliquis 5 MG Tabs tablet Generic drug: apixaban Take 1 tablet (5 mg total) by mouth 2 (two) times daily.   GoodSense Eye Drops 0.05 % ophthalmic solution Generic drug: tetrahydrozoline Place 1 drop into both eyes daily as needed (dry eyes).   lisinopril-hydrochlorothiazide 20-12.5 MG tablet Commonly known as: ZESTORETIC Take 1 tablet by mouth 2 (two) times daily.   magnesium oxide 400 MG tablet Commonly known as: MAG-OX Take 1 tablet (400 mg total) by mouth 2 (two) times daily for 7 days.   Vitamin D3 125 MCG (5000 UT) Caps Take 5,000 Units by mouth daily.       Allergies  Allergen Reactions   Morphine And Codeine Nausea And Vomiting   Sulfa Antibiotics Rash   Discharge Instructions     Call MD for:   Complete by: As directed    Chest pain or palpitations   Call MD for:  difficulty breathing, headache or visual disturbances   Complete by: As directed  Call MD for:  extreme fatigue   Complete by: As directed    Call MD for:  persistant dizziness or light-headedness   Complete by: As directed    Call MD for:  persistant nausea and vomiting   Complete by: As directed    Call MD for:  severe uncontrolled pain   Complete by: As directed    Call MD for:  temperature >100.4   Complete by: As directed    Diet - low sodium heart healthy   Complete by: As directed    Discharge instructions   Complete by: As directed    Follow-up with PCP in 1 week, repeat CBC BMP and magnesium after 1 week Follow-up with general surgery in 1 week Follow-up with vascular surgery in 1 to 2 weeks as an outpatient for chronic PAD and abdominal aortic aneurysm.   Increase activity slowly   Complete by: As directed        The results of significant diagnostics from this hospitalization (including imaging, microbiology, ancillary and laboratory) are  listed below for reference.    Significant Diagnostic Studies: CT Angio Abd/Pel W and/or Wo Contrast  Result Date: 06/21/2022 CLINICAL DATA:  Right lower quadrant abdominal pain, nausea and history of abdominal aortic aneurysm. Prior right common iliac artery stenting extending into the distal aspect of an aortic tube graft. EXAM: CT ANGIOGRAPHY ABDOMEN AND PELVIS WITH CONTRAST TECHNIQUE: Multidetector CT imaging of the abdomen and pelvis was performed using the standard protocol during bolus administration of intravenous contrast. Multiplanar reconstructed images and MIPs were obtained and reviewed to evaluate the vascular anatomy. RADIATION DOSE REDUCTION: This exam was performed according to the departmental dose-optimization program which includes automated exposure control, adjustment of the mA and/or kV according to patient size and/or use of iterative reconstruction technique. CONTRAST:  80mL OMNIPAQUE IOHEXOL 350 MG/ML SOLN COMPARISON:  Prior CTA of the abdomen and pelvis on 06/29/2015 FINDINGS: VASCULAR Aorta: Beginning immediately below the level of the renal arteries, residual aneurysmal disease of the native aorta measures up to approximately 4.5 x 4.6 cm. This segment is slightly larger compared to 2017 but fairly similar in diameter with increased opacification of the entire lumen compared to prior to aortic repair. A tube graft is now present extending from the infrarenal segment to just above the bifurcation. This graft measures approximately 3 cm in diameter and demonstrates a mild amount of internal mural thrombus. No evidence of aortic rupture or dissection. Significant atherosclerosis and mild aneurysmal disease of the visualized lower descending thoracic aorta demonstrating calcified plaque and irregular mural thrombus. Maximal caliber is approximately 3.3 cm. Celiac: Mild stenosis at the origin of the celiac axis of 30-40%. SMA: There is some probable progression of stenosis at the origin  of the superior mesenteric artery now likely greater than 50% narrowed and potentially greater than 70% stenosed. Renals: Origin stenosis of a single right renal artery likely progressive and approximately 50-70%. There is an early bifurcation of this vessel with component of tight stenosis in the superior branch. There is evidence of collateralization due to this tight stenosis with a tortuous network of collaterals extending inferiorly just behind the IVC and eventually demonstrating anastomosis between renal artery supply and an enlarged right L3 lumbar artery. Stable stenosis at the origin of a solitary left renal artery of approximately 50-70%. IMA: Occluded origin and reconstituted distal branches by SMA collaterals. Inflow: Right-sided iliac stenting with a common iliac stent extending just into the distal aspect of the aortic tube graft  and additional overlapping stents extending into the mid external iliac artery. Stented segments appear widely patent. Chronic occlusion of the left common, internal and external iliac arteries. Proximal Outflow: The right common femoral artery is normally patent as is the femoral bifurcation. A proximal SFA stent is partially visualized. The left common femoral artery is chronically occluded. Veins: Venous phase imaging demonstrates normally patent venous structures in the abdomen and pelvis. Review of the MIP images confirms the above findings. NON-VASCULAR Lower chest: No acute abnormality. Hepatobiliary: No focal liver abnormality is seen. Status post cholecystectomy. No biliary dilatation. Pancreas: Unremarkable. No pancreatic ductal dilatation or surrounding inflammatory changes. Spleen: Normal in size without focal abnormality. Adrenals/Urinary Tract: No adrenal masses. Some interval enlargement multiple bilateral renal cysts which all demonstrate simple fluid density, consistent with Bosniak I cysts. These do not require dedicated follow-up. The bladder is  unremarkable. Stomach/Bowel: Beginning at the cecal tip and extending inferiorly, inflammation is present with lack of a discrete visualized intact appendix. There is vague suggestion of an appendix measuring approximately 9 mm and surrounding fluid and inflammation over a region measuring approximately 13 mm in diameter. Some inflammation extends towards the right inguinal canal and findings are most likely representative of acute appendicitis. No visualized calcified appendicolith. No focal abscess or associated extraluminal air is identified. No evidence of bowel obstruction, ileus or free intraperitoneal air. No focal bowel lesions are identified. Diverticulosis of the sigmoid and descending colon without evidence of diverticulitis. Midline surgical clips/mesh related to prior abdominal surgery. Lymphatic: No enlarged abdominal or pelvic lymph nodes. Reproductive: Prostate is unremarkable. Other: No abdominal wall hernia or abnormality. No abdominopelvic ascites. Musculoskeletal: No acute or significant osseous findings. IMPRESSION: 1. Inflammation at the cecal tip and extending inferiorly over a region measuring approximately 13 mm in diameter. There is vague suggestion of an appendix measuring approximately 9 mm and surrounding fluid and inflammation. Findings are most likely representative of acute appendicitis. No focal abscess or associated extraluminal air is identified. 2. Evidence of prior aortic tube graft repair with residual aneurysmal disease of the juxta-renal native aorta measuring up to 4.5 x 4.6 cm. This segment is slightly larger compared to 2017 but fairly similar in diameter with increased opacification of the entire lumen compared to the prior CTA. 3. Chronic occlusion of the left common, internal and external iliac arteries. 4. 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 5. Progression of stenosis at the origin of the superior mesenteric artery now likely greater than 50% narrowed  and potentially greater than 70% stenosed. 6. Progression of tight stenosis at the origin of a single right renal artery of approximately 50-70%. There is an early bifurcation of this vessel with component of tight stenosis in the superior branch. Evidence of collateral artery development 2 compensate for this segmental stenosis with collaterals communicating between a right L3 lumbar artery and distal renal artery supply. 7. Stable stenosis at the origin of a solitary left renal artery of approximately 50-70%. 8. Diverticulosis of the sigmoid and descending colon without evidence of diverticulitis. Electronically Signed   By: Irish Lack M.D.   On: 06/21/2022 16:42    Microbiology: Recent Results (from the past 240 hour(s))  Surgical PCR screen     Status: None   Collection Time: 06/22/22  9:32 AM   Specimen: Nasal Mucosa; Nasal Swab  Result Value Ref Range Status   MRSA, PCR NEGATIVE NEGATIVE Final   Staphylococcus aureus NEGATIVE NEGATIVE Final    Comment: (NOTE) The Xpert SA  Assay (FDA approved for NASAL specimens in patients 73 years of age and older), is one component of a comprehensive surveillance program. It is not intended to diagnose infection nor to guide or monitor treatment. Performed at [facility], [address] [place], [place] [address]      Labs: CBC: Recent Labs  Lab 06/21/22 1304 06/22/22 0622 06/23/22 0415  WBC 9.5 9.9 11.9*  HGB 11.8* 10.7* 11.4*  HCT 36.8* 33.3* 35.4*  MCV 95.8 95.4 95.9  PLT 163 148* 151   Basic Metabolic Panel: Recent Labs  Lab 06/21/22 1304 06/22/22 0619 06/22/22 0622 06/23/22 0415  NA 140  --  139 137  K 3.5  --  3.5 3.8  CL 102  --  101 99  CO2 26  --  29 26  GLUCOSE 150*  --  141* 181*  BUN 35*  --  28* 26*  CREATININE 1.59*  --  1.39* 1.40*  CALCIUM 9.6  --  9.0 8.9  MG  --  1.4*  --  1.6*  PHOS  --  2.2*  --  4.0   Liver Function Tests: Recent Labs  Lab 06/21/22 1304  AST 23  ALT 12  ALKPHOS 64   BILITOT 1.2  PROT 6.5  ALBUMIN 4.0   Recent Labs  Lab 06/21/22 1304  LIPASE 32   No results for input(s): "AMMONIA" in the last 168 hours. Cardiac Enzymes: No results for input(s): "CKTOTAL", "CKMB", "CKMBINDEX", "TROPONINI" in the last 168 hours. BNP (last 3 results) No results for input(s): "BNP" in the last 8760 hours. CBG: Recent Labs  Lab 06/22/22 1354 06/22/22 1637  GLUCAP 117* 141*    Time spent: 35 minutes  Signed:  Gillis Santa  [facility] 06/23/2022 12:54 PM

## 2022-06-23 NOTE — Progress Notes (Signed)
OT Cancellation Note  Patient Details Name: [name] MRN: [id number] DOB: [birth year]   Cancelled Treatment:    Reason Eval/Treat Not Completed: OT screened, no needs identified, will sign off. Per chart pt has no skilled acute OT needs, will sign off.   Kathie Dike, M.S. OTR/L  06/23/22, 1:48 PM  ascom [phone]

## 2022-06-23 NOTE — Discharge Instructions (Addendum)
°  Diet: Resume home heart healthy regular diet.  ° °Activity: No heavy lifting >20 pounds (children, pets, laundry, garbage) or strenuous activity until follow-up, but light activity and walking are encouraged. Do not drive or drink alcohol if taking narcotic pain medications. ° °Wound care: May shower with soapy water and pat dry (do not rub incisions), but no baths or submerging incision underwater until follow-up. (no swimming)  ° °Medications: Resume all home medications. For mild to moderate pain: acetaminophen (Tylenol) or ibuprofen (if no kidney disease). Combining Tylenol with alcohol can substantially increase your risk of causing liver disease. Narcotic pain medications, if prescribed, can be used for severe pain, though may cause nausea, constipation, and drowsiness.  ° °Call office ([phone]) at any time if any questions, worsening pain, fevers/chills, bleeding, drainage from incision site, or other concerns. ° °

## 2022-06-28 LAB — SURGICAL PATHOLOGY

## 2022-07-13 ENCOUNTER — Ambulatory Visit: Payer: Medicare PPO | Admitting: Dermatology

## 2022-07-13 DIAGNOSIS — L570 Actinic keratosis: Secondary | ICD-10-CM

## 2022-07-13 DIAGNOSIS — L578 Other skin changes due to chronic exposure to nonionizing radiation: Secondary | ICD-10-CM

## 2022-07-13 DIAGNOSIS — L820 Inflamed seborrheic keratosis: Secondary | ICD-10-CM | POA: Diagnosis not present

## 2022-07-13 DIAGNOSIS — Z79899 Other long term (current) drug therapy: Secondary | ICD-10-CM

## 2022-07-13 DIAGNOSIS — W908XXA Exposure to other nonionizing radiation, initial encounter: Secondary | ICD-10-CM | POA: Diagnosis not present

## 2022-07-13 DIAGNOSIS — Z7189 Other specified counseling: Secondary | ICD-10-CM

## 2022-07-13 DIAGNOSIS — Z5111 Encounter for antineoplastic chemotherapy: Secondary | ICD-10-CM

## 2022-07-13 NOTE — Progress Notes (Unsigned)
Follow-Up Visit   Subjective  [name] is a 89 y.o. male who presents for the following: 4 months follow up - scalp, face, arms and hands treated with LN2 and 5FU/Calcipotriene cream.  The patient has spots, moles and lesions to be evaluated, some may be new or changing and the patient has concerns that these could be cancer.  The following portions of the chart were reviewed this encounter and updated as appropriate: medications, allergies, medical history  Review of Systems:  No other skin or systemic complaints except as noted in HPI or Assessment and Plan.  Objective  Well appearing patient in no apparent distress; mood and affect are within normal limits. A focused examination was performed of the following areas:scalp,face,arms,hands  Relevant exam findings are noted in the Assessment and Plan.  face, arms, hands x 20 (20) Erythematous thin papules/macules with gritty scale.   face, arms x 5 (5) Stuck-on, waxy, tan-brown papule or plaque --Discussed benign etiology and prognosis.    Assessment & Plan    AK (actinic keratosis) (20) face, arms, hands x 20  Actinic keratoses are precancerous spots that appear secondary to cumulative UV radiation exposure/sun exposure over time. They are chronic with expected duration over 1 year. A portion of actinic keratoses will progress to squamous cell carcinoma of the skin. It is not possible to reliably predict which spots will progress to skin cancer and so treatment is recommended to prevent development of skin cancer.  Recommend daily broad spectrum sunscreen SPF 30+ to sun-exposed areas, reapply every 2 hours as needed.  Recommend staying in the shade or wearing long sleeves, sun glasses (UVA+UVB protection) and wide brim hats (4-inch brim around the entire circumference of the hat). Call for new or changing lesions.   Destruction of lesion - face, arms, hands x 20 Complexity: simple   Destruction method: cryotherapy    Informed consent: discussed and consent obtained   Timeout:  patient name, date of birth, surgical site, and procedure verified Lesion destroyed using liquid nitrogen: Yes   Region frozen until ice ball extended beyond lesion: Yes   Outcome: patient tolerated procedure well with no complications   Post-procedure details: wound care instructions given    Inflamed seborrheic keratosis (5) face, arms x 5  Symptomatic, irritating, patient would like treated.   Destruction of lesion - face, arms x 5 Complexity: simple   Destruction method: cryotherapy   Informed consent: discussed and consent obtained   Timeout:  patient name, date of birth, surgical site, and procedure verified Lesion destroyed using liquid nitrogen: Yes   Region frozen until ice ball extended beyond lesion: Yes   Outcome: patient tolerated procedure well with no complications   Post-procedure details: wound care instructions given    ACTINIC DAMAGE WITH PRECANCEROUS ACTINIC KERATOSES Counseling for Topical Chemotherapy Management: Patient exhibits: - Severe, confluent actinic changes with pre-cancerous actinic keratoses that is secondary to cumulative UV radiation exposure over time - Condition that is severe; chronic, not at goal. - diffuse scaly erythematous macules and papules with underlying dyspigmentation - Discussed Prescription "Field Treatment" topical Chemotherapy for Severe, Chronic Confluent Actinic Changes with Pre-Cancerous Actinic Keratoses Field treatment involves treatment of an entire area of skin that has confluent Actinic Changes (Sun/ Ultraviolet light damage) and PreCancerous Actinic Keratoses by method of PhotoDynamic Therapy (PDT) and/or prescription Topical Chemotherapy agents such as 5-fluorouracil, 5-fluorouracil/calcipotriene, and/or imiquimod.  The purpose is to decrease the number of clinically evident and subclinical PreCancerous lesions to prevent progression  to development of skin cancer  by chemically destroying early precancer changes that may or may not be visible.  It has been shown to reduce the risk of developing skin cancer in the treated area. As a result of treatment, redness, scaling, crusting, and open sores may occur during treatment course. One or more than one of these methods may be used and may have to be used several times to control, suppress and eliminate the PreCancerous changes. Discussed treatment course, expected reaction, and possible side effects. - Recommend daily broad spectrum sunscreen SPF 30+ to sun-exposed areas, reapply every 2 hours as needed.  - Staying in the shade or wearing long sleeves, sun glasses (UVA+UVB protection) and wide brim hats (4-inch brim around the entire circumference of the hat) are also recommended. - Call for new or changing lesions.  In August re start 5FU/Calcipotriene cream apply to temples twice a day for 7 days then apply to hands twice a day for 10 days, then apply to arms twice a day for 10 days   Return in about 5 months (around 12/13/2022) for Aks .  IAngelique Holm, CMA, am acting as scribe for Armida Sans, MD .   Documentation: I have reviewed the above documentation for accuracy and completeness, and I agree with the above.  Armida Sans, MD

## 2022-07-13 NOTE — Patient Instructions (Addendum)
In August re start 5FU/Calcipotriene cream apply to temples twice a day for 7 days then apply to hands twice a day for 10 days, then apply to arms twice a day for 10 days   Instructions for [facility] Medications  One or more of your medications was sent to the [facility] mail order compounding pharmacy. You will receive an email from them and can purchase the medicine through that link. It will then be mailed to your home at the address you confirmed. If for any reason you do not receive an email from them, please check your spam folder. If you still do not find the email, please let us know. [facility] phone number is [phone].      5-Fluorouracil/Calcipotriene Patient Education   Actinic keratoses are the dry, red scaly spots on the skin caused by sun damage. A portion of these spots can turn into skin cancer with time, and treating them can help prevent development of skin cancer.   Treatment of these spots requires removal of the defective skin cells. There are various ways to remove actinic keratoses, including freezing with liquid nitrogen, treatment with creams, or treatment with a blue light procedure in the office.   5-fluorouracil cream is a topical cream used to treat actinic keratoses. It works by interfering with the growth of abnormal fast-growing skin cells, such as actinic keratoses. These cells peel off and are replaced by healthy ones.   5-fluorouracil/calcipotriene is a combination of the 5-fluorouracil cream with a vitamin D analog cream called calcipotriene. The calcipotriene alone does not treat actinic keratoses. However, when it is combined with 5-fluorouracil, it helps the 5-fluorouracil treat the actinic keratoses much faster so that the same results can be achieved with a much shorter treatment time.  INSTRUCTIONS FOR 5-FLUOROURACIL/CALCIPOTRIENE CREAM:   5-fluorouracil/calcipotriene cream typically only needs to be used for 4-7 days. A thin layer  should be applied twice a day to the treatment areas recommended by your physician.   If your physician prescribed you separate tubes of 5-fluourouracil and calcipotriene, apply a thin layer of 5-fluorouracil followed by a thin layer of calcipotriene.   Avoid contact with your eyes, nostrils, and mouth. Do not use 5-fluorouracil/calcipotriene cream on infected or open wounds.   You will develop redness, irritation and some crusting at areas where you have pre-cancer damage/actinic keratoses. IF YOU DEVELOP PAIN, BLEEDING, OR SIGNIFICANT CRUSTING, STOP THE TREATMENT EARLY - you have already gotten a good response and the actinic keratoses should clear up well.  Wash your hands after applying 5-fluorouracil 5% cream on your skin.   A moisturizer or sunscreen with a minimum SPF 30 should be applied each morning.   Once you have finished the treatment, you can apply a thin layer of Vaseline twice a day to irritated areas to soothe and calm the areas more quickly. If you experience significant discomfort, contact your physician.  For some patients it is necessary to repeat the treatment for best results.  SIDE EFFECTS: When using 5-fluorouracil/calcipotriene cream, you may have mild irritation, such as redness, dryness, swelling, or a mild burning sensation. This usually resolves within 2 weeks. The more actinic keratoses you have, the more redness and inflammation you can expect during treatment. Eye irritation has been reported rarely. If this occurs, please let us know.  If you have any trouble using this cream, please call the office. If you have any other questions about this information, please do not hesitate to ask me before you  leave the office.   Cryotherapy Aftercare  Wash gently with soap and water everyday.   Apply Vaseline and Band-Aid daily until healed.      Due to recent changes in healthcare laws, you may see results of your pathology and/or laboratory studies on MyChart  before the doctors have had a chance to review them. We understand that in some cases there may be results that are confusing or concerning to you. Please understand that not all results are received at the same time and often the doctors may need to interpret multiple results in order to provide you with the best plan of care or course of treatment. Therefore, we ask that you please give [place] 2 business days to thoroughly review all your results before contacting the office for clarification. Should we see a critical lab result, you will be contacted sooner.   If You Need Anything After Your Visit  If you have any questions or concerns for your doctor, please call our main line at [phone] and press option 4 to reach your doctor's medical assistant. If no one answers, please leave a voicemail as directed and we will return your call as soon as possible. Messages left after 4 pm will be answered the following business day.   You may also send [place] a message via MyChart. We typically respond to MyChart messages within 1-2 business days.  For prescription refills, please ask your pharmacy to contact our office. Our fax number is [phone].  If you have an urgent issue when the clinic is closed that cannot wait until the next business day, you can page your doctor at the number below.    Please note that while we do our best to be available for urgent issues outside of office hours, we are not available 24/7.   If you have an urgent issue and are unable to reach [place], you may choose to seek medical care at your doctor's office, retail clinic, urgent care center, or emergency room.  If you have a medical emergency, please immediately call 911 or go to the emergency department.  Pager Numbers  - Dr. Gwen Pounds: [phone]  - Dr. Neale Burly: [phone]  - Dr. Roseanne Reno: [phone]  In the event of inclement weather, please call our main line at [phone] for an update on the status of any delays  or closures.  Dermatology Medication Tips: Please keep the boxes that topical medications come in in order to help keep track of the instructions about where and how to use these. Pharmacies typically print the medication instructions only on the boxes and not directly on the medication tubes.   If your medication is too expensive, please contact our office at [phone] option 4 or send [place] a message through MyChart.   We are unable to tell what your co-pay for medications will be in advance as this is different depending on your insurance coverage. However, we may be able to find a substitute medication at lower cost or fill out paperwork to get insurance to cover a needed medication.   If a prior authorization is required to get your medication covered by your insurance company, please allow [place] 1-2 business days to complete this process.  Drug prices often vary depending on where the prescription is filled and some pharmacies may offer cheaper prices.  The website www.goodrx.com contains coupons for medications through different pharmacies. The prices here do not account for what the cost may be with help from insurance (it may be cheaper  with your insurance), but the website can give you the price if you did not use any insurance.  - You can print the associated coupon and take it with your prescription to the pharmacy.  - You may also stop by our office during regular business hours and pick up a GoodRx coupon card.  - If you need your prescription sent electronically to a different pharmacy, notify our office through [facility] or by phone at [phone] option 4.     Si Usted Necesita Algo Despus de Su Visita  Tambin puede enviarnos un mensaje a travs de Clinical cytogeneticist. Por lo general respondemos a los mensajes de MyChart en el transcurso de 1 a 2 das hbiles.  Para renovar recetas, por favor pida a su farmacia que se ponga en contacto con nuestra oficina. Annie Sable de  fax es [place] [phone].  Si tiene un asunto urgente cuando la clnica est cerrada y que no puede esperar hasta el siguiente da hbil, puede llamar/localizar a su doctor(a) al nmero que aparece a continuacin.   Por favor, tenga en cuenta que aunque hacemos todo lo posible para estar disponibles para asuntos urgentes fuera del horario de [employer], no estamos disponibles las 24 horas del da, los 7 [address]  [address] de la [place].   Si tiene un problema urgente y no puede comunicarse con nosotros, puede optar por buscar atencin mdica  en el consultorio de su doctor(a), en una clnica privada, en un centro de atencin urgente o en una sala de emergencias.  Si tiene Engineer, drilling, por favor llame inmediatamente al 911 o vaya a la sala de emergencias.  Nmeros de bper  - Dr. Gwen Pounds: [phone]  - Dra. Moye: [phone]  - Dra. Roseanne Reno: [phone]  En caso de inclemencias del [place], por favor llame a Lacy Duverney principal al [phone] para una actualizacin sobre el [place] de cualquier retraso o cierre.  Consejos para la medicacin en dermatologa: Por favor, guarde las cajas en las que vienen los medicamentos de uso tpico para ayudarle a seguir las instrucciones sobre dnde y cmo usarlos. Las farmacias generalmente imprimen las instrucciones del medicamento slo en las cajas y no directamente en los tubos del [place].   Si su medicamento es muy caro, por favor, pngase en contacto con Rolm Gala llamando al [phone] y presione la opcin 4 o envenos un mensaje a travs de Clinical cytogeneticist.   No podemos decirle cul ser su copago por los medicamentos por adelantado ya que esto es diferente dependiendo de la cobertura de su seguro. Sin embargo, es posible que podamos encontrar un medicamento sustituto a Audiological scientist un formulario para que el seguro cubra el medicamento que se considera necesario.   Si se requiere una autorizacin previa para que su compaa de seguros  [place] su medicamento, por favor permtanos de 1 a 2 das hbiles para completar [address]  Los precios de los medicamentos varan con frecuencia dependiendo del Environmental consultant de dnde se surte la receta y alguna farmacias pueden ofrecer precios ms baratos.  El sitio web www.goodrx.com tiene cupones para medicamentos de Health and safety inspector. Los precios aqu no tienen en cuenta lo que podra costar con la ayuda del seguro (puede ser ms barato con su seguro), pero el sitio web puede darle el precio si no utiliz Tourist information centre manager.  - Puede imprimir el cupn correspondiente y llevarlo con su receta a la farmacia.  - Tambin puede pasar por nuestra oficina durante el horario de atencin regular y Librarian, academic  tarjeta de cupones de GoodRx.  - Si necesita que su receta se enve electrnicamente a una farmacia diferente, informe a nuestra oficina a travs de MyChart de [place] o por telfono llamando al [phone] y presione la opcin 4.

## 2022-07-14 ENCOUNTER — Encounter: Payer: Self-pay | Admitting: Dermatology

## 2022-07-21 ENCOUNTER — Emergency Department
Admission: EM | Admit: 2022-07-21 | Discharge: 2022-07-21 | Disposition: A | Discharge status: Home / Self Care | Payer: Medicare PPO | Attending: Emergency Medicine | Admitting: Emergency Medicine

## 2022-07-21 ENCOUNTER — Encounter: Payer: Self-pay | Admitting: Emergency Medicine

## 2022-07-21 ENCOUNTER — Other Ambulatory Visit: Payer: Self-pay

## 2022-07-21 ENCOUNTER — Emergency Department: Payer: Medicare PPO

## 2022-07-21 DIAGNOSIS — R109 Unspecified abdominal pain: Secondary | ICD-10-CM

## 2022-07-21 DIAGNOSIS — Z7901 Long term (current) use of anticoagulants: Secondary | ICD-10-CM | POA: Insufficient documentation

## 2022-07-21 DIAGNOSIS — I509 Heart failure, unspecified: Secondary | ICD-10-CM | POA: Insufficient documentation

## 2022-07-21 DIAGNOSIS — N132 Hydronephrosis with renal and ureteral calculous obstruction: Secondary | ICD-10-CM | POA: Insufficient documentation

## 2022-07-21 DIAGNOSIS — R1032 Left lower quadrant pain: Secondary | ICD-10-CM | POA: Diagnosis present

## 2022-07-21 DIAGNOSIS — N200 Calculus of kidney: Secondary | ICD-10-CM

## 2022-07-21 DIAGNOSIS — N189 Chronic kidney disease, unspecified: Secondary | ICD-10-CM | POA: Diagnosis not present

## 2022-07-21 LAB — COMPREHENSIVE METABOLIC PANEL
ALT: 14 U/L (ref 0–44)
AST: 20 U/L (ref 15–41)
Albumin: 4 g/dL (ref 3.5–5.0)
Alkaline Phosphatase: 67 U/L (ref 38–126)
Anion gap: 10 (ref 5–15)
BUN: 27 mg/dL — ABNORMAL HIGH (ref 8–23)
CO2: 25 mmol/L (ref 22–32)
Calcium: 9.1 mg/dL (ref 8.9–10.3)
Chloride: 102 mmol/L (ref 98–111)
Creatinine, Ser: 1.46 mg/dL — ABNORMAL HIGH (ref 0.61–1.24)
GFR, Estimated: 45 mL/min — ABNORMAL LOW (ref >60)
Glucose, Bld: 169 mg/dL — ABNORMAL HIGH (ref 70–99)
Potassium: 3.9 mmol/L (ref 3.5–5.1)
Sodium: 137 mmol/L (ref 135–145)
Total Bilirubin: 1.1 mg/dL (ref 0.3–1.2)
Total Protein: 6.6 g/dL (ref 6.5–8.1)

## 2022-07-21 LAB — CBC WITH DIFFERENTIAL/PLATELET
Abs Immature Granulocytes: 0.02 10*3/uL (ref 0.00–0.07)
Basophils Absolute: 0 10*3/uL (ref 0.0–0.1)
Basophils Relative: 0 %
Eosinophils Absolute: 0.2 10*3/uL (ref 0.0–0.5)
Eosinophils Relative: 2 %
HCT: 35.9 % — ABNORMAL LOW (ref 39.0–52.0)
Hemoglobin: 11.6 g/dL — ABNORMAL LOW (ref 13.0–17.0)
Immature Granulocytes: 0 %
Lymphocytes Relative: 19 %
Lymphs Abs: 1.5 10*3/uL (ref 0.7–4.0)
MCH: 30.9 pg (ref 26.0–34.0)
MCHC: 32.3 g/dL (ref 30.0–36.0)
MCV: 95.7 fL (ref 80.0–100.0)
Monocytes Absolute: 0.5 10*3/uL (ref 0.1–1.0)
Monocytes Relative: 6 %
Neutro Abs: 6 10*3/uL (ref 1.7–7.7)
Neutrophils Relative %: 73 %
Platelets: 142 10*3/uL — ABNORMAL LOW (ref 150–400)
RBC: 3.75 MIL/uL — ABNORMAL LOW (ref 4.22–5.81)
RDW: 13.6 % (ref 11.5–15.5)
WBC: 8.2 10*3/uL (ref 4.0–10.5)
nRBC: 0 % (ref 0.0–0.2)

## 2022-07-21 LAB — URINALYSIS, ROUTINE W REFLEX MICROSCOPIC
Bacteria, UA: NONE SEEN
Bilirubin Urine: NEGATIVE
Glucose, UA: NEGATIVE mg/dL
Ketones, ur: NEGATIVE mg/dL
Leukocytes,Ua: NEGATIVE
Nitrite: NEGATIVE
Protein, ur: NEGATIVE mg/dL
Specific Gravity, Urine: 1.014 (ref 1.005–1.030)
Squamous Epithelial / HPF: NONE SEEN /HPF (ref 0–5)
pH: 5 (ref 5.0–8.0)

## 2022-07-21 MED ORDER — OXYCODONE HCL 5 MG PO CAPS: 5.0000 mg | ORAL_CAPSULE | ORAL | 0 refills | Status: DC | PRN

## 2022-07-21 MED ORDER — TAMSULOSIN HCL 0.4 MG PO CAPS
0.4000 mg | ORAL_CAPSULE | Freq: Once | ORAL | Status: AC
  Administered 2022-07-21: 0.4 mg via ORAL
  Filled 2022-07-21: qty 1

## 2022-07-21 MED ORDER — FENTANYL CITRATE PF 50 MCG/ML IJ SOSY
50.0000 ug | PREFILLED_SYRINGE | Freq: Once | INTRAMUSCULAR | Status: AC
  Administered 2022-07-21: 50 ug via INTRAVENOUS
  Filled 2022-07-21: qty 1

## 2022-07-21 MED ORDER — ONDANSETRON HCL 4 MG PO TABS: 4.0000 mg | ORAL_TABLET | Freq: Four times a day (QID) | ORAL | 0 refills | Status: AC | PRN

## 2022-07-21 MED ORDER — SODIUM CHLORIDE 0.9 % IV BOLUS
250.0000 mL | Freq: Once | INTRAVENOUS | Status: AC
  Administered 2022-07-21: 250 mL via INTRAVENOUS

## 2022-07-21 MED ORDER — ONDANSETRON HCL 4 MG/2ML IJ SOLN
4.0000 mg | Freq: Once | INTRAMUSCULAR | Status: AC
  Administered 2022-07-21: 4 mg via INTRAVENOUS
  Filled 2022-07-21: qty 2

## 2022-07-21 MED ORDER — ACETAMINOPHEN 500 MG PO TABS
1000.0000 mg | ORAL_TABLET | Freq: Once | ORAL | Status: AC
  Administered 2022-07-21: 1000 mg via ORAL
  Filled 2022-07-21: qty 2

## 2022-07-21 MED ORDER — TAMSULOSIN HCL 0.4 MG PO CAPS: 0.4000 mg | ORAL_CAPSULE | Freq: Every day | ORAL | 0 refills | Status: AC

## 2022-07-21 NOTE — Discharge Instructions (Signed)
You were seen in the emergency department today for evaluation of your flank pain.  Your CT scan did show that you have a kidney stone that I suspect is the likely cause of your pain.  Fortunately your urine did not look infected.  You can take Tylenol to help with your pain.  If you have breakthrough pain, I have sent a short course of narcotic pain medicine to your pharmacy.  This can make you drowsy, so do not drive or operate machinery when taking this.  In addition, I have sent a nausea medicine as well as a medicine that can help pass your stone called Flomax.  Follow-up with a urologist in the next 1 to 2 weeks for reevaluation.  Return to the ER for new or worsening symptoms including uncontrolled pain despite pain medication, inability to tolerate food or liquids, fevers, or any other new or concerning symptoms.

## 2022-07-21 NOTE — ED Triage Notes (Signed)
Patient ambulatory to triage with steady gait, without difficulty or distress noted; pt reports awoke about midnight with left flank pain nonradiating accomp by nausea; denies hx

## 2022-07-21 NOTE — ED Provider Notes (Signed)
Care of this patient assumed from prior physician at 0700 pending symptom reevaluation and disposition. Please see prior physician note for further details.  Recently, this is a 89 year old male with history of renal stones who presented with left flank pain starting around midnight.  Imaging demonstrated a 4 mm stone in the left ureter with mild hydronephrosis.  No fever or infectious symptoms.  Normal white blood cell count.  UA without evidence of infection.  Patient was given analgesia and a small fluid bolus.  Signed out to me pending reevaluation, anticipated disposition if symptoms are controlled.  On reevaluation, patient reports his pain is significantly improved.  Initially reports it was a 10, now a 2.  He does feel comfortable going home.  He does not feel he needs further pain medication currently.  Will DC with prescription for Zofran, Flomax, as needed oxycodone for breakthrough pain, and urology follow-up.  As the patient is on Eliquis, will hold off on having him take NSAIDs regularly but did recommend regular Tylenol use.  Strict return precautions provided.  Patient discharged  in stable condition.    Trinna Post, MD 07/21/22 1630

## 2022-07-21 NOTE — ED Provider Notes (Signed)
[facility] Provider Note    Event Date/Time   First MD Initiated Contact with Patient 07/21/22 [phone]     (approximate)   History   Flank Pain   HPI  [name] is a 89 y.o. male  here with left flank pain. Pt reports he woke up around 12 PM with severe, aching, left flank pain that has persisted since onset. States its some of the worst pain he's ever felt. He says he has had some radiation down toward the LLQ but most remains in the L flank. He is s/p recent appendectomy, said he had been recovering well. BMs have been normal. He felt normal throughout the day yesterday. He has a h/o kidney stones which he has been able to pass on his own.        Physical Exam   Triage Vital Signs: ED Triage Vitals  Enc Vitals Group     BP 07/21/22 0456 (!) 166/80     Pulse Rate 07/21/22 0456 86     Resp 07/21/22 0456 18     Temp 07/21/22 0456 97.8 F (36.6 C)     Temp Source 07/21/22 0456 Oral     SpO2 07/21/22 0456 98 %     Weight 07/21/22 0444 150 lb (68 kg)     Height 07/21/22 0444 6' (1.829 m)     Head Circumference --      Peak Flow --      Pain Score 07/21/22 0444 10     Pain Loc --      Pain Edu? --      Excl. in GC? --     Most recent vital signs: Vitals:   07/21/22 0800 07/21/22 0830  BP: (!) 168/70 (!) 143/69  Pulse: (!) 47   Resp: 13 13  Temp:    SpO2: 100% 100%     General: Awake, no distress.  CV:  Good peripheral perfusion.  Resp:  Normal work of breathing.  Abd:  No distention. Mild L flank TTP but no overt CVAT. Abdomen is o/w soft, nondistended. Surgical sites c/d/I. Other:  MMM.   ED Results / Procedures / Treatments   Labs (all labs ordered are listed, but only abnormal results are displayed) Labs Reviewed  CBC WITH DIFFERENTIAL/PLATELET - Abnormal; Notable for the following components:      Result Value   RBC 3.75 (*)    Hemoglobin 11.6 (*)    HCT 35.9 (*)    Platelets 142 (*)    All other components within  normal limits  COMPREHENSIVE METABOLIC PANEL - Abnormal; Notable for the following components:   Glucose, Bld 169 (*)    BUN 27 (*)    Creatinine, Ser 1.46 (*)    GFR, Estimated 45 (*)    All other components within normal limits  URINALYSIS, ROUTINE W REFLEX MICROSCOPIC - Abnormal; Notable for the following components:   Color, Urine YELLOW (*)    APPearance CLEAR (*)    Hgb urine dipstick MODERATE (*)    All other components within normal limits     EKG    RADIOLOGY CT Stone; 4 mm stone middle third of left ureter, mild proximal hydro, otherwise largely chronic changes/no acute issues   I also independently reviewed and agree with radiologist interpretations.   PROCEDURES:  Critical Care performed: No   MEDICATIONS ORDERED IN ED: Medications  ondansetron (ZOFRAN) injection 4 mg (4 mg Intravenous Given 07/21/22 0627)  fentaNYL (SUBLIMAZE) injection 50  mcg (50 mcg Intravenous Given 07/21/22 0628)  acetaminophen (TYLENOL) tablet 1,000 mg (1,000 mg Oral Given 07/21/22 0627)  tamsulosin (FLOMAX) capsule 0.4 mg (0.4 mg Oral Given 07/21/22 0639)  sodium chloride 0.9 % bolus 250 mL (0 mLs Intravenous Stopped 07/21/22 0731)     IMPRESSION / MDM / ASSESSMENT AND PLAN / ED COURSE  I reviewed the triage vital signs and the nursing notes.                              Differential diagnosis includes, but is not limited to, renal colic, constipation, ileus, obstruction, diverticulitis, AAA/dissection, MSK pain, shingles  Patient's presentation is most consistent with acute presentation with potential threat to life or bodily function.  The patient is on the cardiac monitor to evaluate for evidence of arrhythmia and/or significant heart rate changes  89 yo M here with L flank pain. Suspect renal colic 2/2 4 mm stone in left ureter with mild hydro. He has no fever, no infectious symptoms. He is s/p recent surgery in May but seems to be recovering well from this, and is having normal  BMs. CBC shows no leukocytosis. CMP with baseline mild CKD. UA with hematuria but no pyuria or signs of infection.  Will give small amount of fluid in setting of his CHF, analgesia, and flomax with plan to reassess. Pt is overall well appearing and likely can be managed as outpt if pain is controlled. Pt is on eliquis, may tolerate one dose of NSAIDs if needed but would hold on repeated doses at home.    FINAL CLINICAL IMPRESSION(S) / ED DIAGNOSES   Final diagnoses:  Left flank pain  Kidney stone     Rx / DC Orders   ED Discharge Orders          Ordered    tamsulosin (FLOMAX) 0.4 MG CAPS capsule  Daily        07/21/22 0819    ondansetron (ZOFRAN) 4 MG tablet  Every 6 hours PRN        07/21/22 0819    oxycodone (OXY-IR) 5 MG capsule  Every 4 hours PRN        07/21/22 8469             Note:  This document was prepared using Dragon voice recognition software and may include unintentional dictation errors.   Shaune Pollack, MD 07/21/22 [phone]

## 2022-07-27 ENCOUNTER — Ambulatory Visit: Payer: Self-pay | Admitting: Urology

## 2022-08-01 ENCOUNTER — Other Ambulatory Visit: Payer: Self-pay

## 2022-08-01 DIAGNOSIS — N200 Calculus of kidney: Secondary | ICD-10-CM

## 2022-08-02 ENCOUNTER — Other Ambulatory Visit
Admission: RE | Admit: 2022-08-02 | Discharge: 2022-08-02 | Disposition: A | Discharge status: Home / Self Care | Payer: Medicare PPO | Attending: Urology | Admitting: Urology

## 2022-08-02 ENCOUNTER — Encounter: Payer: Self-pay | Admitting: Urology

## 2022-08-02 ENCOUNTER — Ambulatory Visit (INDEPENDENT_AMBULATORY_CARE_PROVIDER_SITE_OTHER): Payer: Medicare PPO | Admitting: Urology

## 2022-08-02 VITALS — Ht 72.0 in | Wt 155.0 lb

## 2022-08-02 DIAGNOSIS — R310 Gross hematuria: Secondary | ICD-10-CM

## 2022-08-02 DIAGNOSIS — N200 Calculus of kidney: Secondary | ICD-10-CM | POA: Insufficient documentation

## 2022-08-02 DIAGNOSIS — N201 Calculus of ureter: Secondary | ICD-10-CM

## 2022-08-02 LAB — URINALYSIS, COMPLETE (UACMP) WITH MICROSCOPIC
Bilirubin Urine: NEGATIVE
Glucose, UA: 100 mg/dL — AB
Ketones, ur: NEGATIVE mg/dL
Leukocytes,Ua: NEGATIVE
Nitrite: NEGATIVE
Protein, ur: NEGATIVE mg/dL
Specific Gravity, Urine: 1.02 (ref 1.005–1.030)
pH: 7 (ref 5.0–8.0)

## 2022-08-02 NOTE — Progress Notes (Signed)
08/02/22 10:44 AM   [name] 05-Jun-1930 [id number]  CC: Left ureteral stone, gross hematuria  HPI: 89 year old male who presented to the ER on 07/21/2018 for left-sided flank pain, and workup with CT scan showed a 4 mm left proximal ureteral stone.  Urinalysis showed microscopic hematuria but was otherwise benign, renal function was at baseline, and he was discharged with medical expulsive therapy.  He also had an episode of gross hematuria associated with renal colic that has since resolved.  He denies any pain today, and has been feeling well since he was discharged from the ER.  Urinalysis today with microscopic hematuria with 11-20 RBC, otherwise benign  He also underwent recent robotic appendectomy on 06/22/2022 with Dr. Hazle Quant.   PMH: Past Medical History:  Diagnosis Date   AAA (abdominal aortic aneurysm) (HCC)    Actinic keratosis    Arthritis    in fingers and hands   Basal cell carcinoma 09/01/2021   Right medial canthus/lower eyelid. Nodular BCC. Excixed 09/01/2021   Cancer (HCC)    skin cancer removed several   Diabetes mellitus without complication (HCC)    Dysrhythmia    atrial fibrillation.  on eliquis   History of kidney stones    Hx of basal cell carcinoma 06/30/2016   L proximal mandible   Hx of basal cell carcinoma 07/19/2016   L medial lower eyelid   Hx of basal cell carcinoma 09/20/2016   R upper back lateral   Hx of basal cell carcinoma 12/01/2016   L post shoulder sup medial scapula   Hx of basal cell carcinoma 12/01/2016   L forehead, 2.0cm above med brow   Hx of basal cell carcinoma 12/17/2018   L forehead aboove medial brow   Hx of melanoma in situ 08/16/2016   L proximal mandible/infra auricular   Hypertension    Mitral insufficiency    per dr. Juliann Pares   Myocardial infarct [facility]) 1990   Squamous cell carcinoma of skin 11/01/2017   R superior sideburn in hairline   Squamous cell carcinoma of skin 01/03/2018   L medial dorsum  wrist lateral and medial   Squamous cell carcinoma of skin 01/03/2018   L proximal thumb   Squamous cell carcinoma of skin 04/12/2018   L dorsum proximal index finger   Squamous cell carcinoma of skin 04/12/2018   L dorsum medial proximal thumb   Squamous cell carcinoma of skin 07/04/2018   R superior sideburn   Squamous cell carcinoma of skin 07/04/2018   L dorsum proximal index finger   Squamous cell carcinoma of skin 11/13/2018   R helix   Squamous cell carcinoma of skin 03/05/2020   scalp   Transfusion history 1990   received several units of blood after CABG    Surgical History: Past Surgical History:  Procedure Laterality Date   ABDOMINAL AORTIC ANEURYSM REPAIR  2005, [facility]   BRAIN SURGERY     Cardiac bypass  1990   [facility]. triple cabg   CARDIAC CATHETERIZATION  1990   stents placed but ultimately clotted and shut down.   CARDIAC SURGERY  1990   duplicate entry   CHOLECYSTECTOMY N/A 11/10/2008   Dr Lemar Livings   CORONARY ARTERY BYPASS GRAFT  1990   x 3. performed after coronary stents failed   ENDARTERECTOMY FEMORAL Right 05/29/2019   Procedure: ENDARTERECTOMY FEMORAL;  Surgeon: Annice Needy, MD;  Location: [facility] ORS;  Service: Vascular;  Laterality: Right;   EYE SURGERY Bilateral    cataract  extractions with iol   INGUINAL HERNIA REPAIR Left 08/07/2017   Medium Ultra Pro mesh Surgeon: Earline Mayotte, MD;  Location: [facility] ORS;  Service: General;  Laterality: Left;   INSERTION OF ILIAC STENT Right 05/29/2019   Procedure: INSERTION OF ILIAC STENT ( AND SFA STENT);  Surgeon: Annice Needy, MD;  Location: [facility] OR[facility];  Service: Vascular;  Laterality: Right;   LOWER EXTREMITY ANGIOGRAPHY Right 05/27/2019   Procedure: LOWER EXTREMITY ANGIOGRAPHY;  Surgeon: Annice Needy, MD;  Location: [facility] INVASIVE CV LAB;  Service: Cardiovascular;  Laterality: Right;   XI ROBOTIC LAPAROSCOPIC ASSISTED APPENDECTOMY N/A 06/22/2022   Procedure: XI ROBOTIC LAPAROSCOPIC ASSISTED APPENDECTOMY;  Surgeon:  Carolan Shiver, MD;  Location: [facility] ORS;  Service: General;  Laterality: N/A;     Family History: Family History  Problem Relation Age of Onset   Cerebral aneurysm Mother        died at 80   CAD Father        died at 2    Social History:  reports that he quit smoking about 34 years ago. His smoking use included cigarettes. He has been exposed to tobacco smoke. He has never used smokeless tobacco. He reports that he does not drink alcohol and does not use drugs.  Physical Exam: Ht 6' (1.829 m)   Wt 155 lb (70.3 kg)   BMI 21.02 kg/m    Constitutional:  Alert and oriented, No acute distress. Cardiovascular: No clubbing, cyanosis, or edema. Respiratory: Normal respiratory effort, no increased work of breathing. GI: Abdomen is soft, nontender, nondistended, no abdominal masses   Pertinent Imaging: I have personally viewed and interpreted the CT scans dated 06/21/2022 and 07/21/2022.  07/21/2022 CT shows new 4 mm left mid ureteral stone with hydronephrosis, this was not present on the scan from May 2024.  Assessment & Plan:   89 year old male with 4 mm left mid ureteral stone, no symptoms since discharge from ER at end of May, persistent microscopic hematuria on urinalysis.  We discussed various treatment options for urolithiasis including observation with or without medical expulsive therapy, shockwave lithotripsy (SWL), ureteroscopy and laser lithotripsy with stent placement, and percutaneous nephrolithotomy.  We discussed that management is based on stone size, location, density, patient co-morbidities, and patient preference. Stones <86mm in size have a >80% spontaneous passage rate. Data surrounding the use of tamsulosin for medical expulsive therapy is controversial, but meta analyses suggests it is most efficacious for distal stones between 5-94mm in size. Possible side effects include dizziness/lightheadedness, and retrograde ejaculation.SWL has a lower stone free rate in a  single procedure, but also a lower complication rate compared to ureteroscopy and avoids a stent and associated stent related symptoms. Possible complications include renal hematoma, steinstrasse, and need for additional treatment.Ureteroscopy with laser lithotripsy and stent placement has a higher stone free rate than SWL in a single procedure, however increased complication rate including possible infection, ureteral injury, bleeding, and stent related morbidity. Common stent related symptoms include dysuria, urgency/frequency, and flank pain.  After an extensive discussion of the risks and benefits of the above treatment options, the patient would like to proceed with ongoing medical expulsive therapy with Flomax, return precautions were discussed at length, RTC 2 to 3 weeks symptom check, sooner if problems.   Legrand Rams, MD 08/02/2022  [facility] [address], [address] [place], [place] [address] [phone]

## 2022-08-04 ENCOUNTER — Telehealth: Payer: Self-pay

## 2022-08-04 DIAGNOSIS — R3129 Other microscopic hematuria: Secondary | ICD-10-CM

## 2022-08-04 NOTE — Telephone Encounter (Signed)
-----   Message from Sondra Come sent at 08/02/2022 12:38 PM EDT ----- Still has some persistent microscopic blood on urine sample today.  Recommend follow-up with PA in 3 to 4 weeks for UA and symptom check, confirm stone passage  Legrand Rams, MD 08/02/2022

## 2022-08-04 NOTE — Telephone Encounter (Signed)
Called pt informed him of the information below. Pt voiced understanding. UA ordered. Appt scheduled.

## 2022-08-10 ENCOUNTER — Ambulatory Visit: Payer: Self-pay | Admitting: Urology

## 2022-08-26 NOTE — Progress Notes (Signed)
08/29/2022 4:17 PM   [name] 07-01-1930 [id number]  Referring provider: Gracelyn Nurse, MD [address] [place],  [place] [address]  Urological history: 1. Nephrolithiasis -CT renal stone study (06/2022) -  4 mm obstructive calculus in the middle third of the left ureter with mild proximal left hydroureteronephrosis.  Multiple nonobstructive calculi are noted elsewhere in the collecting systems of both kidneys measuring 2-4 mm in size.   Chief Complaint  Patient presents with   Nephrolithiasis    HPI: [name] is a 89 y.o. male who presents today for follow up.    Previous records reviewed.   He was seen by Dr. Richardo Hanks on 08/02/2022 for ED follow up after being seen for left flank pain and gross heme.  The CT showed a 4 mm left proximal ureteral stone.  His UA still had micro heme at that visit.    He has been feeling well.  He has not had any further left-sided pain.  Patient denies any modifying or aggravating factors.  Patient denies any recent UTI's, gross hematuria, dysuria or suprapubic/flank pain.  Patient denies any fevers, chills, nausea or vomiting.   UA yellow hazy, specific gravity 1.020, pH 5.5 and bacteriuria  KUB no stones seen    PMH: Past Medical History:  Diagnosis Date   AAA (abdominal aortic aneurysm) (HCC)    Actinic keratosis    Appendicitis    Arthritis    Atrial fibrillation (HCC)    Basal cell carcinoma 09/01/2021   Right medial canthus/lower eyelid. Nodular BCC. Excixed 09/01/2021   BPH (benign prostatic hyperplasia)    CKD (chronic kidney disease), stage III (HCC)    Coronary artery disease 1990   Dysrhythmia    atrial fibrillation.  on eliquis   GERD (gastroesophageal reflux disease)    Heart failure with reduced ejection fraction (HCC)    Hiatal hernia    History of kidney stones    HLD (hyperlipidemia)    HTN (hypertension)    Hx of basal cell carcinoma 06/30/2016   L proximal mandible   Hx of basal cell  carcinoma 07/19/2016   L medial lower eyelid   Hx of basal cell carcinoma 09/20/2016   R upper back lateral   Hx of basal cell carcinoma 12/01/2016   L post shoulder sup medial scapula   Hx of basal cell carcinoma 12/01/2016   L forehead, 2.0cm above med brow   Hx of basal cell carcinoma 12/17/2018   L forehead aboove medial brow   Hx of CABG    x 3   Hx of melanoma in situ 08/16/2016   L proximal mandible/infra auricular   Hypertension    Hypogonadism in male    Ischemic heart disease    Kidney stone    Left inguinal hernia    Myocardial infarct (HCC) 1990   On apixaban therapy    Peripheral vascular disease (HCC)    PUD (peptic ulcer disease)    Spinal stenosis    Squamous cell carcinoma of skin 11/01/2017   R superior sideburn in hairline   Squamous cell carcinoma of skin 01/03/2018   L medial dorsum wrist lateral and medial   Squamous cell carcinoma of skin 01/03/2018   L proximal thumb   Squamous cell carcinoma of skin 04/12/2018   L dorsum proximal index finger   Squamous cell carcinoma of skin 04/12/2018   L dorsum medial proximal thumb   Squamous cell carcinoma of skin 07/04/2018  R superior sideburn   Squamous cell carcinoma of skin 07/04/2018   L dorsum proximal index finger   Squamous cell carcinoma of skin 11/13/2018   R helix   Squamous cell carcinoma of skin 03/05/2020   scalp   Stage 3b chronic kidney disease (HCC)    T2DM (type 2 diabetes mellitus) (HCC)    TIA (transient ischemic attack)    Transfusion history 1990   received several units of blood after CABG   Transient cerebral ischemia     Surgical History: Past Surgical History:  Procedure Laterality Date   ABDOMINAL AORTIC ANEURYSM REPAIR  2005, [facility]   APPENDECTOMY     BRAIN SURGERY     Cardiac bypass  69   [facility]. triple cabg   CARDIAC CATHETERIZATION  1990   stents placed but ultimately clotted and shut down.   CARDIAC SURGERY  1990   duplicate entry   CHOLECYSTECTOMY N/A  11/10/2008   Dr Lemar Livings   CORONARY ARTERY BYPASS GRAFT  1990   x 3. performed after coronary stents failed   ENDARTERECTOMY FEMORAL Right 05/29/2019   Procedure: ENDARTERECTOMY FEMORAL;  Surgeon: Annice Needy, MD;  Location: [facility] OR[facility];  Service: Vascular;  Laterality: Right;   EYE SURGERY Bilateral    cataract extractions with iol   INGUINAL HERNIA REPAIR Left 08/07/2017   Medium Ultra Pro mesh Surgeon: Earline Mayotte, MD;  Location: [facility];  Service: General;  Laterality: Left;   INSERTION OF ILIAC STENT Right 05/29/2019   Procedure: INSERTION OF ILIAC STENT ( AND SFA STENT);  Surgeon: Annice Needy, MD;  Location: [facility];  Service: Vascular;  Laterality: Right;   LOWER EXTREMITY ANGIOGRAPHY Right 05/27/2019   Procedure: LOWER EXTREMITY ANGIOGRAPHY;  Surgeon: Annice Needy, MD;  Location: [facility];  Service: Cardiovascular;  Laterality: Right;   XI ROBOTIC LAPAROSCOPIC ASSISTED APPENDECTOMY N/A 06/22/2022   Procedure: XI ROBOTIC LAPAROSCOPIC ASSISTED APPENDECTOMY;  Surgeon: Carolan Shiver, MD;  Location: [facility];  Service: General;  Laterality: N/A;    Home Medications:  Allergies as of 08/29/2022       Reactions   Morphine And Codeine Nausea And Vomiting   Sulfa Antibiotics Rash        Medication List        Accurate as of August 29, 2022 11:59 PM. If you have any questions, ask your nurse or doctor.          STOP taking these medications    oxycodone 5 MG capsule Commonly known as: OXY-IR Stopped by: Lisel Siegrist       TAKE these medications    acetaminophen 325 MG tablet Commonly known as: TYLENOL Take 2 tablets (650 mg total) by mouth every 6 (six) hours as needed for mild pain or fever.   atorvastatin 40 MG tablet Commonly known as: LIPITOR Take 40 mg by mouth daily.   Bayer Contour Test test strip Generic drug: glucose blood 1 strip by Does not apply route 3 (three) times daily.   colchicine 0.6 MG tablet Take 0.6 mg by  mouth.   diltiazem 180 MG 24 hr capsule Commonly known as: CARDIZEM CD Take 180 mg by mouth daily.   Eliquis 5 MG Tabs tablet Generic drug: apixaban Take 1 tablet (5 mg total) by mouth 2 (two) times daily.   GoodSense Eye Drops 0.05 % ophthalmic solution Generic drug: tetrahydrozoline Place 1 drop into both eyes daily as needed (dry eyes).   lisinopril-hydrochlorothiazide 20-12.5 MG tablet Commonly known  as: ZESTORETIC Take 1 tablet by mouth 2 (two) times daily.   Vitamin D3 125 MCG (5000 UT) Caps Take 5,000 Units by mouth daily.        Allergies:  Allergies  Allergen Reactions   Morphine And Codeine Nausea And Vomiting   Sulfa Antibiotics Rash    Family History: Family History  Problem Relation Age of Onset   Cerebral aneurysm Mother        died at 104   CAD Father        died at 17    Social History:  reports that he quit smoking about 34 years ago. His smoking use included cigarettes. He started smoking about 64 years ago. He has been exposed to tobacco smoke. He has never used smokeless tobacco. He reports that he does not drink alcohol and does not use drugs.  ROS: Pertinent ROS in HPI  Physical Exam: BP (!) 159/86 (BP Location: Left Arm, Patient Position: Sitting, Cuff Size: Normal)   Pulse 81   Ht 6' (1.829 m)   Wt 146 lb 12.8 oz (66.6 kg)   BMI 19.91 kg/m   Constitutional:  Well nourished. Alert and oriented, No acute distress. HEENT: [place] AT, moist mucus membranes.  Trachea midline Cardiovascular: No clubbing, cyanosis, or edema. Respiratory: Normal respiratory effort, no increased work of breathing. Neurologic: Grossly intact, no focal deficits, moving all 4 extremities. Psychiatric: Normal mood and affect.  Laboratory Data: Lab Results  Component Value Date   WBC 8.2 07/21/2022   HGB 11.6 (L) 07/21/2022   HCT 35.9 (L) 07/21/2022   MCV 95.7 07/21/2022   PLT 142 (L) 07/21/2022    Lab Results  Component Value Date   CREATININE 1.46 (H)  07/21/2022   Lab Results  Component Value Date   AST 20 07/21/2022   Lab Results  Component Value Date   ALT 14 07/21/2022   Urinalysis Component     Latest Ref Rng 08/29/2022  Color, Urine     YELLOW  YELLOW   Appearance     CLEAR  HAZY !   Specific Gravity, Urine     1.005 - 1.030  1.020   pH     5.0 - 8.0  5.5   Glucose, UA     NEGATIVE mg/dL NEGATIVE   Hgb urine dipstick     NEGATIVE  NEGATIVE   Bilirubin Urine     NEGATIVE  NEGATIVE   Ketones, ur     NEGATIVE mg/dL NEGATIVE   Protein     NEGATIVE mg/dL NEGATIVE   Nitrite     NEGATIVE  NEGATIVE   Leukocytes,Ua     NEGATIVE  NEGATIVE   RBC / HPF     0 - 5 RBC/hpf 0-5   WBC, UA     0 - 5 WBC/hpf 0-5   Bacteria, UA     NONE SEEN  MANY !   Squamous Epithelial / HPF     0 - 5 /HPF NONE SEEN     Legend: ! Abnormal I have reviewed the labs.   Pertinent Imaging: CLINICAL DATA:  History of kidney stones with difficulty urinating   EXAM: ABDOMEN - 1 VIEW   COMPARISON:  07/21/2022   FINDINGS: Scattered large and small bowel gas is noted. Changes consistent with prior arterial stenting on the right are seen. Fusiform aneurysmal dilatation of the juxtarenal abdominal aorta is again noted. No free air is seen. No obstructive changes are noted. Calcification is noted just to the  left of the midline in the pelvis which may represent a distal left ureteral stone migrated from the mid ureter seen on prior CT. No other stones are noted.   IMPRESSION: Calcification in the left hemipelvis as described suspicious for interval migration of previously seen ureteral stone on the left.     Electronically Signed   By: Alcide Clever M.D.   On: 09/03/2022 03:21   I have independently reviewed the films.  See HPI.  After second review and discussing it with colleagues it does appear that the calcification in the pelvis even though resembles a phlebolith is likely a distal left ureteral stone  Assessment & Plan:    1.  Nephrolithiasis -UA negative for micro  heme -KUB distal left ureteral stone   2. Gross hematuria -UA negative for micro heme  Return for repeat KUB .  These notes generated with voice recognition software. I apologize for typographical errors.  Cloretta Ned  [facility] [address]  [address] [place], [place] [address] [phone]

## 2022-08-29 ENCOUNTER — Ambulatory Visit
Admission: RE | Admit: 2022-08-29 | Discharge: 2022-08-29 | Disposition: A | Discharge status: Home / Self Care | Payer: Medicare PPO | Source: Ambulatory Visit | Attending: Urology | Admitting: Urology

## 2022-08-29 ENCOUNTER — Encounter: Payer: Self-pay | Admitting: Urology

## 2022-08-29 ENCOUNTER — Other Ambulatory Visit
Admission: RE | Admit: 2022-08-29 | Discharge: 2022-08-29 | Disposition: A | Discharge status: Home / Self Care | Payer: Medicare PPO | Source: Home / Self Care | Attending: Urology | Admitting: Urology

## 2022-08-29 ENCOUNTER — Ambulatory Visit: Payer: Medicare PPO | Admitting: Urology

## 2022-08-29 ENCOUNTER — Ambulatory Visit
Admission: RE | Admit: 2022-08-29 | Discharge: 2022-08-29 | Disposition: A | Discharge status: Home / Self Care | Payer: Medicare PPO | Attending: Urology | Admitting: Urology

## 2022-08-29 ENCOUNTER — Other Ambulatory Visit: Payer: Self-pay

## 2022-08-29 VITALS — BP 159/86 | HR 81 | Ht 72.0 in | Wt 146.8 lb

## 2022-08-29 DIAGNOSIS — N200 Calculus of kidney: Secondary | ICD-10-CM

## 2022-08-29 DIAGNOSIS — N201 Calculus of ureter: Secondary | ICD-10-CM

## 2022-08-29 DIAGNOSIS — R3129 Other microscopic hematuria: Secondary | ICD-10-CM | POA: Insufficient documentation

## 2022-08-29 DIAGNOSIS — R310 Gross hematuria: Secondary | ICD-10-CM

## 2022-08-29 LAB — URINALYSIS, COMPLETE (UACMP) WITH MICROSCOPIC
Bilirubin Urine: NEGATIVE
Glucose, UA: NEGATIVE mg/dL
Hgb urine dipstick: NEGATIVE
Ketones, ur: NEGATIVE mg/dL
Leukocytes,Ua: NEGATIVE
Nitrite: NEGATIVE
Protein, ur: NEGATIVE mg/dL
Specific Gravity, Urine: 1.02 (ref 1.005–1.030)
Squamous Epithelial / HPF: NONE SEEN /HPF (ref 0–5)
pH: 5.5 (ref 5.0–8.0)

## 2022-10-13 ENCOUNTER — Ambulatory Visit: Payer: Self-pay | Admitting: General Surgery

## 2022-10-13 NOTE — H&P (Signed)
PATIENT PROFILE: [name] is a 89 y.o. male who presents to the Clinic for consultation at the request of Dr. Letitia Libra for evaluation of inguinal hernia.  PCP:  Enzo Montgomery, MD  HISTORY OF PRESENT ILLNESS: [name] reports he has been feeling a lump in the right inguinal hernia.  He endorses that he felt it when he sneezed.  He was painful.  He endorses that he has been feeling pain in the right groin.  No pain radiation.  Pain aggravated by coughing or sneezing.  Alleviating factor is resting and reducing the hernia.  Patient history of bilateral inguinal hernia repair many years ago.  Denies any episode of abdominal distention, nausea or vomiting.   PROBLEM LIST: Problem List  Date Reviewed: 08/15/2022          Noted   PVD (peripheral vascular disease) (CMS-HCC) 08/14/2019   Stage 3b chronic kidney disease (CMS/HHS-HCC) 08/14/2019   Atherosclerosis of native arteries of extremity with rest pain (CMS/HHS-HCC) 05/17/2019   Overview    Last Assessment & Plan:  Formatting of this note might be different from the original. Patient's wrist pain is resolved after extensive revascularization last month.  Overall he is slowly improving.  His reperfusion swelling is improving but still significant.  We discussed elevation and compression as well as increasing his activity.  We will plan to see him back in 3 months with noninvasive studies for follow-up.      Left inguinal hernia 07/18/2017   Transient cerebral ischemia 05/23/2016   Atrial fibrillation, new onset (CMS/HHS-HCC) 05/23/2016   Type 2 diabetes with nephropathy (CMS/HHS-HCC) 06/04/2015   Abdominal aortic aneurysm without rupture (CMS-HCC) 06/04/2015   Overview    Repair 2005      Hypertension 11/28/2013   Ischemic heart disease 11/28/2013   Overview    with previous CABG procedure in 1990      Hyperlipidemia 11/28/2013   Spinal stenosis 11/28/2013    GENERAL REVIEW OF SYSTEMS:   General ROS: negative for - chills,  fatigue, fever, weight gain or weight loss Allergy and Immunology ROS: negative for - hives  Hematological and Lymphatic ROS: negative for - bleeding problems or bruising, negative for palpable nodes Endocrine ROS: negative for - heat or cold intolerance, hair changes Respiratory ROS: negative for - cough, shortness of breath or wheezing Cardiovascular ROS: no chest pain or palpitations GI ROS: negative for nausea, vomiting, abdominal pain, diarrhea, constipation Musculoskeletal ROS: negative for - joint swelling or muscle pain Neurological ROS: negative for - confusion, syncope Dermatological ROS: negative for pruritus and rash Psychiatric: negative for anxiety, depression, difficulty sleeping and memory loss  MEDICATIONS: Current Outpatient Medications  Medication Sig Dispense Refill   ACCU-CHEK GUIDE GLUCOSE METER Misc 1 each as directed 1 each 0   ACCU-CHEK GUIDE TEST STRIPS test strip 1 each (1 strip total) 3 (three) times daily Use as instructed. 100 each 5   acetaminophen (TYLENOL) 325 MG tablet Take by mouth     apixaban (ELIQUIS) 5 mg tablet TAKE 1 TABLET BY MOUTH EVERY 12 HOURS 180 tablet 3   atorvastatin (LIPITOR) 40 MG tablet Take 1 tablet (40 mg total) by mouth every evening 90 tablet 3   blood glucose diagnostic (ACCU-CHEK AVIVA PLUS TEST STRP) test strip 1 each (1 strip total) by XX route 3 (three) times daily Dx E11.9 100 each 5   blood glucose diagnostic test strip USE TO CHECK BLOOD SUGAR THREE TIMES DAILY AS INSTRUCTED 100 each 0   blood-glucose  meter (ACCU-CHEK AVIVA PLUS METER) Misc 1 each by XX route as directed. Dx E11.9 1 each 0   cholecalciferol (VITAMIN D3) 1,000 unit capsule Take 1,000 Units by mouth once daily.     colchicine (COLCRYS) 0.6 mg tablet Take 1 daily as need for gout. 10 tablet 0   cyanocobalamin (VITAMIN B12) 100 MCG tablet Take 100 mcg by mouth once daily.     dilTIAZem (CARDIZEM CD) 180 MG CD capsule Take 1 capsule (180 mg total) by mouth once  daily 30 capsule 11   lancets Use 1 each 3 (three) times daily Compatible with Accu Chek guide 100 each 5   lisinopriL-hydroCHLOROthiazide (ZESTORETIC) 20-12.5 mg tablet Take 1 tablet by mouth 2 (two) times daily 60 tablet 11   colchicine (COLCRYS) 0.6 mg tablet Take 1 daily (Patient not taking: Reported on 09/08/2022) 10 tablet 0   tamsulosin (FLOMAX) 0.4 mg capsule Take 0.4 mg by mouth once daily (Patient not taking: Reported on 09/08/2022)     No current facility-administered medications for this visit.    ALLERGIES: Morphine, Sulfa (sulfonamide antibiotics), and Codeine  PAST MEDICAL HISTORY: Past Medical History:  Diagnosis Date   Benign prostatic hypertrophy    Hiatal hernia with gastroesophageal reflux    Hypogonadism in male    Nephrolithiasis    Osteoarthritis    PUD (peptic ulcer disease)     PAST SURGICAL HISTORY: Past Surgical History:  Procedure Laterality Date   CORONARY ARTERY BYPASS GRAFT  01/25/1988   ROBOT ASSISTED LAPAROSCOPIC APPENDECTOMY  06/22/2022   Dr. Arrie Senate   ABDOMINAL AORTIC ANEURYSM REPAIR     Laparoscopic cholecystectomy 09/06/08       FAMILY HISTORY: Family History  Problem Relation Name Age of Onset   High blood pressure (Hypertension) Other     Heart disease Other       SOCIAL HISTORY: Social History   Socioeconomic History   Marital status: Married  Tobacco Use   Smoking status: Former    Current packs/day: 0.00    Average packs/day: 1 pack/day for 34.0 years (34.0 ttl pk-yrs)    Types: Cigarettes    Start date: 01/24/1954    Quit date: 01/25/1988    Years since quitting: 34.7   Smokeless tobacco: Never  Substance and Sexual Activity   Alcohol use: No    Alcohol/week: 0.0 standard drinks of alcohol   Drug use: No   Sexual activity: Defer   Social Determinants of Health   Financial Resource Strain: Low Risk  (09/22/2022)   Overall Financial Resource Strain (CARDIA)    Difficulty of Paying Living Expenses: Not hard at all   Food Insecurity: No Food Insecurity (09/22/2022)   Hunger Vital Sign    Worried About Running Out of Food in the Last Year: Never true    Ran Out of Food in the Last Year: Never true  Transportation Needs: No Transportation Needs (09/22/2022)   PRAPARE - Administrator, Civil Service (Medical): No    Lack of Transportation (Non-Medical): No    PHYSICAL EXAM: Vitals:   10/13/22 0739  BP: (!) 164/90  Pulse: 95   Body mass index is 18.62 kg/m. Weight: 67.6 kg (149 lb)   GENERAL: Alert, active, oriented x3  HEENT: Pupils equal reactive to light. Extraocular movements are intact. Sclera clear. Palpebral conjunctiva normal red color.Pharynx clear.  NECK: Supple with no palpable mass and no adenopathy.  LUNGS: Sound clear with no rales rhonchi or wheezes.  HEART: Regular rhythm S1  and S2 without murmur.  ABDOMEN: Soft and depressible, nontender with no palpable mass, no hepatomegaly.  Right groin reducible hernia, left groin lump.  EXTREMITIES: Well-developed well-nourished symmetrical with no dependent edema.  NEUROLOGICAL: Awake alert oriented, facial expression symmetrical, moving all extremities.  REVIEW OF DATA: I have reviewed the following data today: Appointment on 09/08/2022  Component Date Value   Glucose 09/08/2022 135 (H)    Sodium 09/08/2022 142    Potassium 09/08/2022 3.9    Chloride 09/08/2022 102    Carbon Dioxide (CO2) 09/08/2022 30.4    Urea Nitrogen (BUN) 09/08/2022 21    Creatinine 09/08/2022 1.2    Glomerular Filtration Ra* 09/08/2022 57 (L)    Calcium 09/08/2022 9.7    AST  09/08/2022 26    ALT  09/08/2022 19    Alk Phos (alkaline Phosp* 09/08/2022 92    Albumin 09/08/2022 4.0    Bilirubin, Total 09/08/2022 1.1    Protein, Total 09/08/2022 6.4    A/G Ratio 09/08/2022 1.7    WBC (White Blood Cell Co* 09/08/2022 6.7    RBC (Red Blood Cell Coun* 09/08/2022 3.71 (L)    Hemoglobin 09/08/2022 11.6 (L)    Hematocrit 09/08/2022 35.4 (L)     MCV (Mean Corpuscular Vo* 09/08/2022 95.4    MCH (Mean Corpuscular He* 09/08/2022 31.3 (H)    MCHC (Mean Corpuscular H* 09/08/2022 32.8    Platelet Count 09/08/2022 215    RDW-CV (Red Cell Distrib* 09/08/2022 15.2 (H)    MPV (Mean Platelet Volum* 09/08/2022 12.1    Neutrophils 09/08/2022 4.32    Lymphocytes 09/08/2022 1.77    Monocytes 09/08/2022 0.43    Eosinophils 09/08/2022 0.09    Basophils 09/08/2022 0.03    Neutrophil % 09/08/2022 64.7    Lymphocyte % 09/08/2022 26.6    Monocyte % 09/08/2022 6.5    Eosinophil % 09/08/2022 1.4    Basophil% 09/08/2022 0.5    Immature Granulocyte % 09/08/2022 0.3    Immature Granulocyte Cou* 09/08/2022 0.02    Hemoglobin A1C 09/08/2022 7.5 (H)    Average Blood Glucose (C* 09/08/2022 169    Cholesterol, Total 09/08/2022 131    Triglyceride 09/08/2022 119    HDL (High Density Lipopr* 62/13/0865 30.7    LDL Calculated 09/08/2022 77    VLDL Cholesterol 09/08/2022 24    Cholesterol/HDL Ratio 09/08/2022 4.3    Color 09/08/2022 Light Yellow    Clarity 09/08/2022 Clear    Specific Gravity 09/08/2022 1.012    pH, Urine 09/08/2022 6.5    Protein, Urinalysis 09/08/2022 Negative    Glucose, Urinalysis 09/08/2022 Negative    Ketones, Urinalysis 09/08/2022 Negative    Blood, Urinalysis 09/08/2022 Negative    Nitrite, Urinalysis 09/08/2022 Negative    Leukocyte Esterase, Urin* 09/08/2022 Negative    Bilirubin, Urinalysis 09/08/2022 Negative    Urobilinogen, Urinalysis 09/08/2022 0.2    WBC, UA 09/08/2022 <1    Red Blood Cells, Urinaly* 09/08/2022 <1    Bacteria, Urinalysis 09/08/2022 0-5    Squamous Epithelial Cell* 09/08/2022 0   Office Visit on 09/08/2022  Component Date Value   Vent Rate (bpm) 09/08/2022 68    QRS Interval (msec) 09/08/2022 118    QT Interval (msec) 09/08/2022 394    QTc (msec) 09/08/2022 418   Office Visit on 08/01/2022  Component Date Value   Uric Acid 08/01/2022 5.9      ASSESSMENT: [name] is a 89 y.o. male  presenting for consultation for right versus bilateral inguinal hernia.  The patient presents with a symptomatic, reducible inguinal hernia. Patient was oriented about the diagnosis of inguinal hernia and its implication. The patient was oriented about the treatment alternatives (observation vs surgical repair). Due to patient symptoms, repair is recommended. Patient oriented about the surgical procedure, the use of mesh and its risk of complications such as: infection, bleeding, injury to vas deference, vasculature and testicle, injury to bowel or bladder, and chronic pain.   Patient on blood thinner.  Will do cardiac clearance for holding Eliquis for at least 48 hours.  He understand high risk of bleeding.  She also understand the high risk of complication due to advanced age.  I think that the fact that patient is getting symptomatic from right middle hernia is better to do repair in an elective setting that it needing to do elective repair in an emergent setting especially on the patient on blood thinner.  Bilateral recurrent inguinal hernia without obstruction or gangrene [K40.21]  PLAN: 1.  Robotic assisted laparoscopic right vs bilateral inguinal hernia repair with mesh (16109) 2.  Hold Eliquis 2 days before surgery 3.  Avoid taking aspirin 5 days before procedure 4.  Cardiac Clearance 5.  Contact us if has any question or concern.   Patient verbalized understanding, all questions were answered, and were agreeable with the plan outlined above.    Carolan Shiver, MD  Electronically signed by Carolan Shiver, MD

## 2022-10-13 NOTE — H&P (View-Only) (Signed)
PATIENT PROFILE: [name] is a 89 y.o. male who presents to the Clinic for consultation at the request of [name] for evaluation of inguinal hernia.  PCP:  Walter Montgomery, MD  HISTORY OF PRESENT ILLNESS: [name] reports he has been feeling a lump in the right inguinal hernia.  He endorses that he felt it when he sneezed.  He was painful.  He endorses that he has been feeling pain in the right groin.  No pain radiation.  Pain aggravated by coughing or sneezing.  Alleviating factor is resting and reducing the hernia.  Patient history of bilateral inguinal hernia repair many years ago.  Denies any episode of abdominal distention, nausea or vomiting.   PROBLEM LIST: Problem List  Date Reviewed: 08/15/2022          Noted   PVD (peripheral vascular disease) (CMS-HCC) 08/14/2019   Stage 3b chronic kidney disease (CMS/HHS-HCC) 08/14/2019   Atherosclerosis of native arteries of extremity with rest pain (CMS/HHS-HCC) 05/17/2019   Overview    Last Assessment & Plan:  Formatting of this note might be different from the original. Patient's wrist pain is resolved after extensive revascularization last month.  Overall he is slowly improving.  His reperfusion swelling is improving but still significant.  We discussed elevation and compression as well as increasing his activity.  We will plan to see him back in 3 months with noninvasive studies for follow-up.      Left inguinal hernia 07/18/2017   Transient cerebral ischemia 05/23/2016   Atrial fibrillation, new onset (CMS/HHS-HCC) 05/23/2016   Type 2 diabetes with nephropathy (CMS/HHS-HCC) 06/04/2015   Abdominal aortic aneurysm without rupture (CMS-HCC) 06/04/2015   Overview    Repair 2005      Hypertension 11/28/2013   Ischemic heart disease 11/28/2013   Overview    with previous CABG procedure in 1990      Hyperlipidemia 11/28/2013   Spinal stenosis 11/28/2013    GENERAL REVIEW OF SYSTEMS:   General ROS: negative for - chills,  fatigue, fever, weight gain or weight loss Allergy and Immunology ROS: negative for - hives  Hematological and Lymphatic ROS: negative for - bleeding problems or bruising, negative for palpable nodes Endocrine ROS: negative for - heat or cold intolerance, hair changes Respiratory ROS: negative for - cough, shortness of breath or wheezing Cardiovascular ROS: no chest pain or palpitations GI ROS: negative for nausea, vomiting, abdominal pain, diarrhea, constipation Musculoskeletal ROS: negative for - joint swelling or muscle pain Neurological ROS: negative for - confusion, syncope Dermatological ROS: negative for pruritus and rash Psychiatric: negative for anxiety, depression, difficulty sleeping and memory loss  MEDICATIONS: Current Outpatient Medications  Medication Sig Dispense Refill   ACCU-CHEK GUIDE GLUCOSE METER Misc 1 each as directed 1 each 0   ACCU-CHEK GUIDE TEST STRIPS test strip 1 each (1 strip total) 3 (three) times daily Use as instructed. 100 each 5   acetaminophen (TYLENOL) 325 MG tablet Take by mouth     apixaban (ELIQUIS) 5 mg tablet TAKE 1 TABLET BY MOUTH EVERY 12 HOURS 180 tablet 3   atorvastatin (LIPITOR) 40 MG tablet Take 1 tablet (40 mg total) by mouth every evening 90 tablet 3   blood glucose diagnostic (ACCU-CHEK AVIVA PLUS TEST STRP) test strip 1 each (1 strip total) by XX route 3 (three) times daily Dx E11.9 100 each 5   blood glucose diagnostic test strip USE TO CHECK BLOOD SUGAR THREE TIMES DAILY AS INSTRUCTED 100 each 0   blood-glucose  meter (ACCU-CHEK AVIVA PLUS METER) Misc 1 each by XX route as directed. Dx E11.9 1 each 0   cholecalciferol (VITAMIN D3) 1,000 unit capsule Take 1,000 Units by mouth once daily.     colchicine (COLCRYS) 0.6 mg tablet Take 1 daily as need for gout. 10 tablet 0   cyanocobalamin (VITAMIN B12) 100 MCG tablet Take 100 mcg by mouth once daily.     dilTIAZem (CARDIZEM CD) 180 MG CD capsule Take 1 capsule (180 mg total) by mouth once  daily 30 capsule 11   lancets Use 1 each 3 (three) times daily Compatible with Accu Chek guide 100 each 5   lisinopriL-hydroCHLOROthiazide (ZESTORETIC) 20-12.5 mg tablet Take 1 tablet by mouth 2 (two) times daily 60 tablet 11   colchicine (COLCRYS) 0.6 mg tablet Take 1 daily (Patient not taking: Reported on 09/08/2022) 10 tablet 0   tamsulosin (FLOMAX) 0.4 mg capsule Take 0.4 mg by mouth once daily (Patient not taking: Reported on 09/08/2022)     No current facility-administered medications for this visit.    ALLERGIES: Morphine, Sulfa (sulfonamide antibiotics), and Codeine  PAST MEDICAL HISTORY: Past Medical History:  Diagnosis Date   Benign prostatic hypertrophy    Hiatal hernia with gastroesophageal reflux    Hypogonadism in male    Nephrolithiasis    Osteoarthritis    PUD (peptic ulcer disease)     PAST SURGICAL HISTORY: Past Surgical History:  Procedure Laterality Date   CORONARY ARTERY BYPASS GRAFT  01/25/1988   ROBOT ASSISTED LAPAROSCOPIC APPENDECTOMY  06/22/2022   [name]   ABDOMINAL AORTIC ANEURYSM REPAIR     Laparoscopic cholecystectomy 09/06/08       FAMILY HISTORY: Family History  Problem Relation Name Age of Onset   High blood pressure (Hypertension) Other     Heart disease Other       SOCIAL HISTORY: Social History   Socioeconomic History   Marital status: Married  Tobacco Use   Smoking status: Former    Current packs/day: 0.00    Average packs/day: 1 pack/day for 34.0 years (34.0 ttl pk-yrs)    Types: Cigarettes    Start date: 01/24/1954    Quit date: 01/25/1988    Years since quitting: 34.7   Smokeless tobacco: Never  Substance and Sexual Activity   Alcohol use: No    Alcohol/week: 0.0 standard drinks of alcohol   Drug use: No   Sexual activity: Defer   Social Determinants of Health   Financial Resource Strain: Low Risk  (09/22/2022)   Overall Financial Resource Strain (CARDIA)    Difficulty of Paying Living Expenses: Not hard at all   Food Insecurity: No Food Insecurity (09/22/2022)   Hunger Vital Sign    Worried About Running Out of Food in the Last Year: Never true    Ran Out of Food in the Last Year: Never true  Transportation Needs: No Transportation Needs (09/22/2022)   PRAPARE - Administrator, Civil Service (Medical): No    Lack of Transportation (Non-Medical): No    PHYSICAL EXAM: Vitals:   10/13/22 0739  BP: (!) 164/90  Pulse: 95   Body mass index is 18.62 kg/m. Weight: 67.6 kg (149 lb)   GENERAL: Alert, active, oriented x3  HEENT: Pupils equal reactive to light. Extraocular movements are intact. Sclera clear. Palpebral conjunctiva normal red color.Pharynx clear.  NECK: Supple with no palpable mass and no adenopathy.  LUNGS: Sound clear with no rales rhonchi or wheezes.  HEART: Regular rhythm S1  and S2 without murmur.  ABDOMEN: Soft and depressible, nontender with no palpable mass, no hepatomegaly.  Right groin reducible hernia, left groin lump.  EXTREMITIES: Well-developed well-nourished symmetrical with no dependent edema.  NEUROLOGICAL: Awake alert oriented, facial expression symmetrical, moving all extremities.  REVIEW OF DATA: I have reviewed the following data today: Appointment on 09/08/2022  Component Date Value   Glucose 09/08/2022 135 (H)    Sodium 09/08/2022 142    Potassium 09/08/2022 3.9    Chloride 09/08/2022 102    Carbon Dioxide (CO2) 09/08/2022 30.4    Urea Nitrogen (BUN) 09/08/2022 21    Creatinine 09/08/2022 1.2    Glomerular Filtration Ra* 09/08/2022 57 (L)    Calcium 09/08/2022 9.7    AST  09/08/2022 26    ALT  09/08/2022 19    Alk Phos (alkaline Phosp* 09/08/2022 92    Albumin 09/08/2022 4.0    Bilirubin, Total 09/08/2022 1.1    Protein, Total 09/08/2022 6.4    A/G Ratio 09/08/2022 1.7    WBC (White Blood Cell Co* 09/08/2022 6.7    RBC (Red Blood Cell Coun* 09/08/2022 3.71 (L)    Hemoglobin 09/08/2022 11.6 (L)    Hematocrit 09/08/2022 35.4 (L)     MCV (Mean Corpuscular Vo* 09/08/2022 95.4    MCH (Mean Corpuscular He* 09/08/2022 31.3 (H)    MCHC (Mean Corpuscular H* 09/08/2022 32.8    Platelet Count 09/08/2022 215    RDW-CV (Red Cell Distrib* 09/08/2022 15.2 (H)    MPV (Mean Platelet Volum* 09/08/2022 12.1    Neutrophils 09/08/2022 4.32    Lymphocytes 09/08/2022 1.77    Monocytes 09/08/2022 0.43    Eosinophils 09/08/2022 0.09    Basophils 09/08/2022 0.03    Neutrophil % 09/08/2022 64.7    Lymphocyte % 09/08/2022 26.6    Monocyte % 09/08/2022 6.5    Eosinophil % 09/08/2022 1.4    Basophil% 09/08/2022 0.5    Immature Granulocyte % 09/08/2022 0.3    Immature Granulocyte Cou* 09/08/2022 0.02    Hemoglobin A1C 09/08/2022 7.5 (H)    Average Blood Glucose (C* 09/08/2022 169    Cholesterol, Total 09/08/2022 131    Triglyceride 09/08/2022 119    HDL (High Density Lipopr* 62/13/0865 30.7    LDL Calculated 09/08/2022 77    VLDL Cholesterol 09/08/2022 24    Cholesterol/HDL Ratio 09/08/2022 4.3    Color 09/08/2022 Light Yellow    Clarity 09/08/2022 Clear    Specific Gravity 09/08/2022 1.012    pH, Urine 09/08/2022 6.5    Protein, Urinalysis 09/08/2022 Negative    Glucose, Urinalysis 09/08/2022 Negative    Ketones, Urinalysis 09/08/2022 Negative    Blood, Urinalysis 09/08/2022 Negative    Nitrite, Urinalysis 09/08/2022 Negative    Leukocyte Esterase, Urin* 09/08/2022 Negative    Bilirubin, Urinalysis 09/08/2022 Negative    Urobilinogen, Urinalysis 09/08/2022 0.2    WBC, UA 09/08/2022 <1    Red Blood Cells, Urinaly* 09/08/2022 <1    Bacteria, Urinalysis 09/08/2022 0-5    Squamous Epithelial Cell* 09/08/2022 0   Office Visit on 09/08/2022  Component Date Value   Vent Rate (bpm) 09/08/2022 68    QRS Interval (msec) 09/08/2022 118    QT Interval (msec) 09/08/2022 394    QTc (msec) 09/08/2022 418   Office Visit on 08/01/2022  Component Date Value   Uric Acid 08/01/2022 5.9      ASSESSMENT: Mr. Bennink is a 89 y.o. male  presenting for consultation for right versus bilateral inguinal hernia.  The patient presents with a symptomatic, reducible inguinal hernia. Patient was oriented about the diagnosis of inguinal hernia and its implication. The patient was oriented about the treatment alternatives (observation vs surgical repair). Due to patient symptoms, repair is recommended. Patient oriented about the surgical procedure, the use of mesh and its risk of complications such as: infection, bleeding, injury to vas deference, vasculature and testicle, injury to bowel or bladder, and chronic pain.   Patient on blood thinner.  Will do cardiac clearance for holding Eliquis for at least 48 hours.  He understand high risk of bleeding.  She also understand the high risk of complication due to advanced age.  I think that the fact that patient is getting symptomatic from right middle hernia is better to do repair in an elective setting that it needing to do elective repair in an emergent setting especially on the patient on blood thinner.  Bilateral recurrent inguinal hernia without obstruction or gangrene [K40.21]  PLAN: 1.  Robotic assisted laparoscopic right vs bilateral inguinal hernia repair with mesh (16109) 2.  Hold Eliquis 2 days before surgery 3.  Avoid taking aspirin 5 days before procedure 4.  Cardiac Clearance 5.  Contact us if has any question or concern.   Patient verbalized understanding, all questions were answered, and were agreeable with the plan outlined above.    Carolan Shiver, MD  Electronically signed by Carolan Shiver, MD

## 2022-10-20 ENCOUNTER — Other Ambulatory Visit: Payer: Self-pay

## 2022-10-20 ENCOUNTER — Encounter
Admission: RE | Admit: 2022-10-20 | Discharge: 2022-10-20 | Disposition: A | Discharge status: Home / Self Care | Payer: Medicare PPO | Source: Ambulatory Visit | Attending: General Surgery | Admitting: General Surgery

## 2022-10-20 DIAGNOSIS — E119 Type 2 diabetes mellitus without complications: Secondary | ICD-10-CM

## 2022-10-20 HISTORY — DX: Unilateral inguinal hernia, without obstruction or gangrene, not specified as recurrent: K40.90

## 2022-10-20 HISTORY — DX: Peripheral vascular disease, unspecified: I73.9

## 2022-10-20 HISTORY — DX: Chronic kidney disease, stage 3b: N18.32

## 2022-10-20 HISTORY — DX: Chronic ischemic heart disease, unspecified: I25.9

## 2022-10-20 HISTORY — DX: Spinal stenosis, site unspecified: M48.00

## 2022-10-20 HISTORY — DX: Unspecified appendicitis: K37

## 2022-10-20 HISTORY — DX: Unspecified atrial fibrillation: I48.91

## 2022-10-20 NOTE — Patient Instructions (Addendum)
Your procedure is scheduled on: 10/28/22 - Friday Report to the Registration Desk on the 1st floor of the [facility]. To find out your arrival time, please call [phone] between 1PM - 3PM on: 10/27/22 - Thursday If your arrival time is 6:00 am, do not arrive before that time as the [facility] entrance doors do not open until 6:00 am.  REMEMBER: Instructions that are not followed completely may result in serious medical risk, up to and including death; or upon the discretion of your surgeon and anesthesiologist your surgery may need to be rescheduled.  Do not eat food or drink any liquids after midnight the night before surgery.  No gum chewing or hard candies.   One week prior to surgery:   - Stop Anti-inflammatories (NSAIDS) such as Advil, Aleve, Ibuprofen, Motrin, Naproxen, Naprosyn and Aspirin based products such as Excedrin, Goody's Powder, BC Powder. You may however, continue to take Tylenol if needed for pain up until the day of surgery.  - Stop ANY OVER THE COUNTER supplements until after surgery.- Multiple Vitamins-Minerals   Stop taking ELIQUIS beginning 10/26/22.  Hold lisinopril-hydrochlorothiazide on the morning of surgery.   TAKE ONLY THESE MEDICATIONS THE MORNING OF SURGERY WITH A SIP OF WATER:  diltiazem (CARDIZEM CD)   No Alcohol for 24 hours before or after surgery.  No Smoking including e-cigarettes for 24 hours before surgery.  No chewable tobacco products for at least 6 hours before surgery.  No nicotine patches on the day of surgery.  Do not use any "recreational" drugs for at least a week (preferably 2 weeks) before your surgery.  Please be advised that the combination of cocaine and anesthesia may have negative outcomes, up to and including death. If you test positive for cocaine, your surgery will be cancelled.  On the morning of surgery brush your teeth with toothpaste and water, you may rinse your mouth with mouthwash if you wish. Do not  swallow any toothpaste or mouthwash.  Use CHG Soap or wipes as directed on instruction sheet.  Do not wear jewelry, make-up, hairpins, clips or nail polish.  For welded (permanent) jewelry: bracelets, anklets, waist bands, etc.  Please have this removed prior to surgery.  If it is not removed, there is a chance that hospital personnel will need to cut it off on the day of surgery.  Do not wear lotions, powders, or perfumes.   Do not shave body hair from the neck down 48 hours before surgery.  Contact lenses, hearing aids and dentures may not be worn into surgery.  Do not bring valuables to the hospital. [facility] is not responsible for any missing/lost belongings or valuables.   Notify your doctor if there is any change in your medical condition (cold, fever, infection).  Wear comfortable clothing (specific to your surgery type) to the hospital.  After surgery, you can help prevent lung complications by doing breathing exercises.  Take deep breaths and cough every 1-2 hours. Your doctor may order a device called an Incentive Spirometer to help you take deep breaths. When coughing or sneezing, hold a pillow firmly against your incision with both hands. This is called "splinting." Doing this helps protect your incision. It also decreases belly discomfort.  If you are being admitted to the hospital overnight, leave your suitcase in the car. After surgery it may be brought to your room.  In case of increased patient census, it may be necessary for you, the patient, to continue your postoperative care in  the Same Day Surgery department.  If you are being discharged the day of surgery, you will not be allowed to drive home. You will need a responsible individual to drive you home and stay with you for 24 hours after surgery.   If you are taking public transportation, you will need to have a responsible individual with you.  Please call the Pre-admissions Testing Dept. at [phone]  if you have any questions about these instructions.  Surgery Visitation Policy:  Patients having surgery or a procedure may have two visitors.  Children under the age of 82 must have an adult with them who is not the patient.  Inpatient Visitation:    Visiting hours are 7 a.m. to 8 p.m. Up to four visitors are allowed at one time in a patient room. The visitors may rotate out with other people during the day.  One visitor age 31 or older may stay with the patient overnight and must be in the room by 8 p.m.

## 2022-10-24 ENCOUNTER — Encounter: Payer: Self-pay | Admitting: General Surgery

## 2022-10-24 ENCOUNTER — Ambulatory Visit
Admission: RE | Admit: 2022-10-24 | Discharge: 2022-10-24 | Disposition: A | Discharge status: Home / Self Care | Payer: Medicare PPO | Source: Ambulatory Visit | Attending: Urology | Admitting: Urology

## 2022-10-24 ENCOUNTER — Ambulatory Visit
Admission: RE | Admit: 2022-10-24 | Discharge: 2022-10-24 | Disposition: A | Discharge status: Home / Self Care | Payer: Medicare PPO | Attending: Urology | Admitting: Urology

## 2022-10-24 DIAGNOSIS — N200 Calculus of kidney: Secondary | ICD-10-CM

## 2022-10-25 ENCOUNTER — Encounter: Payer: Self-pay | Admitting: General Surgery

## 2022-10-25 NOTE — Progress Notes (Addendum)
Perioperative / Anesthesia Services  Pre-Admission Testing Clinical Review / Pre-Operative Anesthesia Consult  Date: 10/27/22  Patient Demographics:  Name: [name] DOB:   17-Sep-1930 MRN:   [id number]  Planned Surgical Procedure(s):    Case: [id number] Date/Time: 10/28/22 0715   Procedure: XI ROBOTIC ASSISTED BILATERAL INGUINAL HERNIA (Bilateral: Groin)   Anesthesia type: General   Pre-op diagnosis: K40.21 bil recurrent inguinal hernia w/o obstruction or gangrene   Location: [facility] OR ROOM 04 / [facility] FOR [facility]   Surgeons: Carolan Shiver, MD     NOTE: Available PAT nursing documentation and vital signs have been reviewed. Clinical nursing staff has updated patient's PMH/PSHx, current medication list, and drug allergies/intolerances to ensure comprehensive history available to assist in medical decision making as it pertains to the aforementioned surgical procedure and anticipated anesthetic course. Extensive review of available clinical information personally performed. [name] PMH and PSHx updated with any diagnoses/procedures that  may have been inadvertently omitted during his intake with the pre-admission testing department's nursing staff.  Clinical Discussion:  [name] is a 89 y.o. male who is submitted for pre-surgical anesthesia review and clearance prior to him undergoing the above procedure. Patient is a Former Smoker (quit 01/1988). Pertinent PMH includes: CAD (s/p CABG), anterior wall MI, ischemic heart disease, HFrEF, atrial fibrillation, AAA (s/p repair), PVD, TIA, HTN, HLD, T2DM, CKD-III, GERD (no daily Tx), hiatal hernia, PUD, BPH, nephrolithiasis, OA, recurrent BILATERAL inguinal hernia.  Patient is followed by cardiology Juliann Pares, MD). He was last seen in the cardiology clinic on 09/08/2022; notes reviewed. At the time of his clinic visit, patient was reported to be doing reasonably well from a cardiovascular perspective. Patient denied any  chest pain, shortness of breath, PND, orthopnea, palpitations, significant peripheral edema, weakness, fatigue, vertiginous symptoms, or presyncope/syncope. Patient with a past medical history significant for cardiovascular diagnoses. Documented physical exam was grossly benign, providing no evidence of acute exacerbation and/or decompensation of the patient's known cardiovascular conditions.  Patient suffered an anterior wall MI on 07/01/1988.    He underwent diagnostic LEFT heart catheterization revealing multivessel CAD; 95% proximal RCA-1, 100% proximal RCA-2, 95% proximal RCA-3, 95% mid RCA, 95% distal RCA, 95% acute marginal, 95% proximal LCx, 25% ramus intermedius, 100% mid LAD, and 50% D1.  Patient required placement of an IABP and temporary transvenous pacemaker.  PTCA of the 100% mid LAD lesion attempted reducing stenosis to > 25%.  Once clinically more stable, patient underwent repeat diagnostic LEFT heart catheterization on 07/07/1988, again revealing multivessel CAD; 95% proximal RCA, 95% mid RCA, 75% distal RCA-1, 95% distal RCA-2, 25% LM, 50% proximal LCx, 50% ramus intermedius, percent mid LAD, 50% D1-1, and 25% D1-2.  Infarct-related artery (RA) revascularization performed to the mid LAD.  Given the degree and complexity of patient's coronary artery disease, patient was referred to CVTS for consideration of revascularization procedure.  Patient underwent three-vessel CABG on 07/15/1988.  LIMA-LAD, SVG-RCA, and SVG-ramus intermedius bypass grafts were placed.  Long-term cardiac event monitor study performed on 03/26/2015 revealing a predominant underlying sinus rhythm at an average rate of 56 bpm; range 35-106 bpm.  60% of the patient's beats were in bradycardia.  Longest R to R interval was borderline at 2.36 seconds.  Frequent atrial and ventricular ectopy noted.  There were 1478 isolated PVCs, in addition to 11 atrial runs totaling 41 beats.  Longest atrial run (PAT) was 9 beats at a  rate of 106 bpm.  There were 739 isolated PVCs, 79 ventricular  couplets, and 18 bigeminal cycles.  Wall artifact interfered with identification of ventricular runs, the longest ventricular rhythm was probably 3-4 beats at a rate of 60 to 70 bpm (possible accelerated idioventricular rhythm).   Most recent TTE was performed on 12/30/2020 revealing a mildly reduced left ventricular systolic function with an EF of 45%.  There was anterior and septal hypokinesis.  Left atrium was mildly enlarged.  Right ventricular size and function normal.  There was trivial to mild paravalvular regurgitation.  RVSP 31.5 mmHg. All transvalvular gradients were noted to be normal providing no evidence suggestive of valvular stenosis. Aorta normal in size with no evidence of aneurysmal dilatation.  Patient has suffered a TIA in the past; date unknown/unclear at time of consult. Patient has no residual complications or neurological manifestations following event.   [name] is scheduled for an elective XI ROBOTIC ASSISTED BILATERAL INGUINAL HERNIA on 10/28/2022 with Dr. Carolan Shiver, MD.  Given patient's past medical history significant for cardiovascular diagnoses, presurgical cardiac clearance was sought by the PAT team. Per cardiology, "this patient is optimized for surgery and may proceed with the planned procedural course with a LOW risk of significant perioperative cardiovascular complications".  Again, this patient is on daily oral anticoagulation therapy using a DOAC medication. He has been instructed on recommendations for holding his apixaban for 2 days prior to his procedure with plans to restart as soon as postoperative bleeding risk felt to be minimized by his attending surgeon. The patient has been instructed that his last dose of his apixaban should be on 10/25/2022.  Patient denies previous perioperative complications with anesthesia in the past. In review of the available records, it is noted that  patient underwent a general anesthetic course here at [facility] in 05/2022 without documented complications.      08/29/2022    9:59 AM 08/02/2022   10:40 AM 07/21/2022    8:30 AM  Vitals with BMI  Height 6\' 0"  6\' 0"    Weight 146 lbs 13 oz 155 lbs   BMI 19.91 21.02   Systolic 159  143  Diastolic 86  69  Pulse 81      Providers/Specialists:   NOTE: Primary physician provider listed below. Patient may have been seen by APP or partner within same practice.   PROVIDER ROLE / SPECIALTY LAST Beverely Pace, MD General Surgery (Surgeon) 10/13/2022  Gracelyn Nurse, MD Primary Care Provider 10/11/2022  Rudean Hitt, MD Cardiology 09/08/2022   Allergies:  Morphine and codeine and Sulfa antibiotics  Current Home Medications:   No current facility-administered medications for this encounter.    acetaminophen (TYLENOL) 325 MG tablet   atorvastatin (LIPITOR) 40 MG tablet   diltiazem (CARDIZEM CD) 180 MG 24 hr capsule   ELIQUIS 5 MG TABS tablet   glucose blood (BAYER CONTOUR TEST) test strip   lisinopril-hydrochlorothiazide (PRINZIDE,ZESTORETIC) 20-12.5 MG tablet   Multiple Vitamins-Minerals (CENTRUM SILVER 50+MEN PO)   Polyethyl Glycol-Propyl Glycol 0.4-0.3 % SOLN   History:   Past Medical History:  Diagnosis Date   AAA (abdominal aortic aneurysm) (HCC)    a.) s/p repair 12/26/2000 at [facility]   Actinic keratosis    Acute anterior wall MI (HCC) 07/01/1988   a.) LHC 07/01/1988: 95/100/95% pRCA, 95% mRCA, 95% dRCA, 95% acute marginal, 95% pLCx, 25% RA, 100% mLAD, 50% D1 --> PTCA mLAD reduced stenosis to < 25%; b.) repeat LHC 07/07/1988: 95% pRCA, 95% mRCA, 75% dRCA, 25% LM, 50%  pLCx, 50% RI, 100% mLAD, 50% D1-1, 25% D1-2 --> successful IRA revascularization (mLAD) --> consult CVTS; c.) s/p 3v CABG 07/15/1988   Appendicitis    Arthritis    Atrial fibrillation (HCC)    a.) CHA2DS2-VASc = 8 (age x2, sex, HFpEF, HTN, TIA x 2, prior  MI, T2DM); b.) cardiac rate/rhythm maintained on oral diltiazem; chronically anticoagulated using apixaban   Basal cell carcinoma 09/01/2021   Right medial canthus/lower eyelid. Nodular BCC. Excixed 09/01/2021   Bilateral recurrent inguinal hernia    BPH (benign prostatic hyperplasia)    CKD (chronic kidney disease), stage III (HCC)    Coronary artery disease 06/1988   a.) anterior MI 07/01/1988 --> LHC: 95/100/95% pRCA, 95% mRCA, 95% dRCA, 95% acute marginal, 95% pLCx, 25% RA, 100% mLAD, 50% D1  (PTCA mLAD); b.) b.) repeat LHC 07/07/1988: 95% pRCA, 95% mRCA, 75% dRCA, 25% LM, 50% pLCx, 50% RI, 100% mLAD, 50% D1-1, 25% D1-2--> successful IRA revascularization (mLAD) --> consult CVTS; c.) s/p 3v CABG 07/15/1988   GERD (gastroesophageal reflux disease)    HFrEF (heart failure with reduced ejection fraction) (HCC)    a.) TTE 03/25/2014: EF 40%, glob HK, G1DD, mild LAE, mild MR/TR, triv PR; b.) TTE 03/26/2015: EF 50-55%, apical anterior and apical HK, mod LAE, mild MR; c.) TTE 05/27/2019: EF 40-45%, no RWMAs, PASP 45.8, mild-mod MR/TR; d.) TTE 12/30/2020: EF 45%, ant/septal HK, mild LAE, triv AR, mild MR/TR/PR   Hiatal hernia    History of kidney stones    HLD (hyperlipidemia)    HTN (hypertension)    Hx of basal cell carcinoma 06/30/2016   L proximal mandible   Hx of basal cell carcinoma 07/19/2016   L medial lower eyelid   Hx of basal cell carcinoma 09/20/2016   R upper back lateral   Hx of basal cell carcinoma 12/01/2016   L post shoulder sup medial scapula   Hx of basal cell carcinoma 12/01/2016   L forehead, 2.0cm above med brow   Hx of basal cell carcinoma 12/17/2018   L forehead aboove medial brow   Hx of melanoma in situ 08/16/2016   L proximal mandible/infra auricular   Hypogonadism in male    Ischemic heart disease    Kidney stone    Left inguinal hernia    On apixaban therapy    Peripheral vascular disease (HCC)    PUD (peptic ulcer disease)    S/P CABG x 3 07/15/1988   a.)  LIMA-LAD, SVG-RCA, SVG-RI   Spinal stenosis    Squamous cell carcinoma of skin 11/01/2017   R superior sideburn in hairline   Squamous cell carcinoma of skin 01/03/2018   L medial dorsum wrist lateral and medial   Squamous cell carcinoma of skin 01/03/2018   L proximal thumb   Squamous cell carcinoma of skin 04/12/2018   L dorsum proximal index finger   Squamous cell carcinoma of skin 04/12/2018   L dorsum medial proximal thumb   Squamous cell carcinoma of skin 07/04/2018   R superior sideburn   Squamous cell carcinoma of skin 07/04/2018   L dorsum proximal index finger   Squamous cell carcinoma of skin 11/13/2018   R helix   Squamous cell carcinoma of skin 03/05/2020   scalp   Stage 3b chronic kidney disease (HCC)    T2DM (type 2 diabetes mellitus) (HCC)    TIA (transient ischemic attack)    Transfusion history 1990   received several units of blood after CABG   Past  Surgical History:  Procedure Laterality Date   ABDOMINAL AORTIC ANEURYSM REPAIR  12/26/2000   Procedure: ABDOMINAL AORTIC ANEURYSM REPAIR; Location: [facility]   APPENDECTOMY     BRAIN SURGERY     CATARACT EXTRACTION W/ INTRAOCULAR LENS IMPLANT Bilateral    CHOLECYSTECTOMY N/A 11/10/2008   Dr Lemar Livings   CORONARY ARTERY BYPASS GRAFT  07/15/1988   Procedure: CORONARY ARTERY BYPASS GRAFT; Location: [facility]; Surgeon: H. Jorge Mandril, MD   ENDARTERECTOMY FEMORAL Right 05/29/2019   Procedure: ENDARTERECTOMY FEMORAL;  Surgeon: Annice Needy, MD;  Location: [facility] ORS;  Service: Vascular;  Laterality: Right;   INGUINAL HERNIA REPAIR Left 08/07/2017   Medium Ultra Pro mesh Surgeon: Earline Mayotte, MD;  Location: [facility] ORS;  Service: General;  Laterality: Left;   INSERTION OF ILIAC STENT Right 05/29/2019   Procedure: INSERTION OF ILIAC STENT ( AND SFA STENT);  Surgeon: Annice Needy, MD;  Location: [facility] ORS;  Service: Vascular;  Laterality: Right;   LEFT HEART CATH AND CORONARY ANGIOGRAPHY Left 07/07/1988   Procedure: LEFT  HEART CATHETERIZATION AND CORONARY ANGIOPLASTY WITH INFARCT RELATED ARTERY (IRA) REVASCULARIZATION; Location: [facility]; Surgeon: Ned Clines, MD   LOWER EXTREMITY ANGIOGRAPHY Right 05/27/2019   Procedure: LOWER EXTREMITY ANGIOGRAPHY;  Surgeon: Annice Needy, MD;  Location: [facility] INVASIVE CV LAB;  Service: Cardiovascular;  Laterality: Right;   PTCA Left 07/01/1988   Procedure: LEFT HEART CATH AND CORONARY ANGIOGRAPHY WITH PTCA; Location: [facility]; Surgeon: Lieutenant Diego, MD   XI ROBOTIC LAPAROSCOPIC ASSISTED APPENDECTOMY N/A 06/22/2022   Procedure: XI ROBOTIC LAPAROSCOPIC ASSISTED APPENDECTOMY;  Surgeon: Carolan Shiver, MD;  Location: [facility] ORS;  Service: General;  Laterality: N/A;   Family History  Problem Relation Age of Onset   Cerebral aneurysm Mother        died at 58   CAD Father        died at 44   Social History   Tobacco Use   Smoking status: Former    Current packs/day: 0.00    Types: Cigarettes    Start date: 01/24/1958    Quit date: 01/25/1988    Years since quitting: 34.7    Passive exposure: Past   Smokeless tobacco: Never  Vaping Use   Vaping status: Never Used  Substance Use Topics   Alcohol use: No   Drug use: No    Pertinent Clinical Results:  LABS:   Lab Results  Component Value Date   WBC 8.2 07/21/2022   HGB 11.6 (L) 07/21/2022   HCT 35.9 (L) 07/21/2022   MCV 95.7 07/21/2022   PLT 142 (L) 07/21/2022   Lab Results  Component Value Date   NA 137 07/21/2022   K 3.9 07/21/2022   CO2 25 07/21/2022   GLUCOSE 169 (H) 07/21/2022   BUN 27 (H) 07/21/2022   CREATININE 1.46 (H) 07/21/2022   CALCIUM 9.1 07/21/2022   GFRNONAA 45 (L) 07/21/2022    ECG: Date: 09/08/2022 Time ECG obtained: 0814 AM Rate: 68 bpm Rhythm: atrial fibrillation Axis (leads I and aVF): Left axis deviation Intervals: QRS 118 ms. QTc 418 ms. ST segment and T wave changes: No evidence of acute ST segment elevation or depression.  Evidence of a possible age undetermined septal and  lateral infarct present. Comparison: Similar to previous tracing obtained on 06/10/2021 NOTE: Tracing obtained at [facility]; unable for review. Above based on cardiologist's interpretation.    IMAGING / PROCEDURES: TRANSTHORACIC ECHOCARDIOGRAM performed on 12/30/2020 Mildly reduced left ventricular systolic function with EF of 45% Anterior and septal  hypokinesis Mildly enlarged left atrium Right ventricular size and function normal Trivial AR Mild MR, TR, and PR RVSP 31.5 mmHg Normal gradients; no valvular stenosis No pericardial effusion  Impression and Plan:  [name] has been referred for pre-anesthesia review and clearance prior to him undergoing the planned anesthetic and procedural courses. Available labs, pertinent testing, and imaging results were personally reviewed by me in preparation for upcoming operative/procedural course. [facility] medical record has been updated following extensive record review and patient interview with PAT staff.   This patient has been appropriately cleared by cardiology to proceed with the planned surgical intervention at an overall LOW risk of potential significant perioperative cardiovascular complications. Based on clinical review performed today (10/27/22), barring any significant acute changes in the patient's overall condition, it is anticipated that he will be able to proceed with the planned surgical intervention. Any acute changes in clinical condition may necessitate his procedure being postponed and/or cancelled. Patient will meet with anesthesia team (MD and/or CRNA) on the day of his procedure for preoperative evaluation/assessment. Questions regarding anesthetic course will be fielded at that time.   Pre-surgical instructions were reviewed with the patient during his PAT appointment, and questions were fielded to satisfaction by PAT clinical staff. He has been instructed on which medications that he will need to hold prior to  surgery, as well as the ones that have been deemed safe/appropriate to take on the day of his procedure. As part of the general education provided by PAT, patient made aware both verbally and in writing, that he would need to abstain from the use of any illegal substances during his perioperative course.  He was advised that failure to follow the provided instructions could necessitate case cancellation or result in serious perioperative complications up to and including death. Patient encouraged to contact PAT and/or his surgeon's office to discuss any questions or concerns that may arise prior to surgery; verbalized understanding.   Quentin Mulling, MSN, APRN, FNP-C, CEN [facility]  Perioperative Services Nurse Practitioner Phone: [phone] Fax: [phone] 10/27/22 6:06 PM  NOTE: This note has been prepared using Dragon dictation software. Despite my best ability to proofread, there is always the potential that unintentional transcriptional errors may still occur from this process.

## 2022-10-27 ENCOUNTER — Encounter: Payer: Self-pay | Admitting: General Surgery

## 2022-10-28 ENCOUNTER — Encounter
Admission: RE | Disposition: A | Discharge status: Home / Self Care | Payer: Self-pay | Source: Ambulatory Visit | Attending: General Surgery

## 2022-10-28 ENCOUNTER — Ambulatory Visit: Payer: Medicare PPO | Admitting: Urgent Care

## 2022-10-28 ENCOUNTER — Other Ambulatory Visit: Payer: Self-pay

## 2022-10-28 ENCOUNTER — Ambulatory Visit
Admission: RE | Admit: 2022-10-28 | Discharge: 2022-10-28 | Disposition: A | Discharge status: Home / Self Care | Payer: Medicare PPO | Source: Ambulatory Visit | Attending: General Surgery | Admitting: General Surgery

## 2022-10-28 ENCOUNTER — Encounter: Payer: Self-pay | Admitting: General Surgery

## 2022-10-28 DIAGNOSIS — K279 Peptic ulcer, site unspecified, unspecified as acute or chronic, without hemorrhage or perforation: Secondary | ICD-10-CM | POA: Insufficient documentation

## 2022-10-28 DIAGNOSIS — Z951 Presence of aortocoronary bypass graft: Secondary | ICD-10-CM | POA: Diagnosis not present

## 2022-10-28 DIAGNOSIS — N400 Enlarged prostate without lower urinary tract symptoms: Secondary | ICD-10-CM | POA: Diagnosis not present

## 2022-10-28 DIAGNOSIS — K449 Diaphragmatic hernia without obstruction or gangrene: Secondary | ICD-10-CM | POA: Insufficient documentation

## 2022-10-28 DIAGNOSIS — E785 Hyperlipidemia, unspecified: Secondary | ICD-10-CM | POA: Diagnosis not present

## 2022-10-28 DIAGNOSIS — I2510 Atherosclerotic heart disease of native coronary artery without angina pectoris: Secondary | ICD-10-CM | POA: Diagnosis not present

## 2022-10-28 DIAGNOSIS — I5022 Chronic systolic (congestive) heart failure: Secondary | ICD-10-CM | POA: Insufficient documentation

## 2022-10-28 DIAGNOSIS — K4020 Bilateral inguinal hernia, without obstruction or gangrene, not specified as recurrent: Secondary | ICD-10-CM | POA: Diagnosis present

## 2022-10-28 DIAGNOSIS — D176 Benign lipomatous neoplasm of spermatic cord: Secondary | ICD-10-CM | POA: Insufficient documentation

## 2022-10-28 DIAGNOSIS — M1990 Unspecified osteoarthritis, unspecified site: Secondary | ICD-10-CM | POA: Diagnosis not present

## 2022-10-28 DIAGNOSIS — E119 Type 2 diabetes mellitus without complications: Secondary | ICD-10-CM

## 2022-10-28 DIAGNOSIS — K219 Gastro-esophageal reflux disease without esophagitis: Secondary | ICD-10-CM | POA: Insufficient documentation

## 2022-10-28 DIAGNOSIS — I130 Hypertensive heart and chronic kidney disease with heart failure and stage 1 through stage 4 chronic kidney disease, or unspecified chronic kidney disease: Secondary | ICD-10-CM | POA: Diagnosis not present

## 2022-10-28 DIAGNOSIS — E1151 Type 2 diabetes mellitus with diabetic peripheral angiopathy without gangrene: Secondary | ICD-10-CM | POA: Diagnosis not present

## 2022-10-28 DIAGNOSIS — E1122 Type 2 diabetes mellitus with diabetic chronic kidney disease: Secondary | ICD-10-CM | POA: Diagnosis not present

## 2022-10-28 DIAGNOSIS — N1832 Chronic kidney disease, stage 3b: Secondary | ICD-10-CM | POA: Diagnosis not present

## 2022-10-28 DIAGNOSIS — I4891 Unspecified atrial fibrillation: Secondary | ICD-10-CM | POA: Insufficient documentation

## 2022-10-28 DIAGNOSIS — Z87891 Personal history of nicotine dependence: Secondary | ICD-10-CM | POA: Diagnosis not present

## 2022-10-28 DIAGNOSIS — I252 Old myocardial infarction: Secondary | ICD-10-CM | POA: Diagnosis not present

## 2022-10-28 HISTORY — DX: Unspecified systolic (congestive) heart failure: I50.20

## 2022-10-28 HISTORY — DX: Bilateral inguinal hernia, without obstruction or gangrene, recurrent: K40.21

## 2022-10-28 HISTORY — DX: Type 2 diabetes mellitus without complications: E11.9

## 2022-10-28 HISTORY — DX: Long term (current) use of anticoagulants: Z79.01

## 2022-10-28 HISTORY — DX: Essential (primary) hypertension: I10

## 2022-10-28 HISTORY — DX: Peptic ulcer, site unspecified, unspecified as acute or chronic, without hemorrhage or perforation: K27.9

## 2022-10-28 HISTORY — DX: Hyperlipidemia, unspecified: E78.5

## 2022-10-28 HISTORY — DX: Benign prostatic hyperplasia without lower urinary tract symptoms: N40.0

## 2022-10-28 HISTORY — DX: Transient cerebral ischemic attack, unspecified: G45.9

## 2022-10-28 HISTORY — DX: Gastro-esophageal reflux disease without esophagitis: K21.9

## 2022-10-28 HISTORY — DX: Testicular hypofunction: E29.1

## 2022-10-28 HISTORY — DX: Chronic kidney disease, stage 3 unspecified: N18.30

## 2022-10-28 HISTORY — PX: INSERTION OF MESH: SHX5868

## 2022-10-28 HISTORY — DX: Diaphragmatic hernia without obstruction or gangrene: K44.9

## 2022-10-28 LAB — GLUCOSE, CAPILLARY
Glucose-Capillary: 111 mg/dL — ABNORMAL HIGH (ref 70–99)
Glucose-Capillary: 168 mg/dL — ABNORMAL HIGH (ref 70–99)

## 2022-10-28 SURGERY — REPAIR, HERNIA, INGUINAL, BILATERAL, ROBOT-ASSISTED
Anesthesia: General | Site: Inguinal | Laterality: Right

## 2022-10-28 MED ORDER — ONDANSETRON HCL 4 MG/2ML IJ SOLN
INTRAMUSCULAR | Status: DC | PRN
Start: 2022-10-28 — End: 2022-10-28
  Administered 2022-10-28: 4 mg via INTRAVENOUS

## 2022-10-28 MED ORDER — PROPOFOL 10 MG/ML IV BOLUS
INTRAVENOUS | Status: DC | PRN
Start: 2022-10-28 — End: 2022-10-28
  Administered 2022-10-28: 90 mg via INTRAVENOUS

## 2022-10-28 MED ORDER — FENTANYL CITRATE (PF) 100 MCG/2ML IJ SOLN
INTRAMUSCULAR | Status: AC
  Filled 2022-10-28: qty 2

## 2022-10-28 MED ORDER — CEFAZOLIN SODIUM-DEXTROSE 2-4 GM/100ML-% IV SOLN
INTRAVENOUS | Status: AC
  Filled 2022-10-28: qty 100

## 2022-10-28 MED ORDER — FENTANYL CITRATE (PF) 100 MCG/2ML IJ SOLN
25.0000 ug | INTRAMUSCULAR | Status: DC | PRN
  Administered 2022-10-28 (×2): 25 ug via INTRAVENOUS
  Administered 2022-10-28: 50 ug via INTRAVENOUS
  Administered 2022-10-28: 25 ug via INTRAVENOUS

## 2022-10-28 MED ORDER — OXYCODONE HCL 5 MG/5ML PO SOLN: 5.0000 mg | Freq: Once | ORAL | Status: AC | PRN

## 2022-10-28 MED ORDER — LIDOCAINE HCL (PF) 2 % IJ SOLN
INTRAMUSCULAR | Status: AC
  Filled 2022-10-28: qty 5

## 2022-10-28 MED ORDER — FAMOTIDINE 20 MG PO TABS
20.0000 mg | ORAL_TABLET | Freq: Once | ORAL | Status: AC
  Administered 2022-10-28: 20 mg via ORAL

## 2022-10-28 MED ORDER — BUPIVACAINE-EPINEPHRINE (PF) 0.25% -1:200000 IJ SOLN
INTRAMUSCULAR | Status: AC
  Filled 2022-10-28: qty 30

## 2022-10-28 MED ORDER — ACETAMINOPHEN 10 MG/ML IV SOLN
1000.0000 mg | INTRAVENOUS | Status: AC
  Administered 2022-10-28: 1000 mg via INTRAVENOUS

## 2022-10-28 MED ORDER — ROCURONIUM BROMIDE 10 MG/ML (PF) SYRINGE
PREFILLED_SYRINGE | INTRAVENOUS | Status: AC
  Filled 2022-10-28: qty 10

## 2022-10-28 MED ORDER — CHLORHEXIDINE GLUCONATE 0.12 % MT SOLN
OROMUCOSAL | Status: AC
  Filled 2022-10-28: qty 15

## 2022-10-28 MED ORDER — CEFAZOLIN SODIUM-DEXTROSE 2-4 GM/100ML-% IV SOLN
2.0000 g | INTRAVENOUS | Status: AC
  Administered 2022-10-28: 2 g via INTRAVENOUS

## 2022-10-28 MED ORDER — CHLORHEXIDINE GLUCONATE 0.12 % MT SOLN
15.0000 mL | Freq: Once | OROMUCOSAL | Status: AC
  Administered 2022-10-28: 15 mL via OROMUCOSAL

## 2022-10-28 MED ORDER — HYDROCODONE-ACETAMINOPHEN 5-325 MG PO TABS: 1.0000 | ORAL_TABLET | ORAL | 0 refills | Status: AC | PRN

## 2022-10-28 MED ORDER — GLYCOPYRROLATE 0.2 MG/ML IJ SOLN
INTRAMUSCULAR | Status: DC | PRN
Start: 2022-10-28 — End: 2022-10-28
  Administered 2022-10-28: .2 mg via INTRAVENOUS

## 2022-10-28 MED ORDER — SODIUM CHLORIDE 0.9 % IV SOLN: INTRAVENOUS | Status: DC

## 2022-10-28 MED ORDER — ACETAMINOPHEN 10 MG/ML IV SOLN
INTRAVENOUS | Status: AC
  Filled 2022-10-28: qty 100

## 2022-10-28 MED ORDER — OXYCODONE HCL 5 MG PO TABS
5.0000 mg | ORAL_TABLET | Freq: Once | ORAL | Status: AC | PRN
  Administered 2022-10-28: 5 mg via ORAL

## 2022-10-28 MED ORDER — EPHEDRINE SULFATE-NACL 50-0.9 MG/10ML-% IV SOSY
PREFILLED_SYRINGE | INTRAVENOUS | Status: DC | PRN
Start: 2022-10-28 — End: 2022-10-28
  Administered 2022-10-28: 5 mg via INTRAVENOUS

## 2022-10-28 MED ORDER — FAMOTIDINE 20 MG PO TABS
ORAL_TABLET | ORAL | Status: AC
  Filled 2022-10-28: qty 1

## 2022-10-28 MED ORDER — BUPIVACAINE-EPINEPHRINE 0.25% -1:200000 IJ SOLN
INTRAMUSCULAR | Status: DC | PRN
  Administered 2022-10-28: 30 mL

## 2022-10-28 MED ORDER — FENTANYL CITRATE (PF) 100 MCG/2ML IJ SOLN
INTRAMUSCULAR | Status: DC | PRN
  Administered 2022-10-28 (×2): 50 ug via INTRAVENOUS

## 2022-10-28 MED ORDER — PROPOFOL 10 MG/ML IV BOLUS
INTRAVENOUS | Status: AC
  Filled 2022-10-28: qty 20

## 2022-10-28 MED ORDER — OXYCODONE HCL 5 MG PO TABS
ORAL_TABLET | ORAL | Status: AC
  Filled 2022-10-28: qty 1

## 2022-10-28 MED ORDER — ONDANSETRON HCL 4 MG/2ML IJ SOLN
INTRAMUSCULAR | Status: AC
  Filled 2022-10-28: qty 2

## 2022-10-28 MED ORDER — ORAL CARE MOUTH RINSE: 15.0000 mL | Freq: Once | OROMUCOSAL | Status: AC

## 2022-10-28 MED ORDER — PHENYLEPHRINE 80 MCG/ML (10ML) SYRINGE FOR IV PUSH (FOR BLOOD PRESSURE SUPPORT)
PREFILLED_SYRINGE | INTRAVENOUS | Status: DC | PRN
  Administered 2022-10-28 (×2): 80 ug via INTRAVENOUS

## 2022-10-28 MED ORDER — 0.9 % SODIUM CHLORIDE (POUR BTL) OPTIME
TOPICAL | Status: DC | PRN
  Administered 2022-10-28: 500 mL

## 2022-10-28 MED ORDER — DEXAMETHASONE SODIUM PHOSPHATE 10 MG/ML IJ SOLN
INTRAMUSCULAR | Status: DC | PRN
  Administered 2022-10-28: 5 mg via INTRAVENOUS

## 2022-10-28 MED ORDER — DEXAMETHASONE SODIUM PHOSPHATE 10 MG/ML IJ SOLN
INTRAMUSCULAR | Status: AC
  Filled 2022-10-28: qty 1

## 2022-10-28 MED ORDER — LIDOCAINE HCL (PF) 2 % IJ SOLN
INTRAMUSCULAR | Status: DC | PRN
Start: 2022-10-28 — End: 2022-10-28
  Administered 2022-10-28: 60 mg via INTRADERMAL

## 2022-10-28 MED ORDER — ROCURONIUM BROMIDE 100 MG/10ML IV SOLN
INTRAVENOUS | Status: DC | PRN
Start: 2022-10-28 — End: 2022-10-28
  Administered 2022-10-28: 10 mg via INTRAVENOUS
  Administered 2022-10-28: 50 mg via INTRAVENOUS

## 2022-10-28 MED ORDER — SUGAMMADEX SODIUM 200 MG/2ML IV SOLN
INTRAVENOUS | Status: DC | PRN
Start: 2022-10-28 — End: 2022-10-28
  Administered 2022-10-28: 200 mg via INTRAVENOUS

## 2022-10-28 SURGICAL SUPPLY — 52 items
ADH SKN CLS APL DERMABOND .7 (GAUZE/BANDAGES/DRESSINGS) ×2
BAG PRESSURE INF REUSE 1000 (BAG) IMPLANT
BLADE SURG SZ11 CARB STEEL (BLADE) ×3 IMPLANT
COVER TIP SHEARS 8 DVNC (MISCELLANEOUS) ×3 IMPLANT
COVER WAND RF STERILE (DRAPES) ×3 IMPLANT
DERMABOND ADVANCED .7 DNX12 (GAUZE/BANDAGES/DRESSINGS) ×3 IMPLANT
DRAPE ARM DVNC X/XI (DISPOSABLE) ×9 IMPLANT
DRAPE COLUMN DVNC XI (DISPOSABLE) ×3 IMPLANT
ELECT REM PT RETURN 9FT ADLT (ELECTROSURGICAL) ×2
ELECTRODE REM PT RTRN 9FT ADLT (ELECTROSURGICAL) ×3 IMPLANT
FORCEPS BPLR R/ABLATION 8 DVNC (INSTRUMENTS) ×3 IMPLANT
GLOVE BIO SURGEON STRL SZ 6.5 (GLOVE) ×6 IMPLANT
GLOVE BIOGEL PI IND STRL 6.5 (GLOVE) ×6 IMPLANT
GOWN STRL REUS W/ TWL LRG LVL3 (GOWN DISPOSABLE) ×9 IMPLANT
GOWN STRL REUS W/TWL LRG LVL3 (GOWN DISPOSABLE) ×6
IRRIGATOR SUCT 8 DISP DVNC XI (IRRIGATION / IRRIGATOR) IMPLANT
IV CATH ANGIO 12GX3 LT BLUE (NEEDLE) IMPLANT
IV NS 1000ML (IV SOLUTION)
IV NS 1000ML BAXH (IV SOLUTION) IMPLANT
KIT PINK PAD W/HEAD ARE REST (MISCELLANEOUS) ×2
KIT PINK PAD W/HEAD ARM REST (MISCELLANEOUS) ×3 IMPLANT
LABEL OR SOLS (LABEL) IMPLANT
MANIFOLD NEPTUNE II (INSTRUMENTS) ×3 IMPLANT
MESH 3DMAX MID 4X6 LT LRG (Mesh General) IMPLANT
MESH 3DMAX MID 4X6 RT LRG (Mesh General) IMPLANT
MESH 3DMAX MID 5X7 LT XLRG (Mesh General) IMPLANT
MESH 3DMAX MID 5X7 RT XLRG (Mesh General) IMPLANT
NDL DRIVE SUT CUT DVNC (INSTRUMENTS) ×3 IMPLANT
NDL HYPO 22X1.5 SAFETY MO (MISCELLANEOUS) ×3 IMPLANT
NDL INSUFFLATION 14GA 120MM (NEEDLE) ×3 IMPLANT
NEEDLE DRIVE SUT CUT DVNC (INSTRUMENTS) ×2 IMPLANT
NEEDLE HYPO 22X1.5 SAFETY MO (MISCELLANEOUS) ×2 IMPLANT
NEEDLE INSUFFLATION 14GA 120MM (NEEDLE) ×2 IMPLANT
OBTURATOR OPTICAL STND 8 DVNC (TROCAR) ×2
OBTURATOR OPTICALSTD 8 DVNC (TROCAR) ×3 IMPLANT
PACK LAP CHOLECYSTECTOMY (MISCELLANEOUS) ×3 IMPLANT
SCISSORS MNPLR CVD DVNC XI (INSTRUMENTS) ×3 IMPLANT
SEAL UNIV 5-12 XI (MISCELLANEOUS) ×9 IMPLANT
SET TUBE SMOKE EVAC HIGH FLOW (TUBING) ×3 IMPLANT
SOL ELECTROSURG ANTI STICK (MISCELLANEOUS) ×2
SOLUTION ELECTROSURG ANTI STCK (MISCELLANEOUS) ×3 IMPLANT
SUT MNCRL 4-0 (SUTURE) ×2
SUT MNCRL 4-0 27XMFL (SUTURE) ×2
SUT VIC AB 2-0 SH 27 (SUTURE) ×2
SUT VIC AB 2-0 SH 27XBRD (SUTURE) ×3 IMPLANT
SUT VIC AB 2-0 UR6 27 (SUTURE) IMPLANT
SUT VLOC 90 S/L VL9 GS22 (SUTURE) ×3 IMPLANT
SUTURE MNCRL 4-0 27XMF (SUTURE) ×3 IMPLANT
TAPE TRANSPORE STRL 2 31045 (GAUZE/BANDAGES/DRESSINGS) IMPLANT
TRAP FLUID SMOKE EVACUATOR (MISCELLANEOUS) ×3 IMPLANT
TRAY FOLEY MTR SLVR 16FR STAT (SET/KITS/TRAYS/PACK) ×3 IMPLANT
WATER STERILE IRR 500ML POUR (IV SOLUTION) ×3 IMPLANT

## 2022-10-28 NOTE — Transfer of Care (Signed)
Immediate Anesthesia Transfer of Care Note  Patient: [name]  Procedure(s) Performed: XI ROBOTIC ASSISTED BILATERAL INGUINAL HERNIA (Right: Groin) INSERTION OF MESH (Right: Inguinal)  Patient Location: PACU  Anesthesia Type:General  Level of Consciousness: awake  Airway & Oxygen Therapy: Patient Spontanous Breathing and Patient connected to nasal cannula oxygen  Post-op Assessment: Report given to RN and Post -op Vital signs reviewed and stable  Post vital signs: Reviewed and stable  Last Vitals:  Vitals Value Taken Time  BP 111/49 10/28/22 0901  Temp 36.3 C 10/28/22 0900  Pulse 68 10/28/22 0904  Resp 25 10/28/22 0904  SpO2 100 % 10/28/22 0904  Vitals shown include unfiled device data.  Last Pain:  Vitals:   10/28/22 0635  TempSrc: Temporal  PainSc: 0-No pain         Complications: No notable events documented.

## 2022-10-28 NOTE — Op Note (Signed)
Preoperative diagnosis: Right inguinal hernia.   Postoperative diagnosis: Right inguinal hernia.  Procedure: Robotic assisted Laparoscopic Transabdominal preperitoneal laparoscopic (TAPP) repair of right inguinal hernia.  Anesthesia: GETA  Surgeon: Dr. Hazle Quant  Wound Classification: Clean  Indications:  Patient is a 89 y.o. male developed a symptomatic right inguinal hernia. Repair was indicated.  Findings: 1. Right indirect Inguinal hernia identified 2. Vas deferens and cord structures identified and preserved 3. Bard Extra Large 3D Max MID Anatomical mesh used for repair 4. Left groin with mesh in right position, no sign of hernia 5. Adequate hemostasis.   Description of procedure:  The patient was taken to the operating room and the correct side of surgery was verified. The patient was placed supine with right arm tucked at the side. After obtaining adequate anesthesia, the patient's abdomen was prepped and draped in standard sterile fashion. A time-out was completed verifying correct patient, procedure, site, positioning, and implant(s) and/or special equipment prior to beginning this procedure.  An incision was made in a natural skin line above the umbilicus. The fascia was elevated and the Veress needle inserted. Proper position was confirmed by aspiration and saline meniscus test.  The abdomen was insufflated with carbon dioxide to a pressure of 15 mmHg. The patient tolerated insufflation well.  Abdominal cavity was entered using Optiview technique with a millimeter trocar.  No injury was identified.  Another 2 mm trocars were placed lateral to each rectus muscle.  Scissors and bipolar forceps were inserted under direct visualization. At the robotic console: Transverse peritoneal incision is made about 8 cm superior to the inguinal defect. Medial to the epigastric vessels, the parietal compartment is dissected to visualize the rectus muscle. This is carried down to the symphysis  pubis and the retropubic space is dissected to expose at least 2 cm contralateral to the midline. Cooper's ligament is exposed and cleared at least 2 cm below the ligament to ensure adequate space for the inferior border of the mesh. Hesselbach's triangle is cleared assessing for a direct hernia. Lateral to the epigastric vessels, the dissection is carried out in visceral compartment continuing in the true preperitoneal plane. Indirect hernia sac, was carefully reduced and separated from the cord structures with medial retraction and a combination of blunt/sharp dissection and focused cautery. This dissection was continued until the cord structures are "parietalized" completely, allowing for visualization of the reflected peritoneum that is continuous with the line originating 2 cm below Coopers medially and across the psoas muscle in the lateral compartment.  The internal ring was interrogated for a cord lipoma. The cord lipoma was reduced to the retroperitoneum and seated dorsal to the preperitoneal mesh. Having achieved a complete dissection with a critical view of the entire myopectineal orifice, an XL mesh was then positioned centered at the iliopubic tract with the medial side crossing the midline and the inferior edge positioned 2 cm below Coopers ligament. The lateral aspect of the mesh extended 3-5 cm beyond the lateral edge of the psoas. The mesh is fixated using an interrupted suture placed to the ipsilateral Coopers ligament. A second suture was done at the medial superior aspect of the mesh fixating this to the rectus complex.  The peritoneal flap is closed with running barbed suture. Additional holes in the peritoneum closed with suture. Preperitoneal space gas aspirated to visualize the peritoneum apposed directly against the mesh and ensure no folding, lifting, or buckling of the mesh. Skin is closed, sterile dressings are applied.  The patient tolerated the  procedure well and was taken to the  postanesthesia care unit in stable condition  Specimen: None  Complications: None  Estimated Blood Loss: 5 mL

## 2022-10-28 NOTE — Discharge Instructions (Addendum)
?  Diet: Resume home heart healthy regular diet.  ? ?Activity: No heavy lifting >20 pounds (children, pets, laundry, garbage) or strenuous activity until follow-up, but light activity and walking are encouraged. Do not drive or drink alcohol if taking narcotic pain medications. ? ?Wound care: May shower with soapy water and pat dry (do not rub incisions), but no baths or submerging incision underwater until follow-up. (no swimming)  ? ?Medications: Resume all home medications. For mild to moderate pain: acetaminophen (Tylenol) or ibuprofen (if no kidney disease). Combining Tylenol with alcohol can substantially increase your risk of causing liver disease. Narcotic pain medications, if prescribed, can be used for severe pain, though may cause nausea, constipation, and drowsiness. Do not combine Tylenol and Norco within a 6 hour period as Norco contains Tylenol. If you do not need the narcotic pain medication, you do not need to fill the prescription. ? ?Call office ([phone]) at any time if any questions, worsening pain, fevers/chills, bleeding, drainage from incision site, or other concerns. ? ? ? ?AMBULATORY SURGERY  ?DISCHARGE INSTRUCTIONS ? ? ?The drugs that you were given will stay in your system until tomorrow so for the next 24 hours you should not: ? ?Drive an automobile ?Make any legal decisions ?Drink any alcoholic beverage ? ? ?You may resume regular meals tomorrow.  Today it is better to start with liquids and gradually work up to solid foods. ? ?You may eat anything you prefer, but it is better to start with liquids, then soup and crackers, and gradually work up to solid foods. ? ? ?Please notify your doctor immediately if you have any unusual bleeding, trouble breathing, redness and pain at the surgery site, drainage, fever, or pain not relieved by medication. ?  ? ? ? ?Additional Instructions:  ? ?

## 2022-10-28 NOTE — Anesthesia Postprocedure Evaluation (Signed)
Anesthesia Post Note  Patient: [name]  Procedure(s) Performed: XI ROBOTIC ASSISTED RIGHT INGUINAL HERNIA (Right: Groin) INSERTION OF MESH (Right: Inguinal)  Patient location during evaluation: PACU Anesthesia Type: General Level of consciousness: awake and alert Pain management: pain level controlled Vital Signs Assessment: post-procedure vital signs reviewed and stable Respiratory status: spontaneous breathing, nonlabored ventilation, respiratory function stable and patient connected to nasal cannula oxygen Cardiovascular status: blood pressure returned to baseline and stable Postop Assessment: no apparent nausea or vomiting Anesthetic complications: no   No notable events documented.   Last Vitals:  Vitals:   10/28/22 0956 10/28/22 1119  BP: (!) 141/73 137/83  Pulse: 68 67  Resp: 18   Temp: (!) 36 C   SpO2: 98% 100%    Last Pain:  Vitals:   10/28/22 1119  TempSrc:   PainSc: 2                  Cleda Mccreedy [name]

## 2022-10-28 NOTE — Anesthesia Procedure Notes (Signed)
Procedure Name: Intubation Date/Time: 10/28/2022 7:37 AM  Performed by: Monico Hoar, CRNAPre-anesthesia Checklist: Patient identified, Patient being monitored, Timeout performed, Emergency Drugs available and Suction available Patient Re-evaluated:Patient Re-evaluated prior to induction Oxygen Delivery Method: Circle system utilized Preoxygenation: Pre-oxygenation with 100% oxygen Induction Type: IV induction Ventilation: Mask ventilation without difficulty Laryngoscope Size: Mac and 4 Grade View: Grade I Tube type: Oral Tube size: 7.5 mm Number of attempts: 1 Airway Equipment and Method: Stylet Placement Confirmation: ETT inserted through vocal cords under direct vision, positive ETCO2 and breath sounds checked- equal and bilateral Secured at: 21 cm Tube secured with: Tape Dental Injury: Teeth and Oropharynx as per pre-operative assessment

## 2022-10-28 NOTE — Anesthesia Preprocedure Evaluation (Addendum)
Anesthesia Evaluation  Patient identified by MRN, date of birth, ID band Patient awake    Reviewed: Allergy & Precautions, NPO status , Patient's Chart, lab work & pertinent test results  History of Anesthesia Complications Negative for: history of anesthetic complications  Airway Mallampati: III  TM Distance: <3 FB Neck ROM: full    Dental  (+) Missing   Pulmonary neg shortness of breath, former smoker   Pulmonary exam normal        Cardiovascular Exercise Tolerance: Good hypertension, + CAD, + Past MI, + Cardiac Stents, + CABG and + Peripheral Vascular Disease  Normal cardiovascular exam     Neuro/Psych TIA Neuromuscular disease  negative psych ROS   GI/Hepatic Neg liver ROS, hiatal hernia, PUD,GERD  Controlled,,  Endo/Other  diabetes, Type 2    Renal/GU Renal disease     Musculoskeletal   Abdominal   Peds  Hematology negative hematology ROS (+)   Anesthesia Other Findings Past Medical History: No date: AAA (abdominal aortic aneurysm) (HCC)     Comment:  a.) s/p repair 12/26/2000 at [facility] No date: Actinic keratosis 07/01/1988: Acute anterior wall MI (HCC)     Comment:  a.) LHC 07/01/1988: 95/100/95% pRCA, 95% mRCA, 95% dRCA,              95% acute marginal, 95% pLCx, 25% RA, 100% mLAD, 50% D1               --> PTCA mLAD reduced stenosis to < 25%; b.) repeat LHC               07/07/1988: 95% pRCA, 95% mRCA, 75% dRCA, 25% LM, 50%               pLCx, 50% RI, 100% mLAD, 50% D1-1, 25% D1-2 -->               successful IRA revascularization (mLAD) --> consult CVTS;              c.) s/p 3v CABG 07/15/1988 No date: Appendicitis No date: Arthritis No date: Atrial fibrillation (HCC)     Comment:  a.) CHA2DS2-VASc = 8 (age x2, sex, HFpEF, HTN, TIA x 2,               prior MI, T2DM); b.) cardiac rate/rhythm maintained on               oral diltiazem; chronically anticoagulated using apixaban 09/01/2021: Basal cell  carcinoma     Comment:  Right medial canthus/lower eyelid. Nodular BCC. Excixed               09/01/2021 No date: Bilateral recurrent inguinal hernia No date: BPH (benign prostatic hyperplasia) No date: CKD (chronic kidney disease), stage III (HCC) 06/1988: Coronary artery disease     Comment:  a.) anterior MI 07/01/1988 --> LHC: 95/100/95% pRCA, 95%              mRCA, 95% dRCA, 95% acute marginal, 95% pLCx, 25% RA,               100% mLAD, 50% D1  (PTCA mLAD); b.) b.) repeat LHC               07/07/1988: 95% pRCA, 95% mRCA, 75% dRCA, 25% LM, 50%               pLCx, 50% RI, 100% mLAD, 50% D1-1, 25% D1-2--> successful  IRA revascularization (mLAD) --> consult CVTS; c.) s/p 3v              CABG 07/15/1988 No date: GERD (gastroesophageal reflux disease) No date: HFrEF (heart failure with reduced ejection fraction) (HCC)     Comment:  a.) TTE 03/25/2014: EF 40%, glob HK, G1DD, mild LAE,               mild MR/TR, triv PR; b.) TTE 03/26/2015: EF 50-55%,               apical anterior and apical HK, mod LAE, mild MR; c.) TTE               05/27/2019: EF 40-45%, no RWMAs, PASP 45.8, mild-mod               MR/TR; d.) TTE 12/30/2020: EF 45%, ant/septal HK, mild               LAE, triv AR, mild MR/TR/PR No date: Hiatal hernia No date: History of kidney stones No date: HLD (hyperlipidemia) No date: HTN (hypertension) 06/30/2016: Hx of basal cell carcinoma     Comment:  L proximal mandible 07/19/2016: Hx of basal cell carcinoma     Comment:  L medial lower eyelid 09/20/2016: Hx of basal cell carcinoma     Comment:  R upper back lateral 12/01/2016: Hx of basal cell carcinoma     Comment:  L post shoulder sup medial scapula 12/01/2016: Hx of basal cell carcinoma     Comment:  L forehead, 2.0cm above med brow 12/17/2018: Hx of basal cell carcinoma     Comment:  L forehead aboove medial brow 08/16/2016: Hx of melanoma in situ     Comment:  L proximal mandible/infra auricular No date:  Hypogonadism in male No date: Ischemic heart disease No date: Kidney stone No date: Left inguinal hernia No date: On apixaban therapy No date: Peripheral vascular disease (HCC) No date: PUD (peptic ulcer disease) 07/15/1988: S/P CABG x 3     Comment:  a.) LIMA-LAD, SVG-RCA, SVG-RI No date: Spinal stenosis 11/01/2017: Squamous cell carcinoma of skin     Comment:  R superior sideburn in hairline 01/03/2018: Squamous cell carcinoma of skin     Comment:  L medial dorsum wrist lateral and medial 01/03/2018: Squamous cell carcinoma of skin     Comment:  L proximal thumb 04/12/2018: Squamous cell carcinoma of skin     Comment:  L dorsum proximal index finger 04/12/2018: Squamous cell carcinoma of skin     Comment:  L dorsum medial proximal thumb 07/04/2018: Squamous cell carcinoma of skin     Comment:  R superior sideburn 07/04/2018: Squamous cell carcinoma of skin     Comment:  L dorsum proximal index finger 11/13/2018: Squamous cell carcinoma of skin     Comment:  R helix 03/05/2020: Squamous cell carcinoma of skin     Comment:  scalp No date: Stage 3b chronic kidney disease (HCC) No date: T2DM (type 2 diabetes mellitus) (HCC) No date: TIA (transient ischemic attack) 1990: Transfusion history     Comment:  received several units of blood after CABG  Past Surgical History: 12/26/2000: ABDOMINAL AORTIC ANEURYSM REPAIR     Comment:  Procedure: ABDOMINAL AORTIC ANEURYSM REPAIR; Location:               [facility] No date: APPENDECTOMY No date: BRAIN SURGERY No date: CATARACT EXTRACTION W/ INTRAOCULAR LENS IMPLANT; Bilateral 11/10/2008: CHOLECYSTECTOMY; N/A     Comment:  Dr Lemar Livings 07/15/1988: CORONARY ARTERY BYPASS GRAFT     Comment:  Procedure: CORONARY ARTERY BYPASS GRAFT; Location: [facility];              Surgeon: H. Jorge Mandril, MD 05/29/2019: ENDARTERECTOMY FEMORAL; Right     Comment:  Procedure: ENDARTERECTOMY FEMORAL;  Surgeon: Annice Needy, MD;  Location: [facility];   Service: Vascular;                Laterality: Right; 08/07/2017: INGUINAL HERNIA REPAIR; Left     Comment:  Medium Ultra Pro mesh Surgeon: Earline Mayotte, MD;                Location: [facility];  Service: General;  Laterality: Left; 05/29/2019: INSERTION OF ILIAC STENT; Right     Comment:  Procedure: INSERTION OF ILIAC STENT ( AND SFA STENT);                Surgeon: Annice Needy, MD;  Location: [facility];  Service:              Vascular;  Laterality: Right; 07/07/1988: LEFT HEART CATH AND CORONARY ANGIOGRAPHY; Left     Comment:  Procedure: LEFT HEART CATHETERIZATION AND CORONARY               ANGIOPLASTY WITH INFARCT RELATED ARTERY (IRA)               REVASCULARIZATION; Location: [facility]; Surgeon: Ned Clines,              MD 05/27/2019: LOWER EXTREMITY ANGIOGRAPHY; Right     Comment:  Procedure: LOWER EXTREMITY ANGIOGRAPHY;  Surgeon: Annice Needy, MD;  Location: [facility];  Service:               Cardiovascular;  Laterality: Right; 07/01/1988: PTCA; Left     Comment:  Procedure: LEFT HEART CATH AND CORONARY ANGIOGRAPHY WITH              PTCA; Location: [facility]; Surgeon: Lieutenant Diego, MD 06/22/2022: XI ROBOTIC LAPAROSCOPIC ASSISTED APPENDECTOMY; N/A     Comment:  Procedure: XI ROBOTIC LAPAROSCOPIC ASSISTED               APPENDECTOMY;  Surgeon: Carolan Shiver, MD;                Location: [facility];  Service: General;  Laterality: N/A;  BMI    Body Mass Index: 20.21 kg/m      Reproductive/Obstetrics negative OB ROS                             Anesthesia Physical Anesthesia Plan  ASA: 3  Anesthesia Plan: General ETT   Post-op Pain Management:    Induction: Intravenous  PONV Risk Score and Plan: Ondansetron, Dexamethasone, Midazolam and Treatment may vary due to age or medical condition  Airway Management Planned: Oral ETT  Additional Equipment:   Intra-op Plan:   Post-operative Plan: Extubation in OR  Informed  Consent: I have reviewed the patients History and Physical, chart, labs and discussed the procedure including the risks, benefits and alternatives for the proposed anesthesia with the patient or authorized representative who has indicated his/her understanding and acceptance.     Dental Advisory Given  Plan Discussed with: Anesthesiologist, CRNA  and Surgeon  Anesthesia Plan Comments: (Patient has cardiac clearance for this procedure.   Patient consented for risks of anesthesia including but not limited to:  - adverse reactions to medications - damage to eyes, teeth, lips or other oral mucosa - nerve damage due to positioning  - sore throat or hoarseness - Damage to heart, brain, nerves, lungs, other parts of body or loss of life  Patient voiced understanding.)       Anesthesia Quick Evaluation

## 2022-10-28 NOTE — Interval H&P Note (Signed)
History and Physical Interval Note:  10/28/2022 6:54 AM  [name]  has presented today for surgery, with the diagnosis of K40.21 bil recurrent inguinal hernia w/o obstruction or gangrene.  The various methods of treatment have been discussed with the patient and family. After consideration of risks, benefits and other options for treatment, the patient has consented to  Procedure(s): XI ROBOTIC ASSISTED BILATERAL INGUINAL HERNIA (Bilateral) as a surgical intervention.  The patient's history has been reviewed, patient examined, no change in status, stable for surgery.  I have reviewed the patient's chart and labs.  Questions were answered to the patient's satisfaction.     Carolan Shiver

## 2022-12-08 ENCOUNTER — Ambulatory Visit: Payer: Medicare PPO | Admitting: Dermatology

## 2022-12-08 DIAGNOSIS — Z7189 Other specified counseling: Secondary | ICD-10-CM

## 2022-12-08 DIAGNOSIS — D692 Other nonthrombocytopenic purpura: Secondary | ICD-10-CM | POA: Diagnosis not present

## 2022-12-08 DIAGNOSIS — L820 Inflamed seborrheic keratosis: Secondary | ICD-10-CM | POA: Diagnosis not present

## 2022-12-08 DIAGNOSIS — L570 Actinic keratosis: Secondary | ICD-10-CM | POA: Diagnosis not present

## 2022-12-08 DIAGNOSIS — Z8589 Personal history of malignant neoplasm of other organs and systems: Secondary | ICD-10-CM

## 2022-12-08 DIAGNOSIS — W908XXA Exposure to other nonionizing radiation, initial encounter: Secondary | ICD-10-CM

## 2022-12-08 DIAGNOSIS — L578 Other skin changes due to chronic exposure to nonionizing radiation: Secondary | ICD-10-CM | POA: Diagnosis not present

## 2022-12-08 DIAGNOSIS — Z5111 Encounter for antineoplastic chemotherapy: Secondary | ICD-10-CM

## 2022-12-08 DIAGNOSIS — Z79899 Other long term (current) drug therapy: Secondary | ICD-10-CM

## 2022-12-08 DIAGNOSIS — Z85828 Personal history of other malignant neoplasm of skin: Secondary | ICD-10-CM

## 2022-12-08 NOTE — Progress Notes (Signed)
Follow-Up Visit   Subjective  [name] is a 89 y.o. male who presents for the following: Actinic keratosis of the face and scalp. He has been spot treating 5FU/Calcipotriene cream areas on his face and scalp. He has one spot on his scalp that has gotten smaller, but hasn't gone away.  The patient has spots, moles and lesions to be evaluated, some may be new or changing and the patient may have concern these could be cancer.      The following portions of the chart were reviewed this encounter and updated as appropriate: medications, allergies, medical history  Review of Systems:  No other skin or systemic complaints except as noted in HPI or Assessment and Plan.  Objective  Well appearing patient in no apparent distress; mood and affect are within normal limits.  A focused examination was performed of the following areas: Face, scalp, ears  Relevant exam findings are noted in the Assessment and Plan.  scalp x 3 (3) Erythematous stuck-on, waxy papule or plaque  face and scalp x 23 (23) Erythematous thin papules/macules with gritty scale.     Assessment & Plan   Inflamed seborrheic keratosis (3) scalp x 3  Symptomatic, irritating, patient would like treated.  Destruction of lesion - scalp x 3 (3) Complexity: simple   Destruction method: cryotherapy   Informed consent: discussed and consent obtained   Timeout:  patient name, date of birth, surgical site, and procedure verified Lesion destroyed using liquid nitrogen: Yes   Region frozen until ice ball extended beyond lesion: Yes   Outcome: patient tolerated procedure well with no complications   Post-procedure details: wound care instructions given    AK (actinic keratosis) (23) face and scalp x 23  Actinic keratoses are precancerous spots that appear secondary to cumulative UV radiation exposure/sun exposure over time. They are chronic with expected duration over 1 year. A portion of actinic keratoses will  progress to squamous cell carcinoma of the skin. It is not possible to reliably predict which spots will progress to skin cancer and so treatment is recommended to prevent development of skin cancer.  Recommend daily broad spectrum sunscreen SPF 30+ to sun-exposed areas, reapply every 2 hours as needed.  Recommend staying in the shade or wearing long sleeves, sun glasses (UVA+UVB protection) and wide brim hats (4-inch brim around the entire circumference of the hat). Call for new or changing lesions.  Destruction of lesion - face and scalp x 23 (23) Complexity: simple   Destruction method: cryotherapy   Informed consent: discussed and consent obtained   Timeout:  patient name, date of birth, surgical site, and procedure verified Lesion destroyed using liquid nitrogen: Yes   Region frozen until ice ball extended beyond lesion: Yes   Outcome: patient tolerated procedure well with no complications   Post-procedure details: wound care instructions given      ACTINIC DAMAGE WITH PRECANCEROUS ACTINIC KERATOSES Counseling for Topical Chemotherapy Management: Patient exhibits: - Severe, confluent actinic changes with pre-cancerous actinic keratoses that is secondary to cumulative UV radiation exposure over time - Condition that is severe; chronic, not at goal. - diffuse scaly erythematous macules and papules with underlying dyspigmentation - Discussed Prescription "Field Treatment" topical Chemotherapy for Severe, Chronic Confluent Actinic Changes with Pre-Cancerous Actinic Keratoses Field treatment involves treatment of an entire area of skin that has confluent Actinic Changes (Sun/ Ultraviolet light damage) and PreCancerous Actinic Keratoses by method of PhotoDynamic Therapy (PDT) and/or prescription Topical Chemotherapy agents such as 5-fluorouracil, 5-fluorouracil/calcipotriene,  and/or imiquimod.  The purpose is to decrease the number of clinically evident and subclinical PreCancerous lesions to  prevent progression to development of skin cancer by chemically destroying early precancer changes that may or may not be visible.  It has been shown to reduce the risk of developing skin cancer in the treated area. As a result of treatment, redness, scaling, crusting, and open sores may occur during treatment course. One or more than one of these methods may be used and may have to be used several times to control, suppress and eliminate the PreCancerous changes. Discussed treatment course, expected reaction, and possible side effects. - Recommend daily broad spectrum sunscreen SPF 30+ to sun-exposed areas, reapply every 2 hours as needed.  - Staying in the shade or wearing long sleeves, sun glasses (UVA+UVB protection) and wide brim hats (4-inch brim around the entire circumference of the hat) are also recommended. - Call for new or changing lesions. - continue 5FU/Calcipotriene cream - patient spot treats scalp areas once to twice daily as needed to the face and scalp.    HISTORY OF BASAL CELL CARCINOMA OF THE SKIN - No evidence of recurrence today - Recommend regular full body skin exams - Recommend daily broad spectrum sunscreen SPF 30+ to sun-exposed areas, reapply every 2 hours as needed.  - Call if any new or changing lesions are noted between office visits  HISTORY OF SQUAMOUS CELL CARCINOMA OF THE SKIN - No evidence of recurrence today - No lymphadenopathy - Recommend regular full body skin exams - Recommend daily broad spectrum sunscreen SPF 30+ to sun-exposed areas, reapply every 2 hours as needed.  - Call if any new or changing lesions are noted between office visits  Purpura - Chronic; persistent and recurrent.  Treatable, but not curable. - Violaceous macules and patches - Benign - Related to trauma, age, sun damage and/or use of blood thinners, chronic use of topical and/or oral steroids - Observe - Can use OTC arnica containing moisturizer such as Dermend Bruise Formula if  desired - Call for worsening or other concerns    Return in about 6 months (around 06/07/2023) for AKs.  Wendee Beavers, CMA, am acting as scribe for Armida Sans, MD .   Documentation: I have reviewed the above documentation for accuracy and completeness, and I agree with the above.  Armida Sans, MD

## 2022-12-08 NOTE — Patient Instructions (Signed)

## 2022-12-17 ENCOUNTER — Encounter: Payer: Self-pay | Admitting: Dermatology

## 2022-12-19 ENCOUNTER — Encounter: Payer: Self-pay | Admitting: Dermatology

## 2022-12-19 ENCOUNTER — Ambulatory Visit: Payer: Medicare PPO | Admitting: Dermatology

## 2022-12-19 DIAGNOSIS — R21 Rash and other nonspecific skin eruption: Secondary | ICD-10-CM

## 2022-12-19 MED ORDER — HYDROCORTISONE 2.5 % EX CREA
TOPICAL_CREAM | Freq: Two times a day (BID) | CUTANEOUS | 1 refills | Status: AC
Start: 2022-12-19 — End: ?

## 2022-12-19 NOTE — Progress Notes (Signed)
   Follow-Up Visit   Subjective  [name] is a 89 y.o. male who presents for the following: swelling and peeling at face. Started the day after he saw Dr. Gwen Pounds on 11/14. Patient had 23 AK's treated that day at face and scalp. Prior he was treating with 5FU/calcipotriene. Patient said when he wakes up in the morning his eyes are stuck together, face is burning and itching.  The patient has spots, moles and lesions to be evaluated, some may be new or changing and the patient may have concern these could be cancer.   The following portions of the chart were reviewed this encounter and updated as appropriate: medications, allergies, medical history  Review of Systems:  No other skin or systemic complaints except as noted in HPI or Assessment and Plan.  Objective  Well appearing patient in no apparent distress; mood and affect are within normal limits.   A focused examination was performed of the following areas: Face and scalp  Relevant exam findings are noted in the Assessment and Plan.    Assessment & Plan           Rash Exam: Diffuse erythema, scaling, edema of forehead temples cheeks  Differential diagnosis:  Edema from cryotherapy   Treatment Plan: Start HC 2.5% cr BID to AA face.  Do not recommend using the 5FU/calcipotriene at this time.   Rash and other nonspecific skin eruption  Related Medications hydrocortisone 2.5 % cream Apply topically in the morning and at bedtime.    Return in about 1 week (around 12/26/2022) for with Dr. Kirtland Bouchard for rash follow up.  Anise Salvo, RMA, am acting as scribe for Elie Goody, MD .   Documentation: I have reviewed the above documentation for accuracy and completeness, and I agree with the above.  Elie Goody, MD

## 2022-12-19 NOTE — Patient Instructions (Addendum)
Do not recommend using the 5FU/calcipotriene at this time.  Start hydrocortisone 2.5% cream twice daily to affected areas at face.  Due to recent changes in healthcare laws, you may see results of your pathology and/or laboratory studies on MyChart before the doctors have had a chance to review them. We understand that in some cases there may be results that are confusing or concerning to you. Please understand that not all results are received at the same time and often the doctors may need to interpret multiple results in order to provide you with the best plan of care or course of treatment. Therefore, we ask that you please give [place] 2 business days to thoroughly review all your results before contacting the office for clarification. Should we see a critical lab result, you will be contacted sooner.   If You Need Anything After Your Visit  If you have any questions or concerns for your doctor, please call our main line at [phone] and press option 4 to reach your doctor's medical assistant. If no one answers, please leave a voicemail as directed and we will return your call as soon as possible. Messages left after 4 pm will be answered the following business day.   You may also send [place] a message via MyChart. We typically respond to MyChart messages within 1-2 business days.  For prescription refills, please ask your pharmacy to contact our office. Our fax number is [phone].  If you have an urgent issue when the clinic is closed that cannot wait until the next business day, you can page your doctor at the number below.    Please note that while we do our best to be available for urgent issues outside of office hours, we are not available 24/7.   If you have an urgent issue and are unable to reach [place], you may choose to seek medical care at your doctor's office, retail clinic, urgent care center, or emergency room.  If you have a medical emergency, please immediately call 911 or go to the  emergency department.  Pager Numbers  - Dr. Gwen Pounds: [phone]  - Dr. Roseanne Reno: [phone]  - Dr. Katrinka Blazing: [phone]   In the event of inclement weather, please call our main line at [phone] for an update on the status of any delays or closures.  Dermatology Medication Tips: Please keep the boxes that topical medications come in in order to help keep track of the instructions about where and how to use these. Pharmacies typically print the medication instructions only on the boxes and not directly on the medication tubes.   If your medication is too expensive, please contact our office at [phone] option 4 or send [place] a message through MyChart.   We are unable to tell what your co-pay for medications will be in advance as this is different depending on your insurance coverage. However, we may be able to find a substitute medication at lower cost or fill out paperwork to get insurance to cover a needed medication.   If a prior authorization is required to get your medication covered by your insurance company, please allow [place] 1-2 business days to complete this process.  Drug prices often vary depending on where the prescription is filled and some pharmacies may offer cheaper prices.  The website www.goodrx.com contains coupons for medications through different pharmacies. The prices here do not account for what the cost may be with help from insurance (it may be cheaper with your insurance), but the website can  give you the price if you did not use any insurance.  - You can print the associated coupon and take it with your prescription to the pharmacy.  - You may also stop by our office during regular business hours and pick up a GoodRx coupon card.  - If you need your prescription sent electronically to a different pharmacy, notify our office through [facility] or by phone at [phone] option 4.     Si Usted Necesita Algo Despus de Su Visita  Tambin puede  enviarnos un mensaje a travs de Clinical cytogeneticist. Por lo general respondemos a los mensajes de MyChart en el transcurso de 1 a 2 das hbiles.  Para renovar recetas, por favor pida a su farmacia que se ponga en contacto con nuestra oficina. Annie Sable de fax es [place] [phone].  Si tiene un asunto urgente cuando la clnica est cerrada y que no puede esperar hasta el siguiente da hbil, puede llamar/localizar a su doctor(a) al nmero que aparece a continuacin.   Por favor, tenga en cuenta que aunque hacemos todo lo posible para estar disponibles para asuntos urgentes fuera del horario de [place], no estamos disponibles las 24 horas del da, los 7 [address]  [address] de la [place].   Si tiene un problema urgente y no puede comunicarse con nosotros, puede optar por buscar atencin mdica  en el consultorio de su doctor(a), en una clnica privada, en un centro de atencin urgente o en una sala de emergencias.  Si tiene Engineer, drilling, por favor llame inmediatamente al 911 o vaya a la sala de emergencias.  Nmeros de bper  - Dr. Gwen Pounds: [phone]  - Dra. Roseanne Reno: [phone]  - Dr. Katrinka Blazing: [phone]   En caso de inclemencias del tiempo, por favor llame a Lacy Duverney principal al [phone] para una actualizacin sobre el [place] de cualquier retraso o cierre.  Consejos para la medicacin en dermatologa: Por favor, guarde las cajas en las que vienen los medicamentos de uso tpico para ayudarle a seguir las instrucciones sobre dnde y cmo usarlos. Las farmacias generalmente imprimen las instrucciones del medicamento slo en las cajas y no directamente en los tubos del [place].   Si su medicamento es muy caro, por favor, pngase en contacto con Rolm Gala llamando al [phone] y presione la opcin 4 o envenos un mensaje a travs de Clinical cytogeneticist.   No podemos decirle cul ser su copago por los medicamentos por adelantado ya que esto es diferente dependiendo de la cobertura de su  seguro. Sin embargo, es posible que podamos encontrar un medicamento sustituto a Audiological scientist un formulario para que el seguro cubra el medicamento que se considera necesario.   Si se requiere una autorizacin previa para que su compaa de seguros [place] su medicamento, por favor permtanos de 1 a 2 das hbiles para completar [address]  Los precios de los medicamentos varan con frecuencia dependiendo del Environmental consultant de dnde se surte la receta y alguna farmacias pueden ofrecer precios ms baratos.  El sitio web www.goodrx.com tiene cupones para medicamentos de Health and safety inspector. Los precios aqu no tienen en cuenta lo que podra costar con la ayuda del seguro (puede ser ms barato con su seguro), pero el sitio web puede darle el precio si no utiliz Tourist information centre manager.  - Puede imprimir el cupn correspondiente y llevarlo con su receta a la farmacia.  - Tambin puede pasar por nuestra oficina durante el horario de atencin regular y Education officer, museum una tarjeta de cupones de GoodRx.  -  Si necesita que su receta se enve electrnicamente a Psychiatrist, informe a nuestra oficina a travs de MyChart de [place] o por telfono llamando al [phone] y presione la opcin 4.

## 2022-12-27 ENCOUNTER — Telehealth: Payer: Self-pay | Admitting: Urology

## 2022-12-27 ENCOUNTER — Emergency Department: Payer: Medicare PPO

## 2022-12-27 ENCOUNTER — Emergency Department
Admission: EM | Admit: 2022-12-27 | Discharge: 2022-12-27 | Disposition: A | Discharge status: Home / Self Care | Payer: Medicare PPO | Attending: Emergency Medicine | Admitting: Emergency Medicine

## 2022-12-27 ENCOUNTER — Other Ambulatory Visit: Payer: Self-pay

## 2022-12-27 DIAGNOSIS — R42 Dizziness and giddiness: Secondary | ICD-10-CM | POA: Insufficient documentation

## 2022-12-27 DIAGNOSIS — I2510 Atherosclerotic heart disease of native coronary artery without angina pectoris: Secondary | ICD-10-CM | POA: Diagnosis not present

## 2022-12-27 DIAGNOSIS — I4891 Unspecified atrial fibrillation: Secondary | ICD-10-CM | POA: Diagnosis not present

## 2022-12-27 LAB — CBC
HCT: 37.3 % — ABNORMAL LOW (ref 39.0–52.0)
Hemoglobin: 12 g/dL — ABNORMAL LOW (ref 13.0–17.0)
MCH: 30.4 pg (ref 26.0–34.0)
MCHC: 32.2 g/dL (ref 30.0–36.0)
MCV: 94.4 fL (ref 80.0–100.0)
Platelets: 276 10*3/uL (ref 150–400)
RBC: 3.95 MIL/uL — ABNORMAL LOW (ref 4.22–5.81)
RDW: 13.8 % (ref 11.5–15.5)
WBC: 6.7 10*3/uL (ref 4.0–10.5)
nRBC: 0 % (ref 0.0–0.2)

## 2022-12-27 LAB — BASIC METABOLIC PANEL
Anion gap: 9 (ref 5–15)
BUN: 35 mg/dL — ABNORMAL HIGH (ref 8–23)
CO2: 30 mmol/L (ref 22–32)
Calcium: 9.8 mg/dL (ref 8.9–10.3)
Chloride: 98 mmol/L (ref 98–111)
Creatinine, Ser: 1.48 mg/dL — ABNORMAL HIGH (ref 0.61–1.24)
GFR, Estimated: 45 mL/min — ABNORMAL LOW (ref >60)
Glucose, Bld: 130 mg/dL — ABNORMAL HIGH (ref 70–99)
Potassium: 3.6 mmol/L (ref 3.5–5.1)
Sodium: 137 mmol/L (ref 135–145)

## 2022-12-27 LAB — TROPONIN I (HIGH SENSITIVITY): Troponin I (High Sensitivity): 16 ng/L (ref <18)

## 2022-12-27 MED ORDER — ONDANSETRON 4 MG PO TBDP: 4.0000 mg | ORAL_TABLET | Freq: Three times a day (TID) | ORAL | 0 refills | Status: AC | PRN

## 2022-12-27 NOTE — ED Provider Notes (Signed)
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  Patient describes over the last several days he has been having a sense of movement when turning over in his bed or when standing up quickly.  He reports this improves as the day goes on.  He denies lightheadedness.  Reports this started after upper respiratory infection which has essentially resolved at this point.  Reports this started after upper respiratory infection which has essentially resolved at this point.  No neurodeficits, no headache.     Physical Exam   Triage Vital Signs: ED Triage Vitals  Encounter Vitals Group     BP 12/27/22 1228 128/73     Systolic BP Percentile --      Diastolic BP Percentile --      Pulse Rate 12/27/22 1228 (!) 59     Resp 12/27/22 1228 19     Temp 12/27/22 1228 98.5 F (36.9 C)     Temp Source 12/27/22 1228 Oral     SpO2 12/27/22 1228 97 %     Weight 12/27/22 1227 64.4 kg (142 lb)     Height 12/27/22 1227 1.829 m (6')     Head Circumference --      Peak Flow --      Pain Score 12/27/22 1227 0     Pain Loc --      Pain Education --      Exclude from Growth Chart --     Most recent vital signs: Vitals:   12/27/22 1228  BP: 128/73  Pulse: (!) 59  Resp: 19  Temp: 98.5 F (36.9 C)  SpO2: 97%     General: Awake, no distress.  CV:  Good peripheral perfusion.  Resp:  Normal effort.  Abd:  No distention.  Other:  Cranial nerves II through XII are normal, normal neurologic exam, ambulates well without difficulty   ED Results / Procedures / Treatments   Labs (all labs ordered are listed, but onlCranial nerves II through XII are normal, normal neurologic exambnormal results are displayed) Labs Reviewed  BASIC METABOLIC PANEL -  Abnormal; Notable for the following components:      Result Value   Glucose, Bld 130 (*)    BUN 35 (*)    Creatinine, Ser 1.48 (*)    GFR, Estimated 45 (*)    All other components within normal limits  CBC - Abnormal; Notable for the following components:   RBC 3.95 (*)    Hemoglobin 12.0 (*)    HCT 37.3 (*)    All other components within normal limits  TROPONIN I (HIGH SENSITIVITY)     EKG  ED ECG REPORT I, Jene Every, the attending physician, personally viewed and interpreted this ECG.  Date: 12/27/2022  Rhythm: Atrial fibrillation normal rate QRS Axis: Axis Intervals: normal ST/T Wave abnormalities: normal Narrative Interpretation: no evidence of acute ischemia    RADIOLOGY CT head interpret by me, no evidence of ICH    PROCEDURES:  Critical Care performed:   Procedures   MEDICATIONS ORDERED IN ED: Medications - No data to display   IMPRESSION / MDM / ASSESSMENT AND PLAN / ED COURSE  I reviewed the triage vital signs and the nursing  notes. Patient's presentation is most consistent with acute presentation with potential threat to life or bodily function.  Patient presents with dizziness as detailed above, description appears consistent with vertigo, likely BPV, CVA on the differential but less likely.  Reassuring neurologic exam, lab works generally unremarkable.  Patient is asymptomatic at this time, sent for CT scan which does not demonstrate any acute abnormality.  Appropriate for discharge, will treat with ODT Zofran which can be helpful in vertigo, outpatient follow-up with PCP as needed    Clinical Course as of 12/27/22 1501  Tue Dec 27, 2022  1433 CT Head Wo Contrast [RK]    Clinical Course User Index [RK] Jene Every, MD     FINAL CLINICAL IMPRESSION(S) / ED DIAGNOSES   Final diagnoses:  Vertigo     Rx / DC Orders   ED Discharge Orders          Ordered    ondansetron (ZOFRAN-ODT) 4 MG disintegrating tablet  Every 8 hours  PRN        12/27/22 1435             Note:  This document was prepared using Dragon voice recognition software and may include unintentional dictation errors.   Jene Every, MD 12/27/22 1504

## 2022-12-27 NOTE — Telephone Encounter (Signed)
I attempted to reach [name] in regards to his abdominal aneurysm that was seen on the CT scan that he had in the emergency department over the summer.  The radiologist recommended that he have follow-up imaging every 6 months and I do not see in his chart where that has been completed.  He needs to reach out to his primary care provider to get the appropriate imaging studies done or referral to a vascular surgeon.

## 2022-12-27 NOTE — ED Triage Notes (Signed)
Pt sts that he has been having congestion and most recent dizziness with changes of planes.

## 2022-12-27 NOTE — ED Triage Notes (Signed)
First nurse note: pt to ED [facility] for tachycardia and dizziness. Originally being seen for congestion.

## 2022-12-28 ENCOUNTER — Ambulatory Visit: Payer: Medicare PPO | Admitting: Dermatology

## 2022-12-28 NOTE — Telephone Encounter (Signed)
Pt aware.   At this time he declines. He states he does not have the money for any more tests/procedures.

## 2023-01-24 NOTE — Progress Notes (Signed)
 Chief Complaint:   Chief Complaint  Patient presents with  . Follow-up    Subjective:   [name] is a 89 y.o. male in today for follow-up of his chronic medical problems.  Current Outpatient Medications  Medication Sig Dispense Refill  . ACCU-CHEK GUIDE GLUCOSE METER Misc 1 each as directed 1 each 0  . ACCU-CHEK GUIDE TEST STRIPS test strip 1 each (1 strip total) 3 (three) times daily Use as instructed. 100 each 5  . acetaminophen  (TYLENOL ) 325 MG tablet Take by mouth    . apixaban  (ELIQUIS ) 5 mg tablet TAKE 1 TABLET BY MOUTH EVERY 12 HOURS 180 tablet 3  . atorvastatin  (LIPITOR) 40 MG tablet Take 1 tablet (40 mg total) by mouth every evening 90 tablet 3  . blood glucose diagnostic (ACCU-CHEK AVIVA PLUS TEST STRP) test strip 1 each (1 strip total) by XX route 3 (three) times daily Dx E11.9 100 each 5  . blood glucose diagnostic test strip USE TO CHECK BLOOD SUGAR THREE TIMES DAILY AS INSTRUCTED 100 each 0  . blood-glucose meter (ACCU-CHEK AVIVA PLUS METER) Misc 1 each by XX route as directed. Dx E11.9 1 each 0  . cholecalciferol  (VITAMIN D3) 1,000 unit capsule Take 1,000 Units by mouth once daily.    . colchicine (COLCRYS) 0.6 mg tablet Take 1 daily as need for gout. 10 tablet 0  . cyanocobalamin (VITAMIN B12) 100 MCG tablet Take 100 mcg by mouth once daily.    . dilTIAZem  (CARDIZEM  CD) 180 MG CD capsule Take 1 capsule (180 mg total) by mouth once daily 30 capsule 11  . lancets Use 1 each 3 (three) times daily Compatible with Accu Chek guide 100 each 5  . lisinopriL -hydroCHLOROthiazide  (ZESTORETIC ) 20-12.5 mg tablet Take 1 tablet by mouth 2 (two) times daily 60 tablet 11  . colchicine (COLCRYS) 0.6 mg tablet Take 1 daily (Patient not taking: Reported on 09/08/2022) 10 tablet 0   No current facility-administered medications for this visit.    Allergies as of 01/24/2023 - Reviewed 01/24/2023  Allergen Reaction Noted  . Morphine  Unknown   . Sulfa (sulfonamide antibiotics) Unknown    . Codeine Nausea And Vomiting 12/26/2014    Past Medical History:  Diagnosis Date  . Benign prostatic hypertrophy   . Hiatal hernia with gastroesophageal reflux   . Hypogonadism in male   . Nephrolithiasis   . Osteoarthritis   . PUD (peptic ulcer disease)     Past Surgical History:  Procedure Laterality Date  . CORONARY ARTERY BYPASS GRAFT  01/25/1988  . ROBOT ASSISTED LAPAROSCOPIC APPENDECTOMY  06/22/2022   Dr. Lucas Catchings  . ROBOT ASSISTED LAPAROSCOPIC REPAIR INGUINAL HERNIA Right 10/28/2022   Dr Lucas Catchings  . ABDOMINAL AORTIC ANEURYSM REPAIR    . Laparoscopic cholecystectomy 09/06/08       Family History  Problem Relation Name Age of Onset  . High blood pressure (Hypertension) Other    . Heart disease Other      Social History:  reports that he quit smoking about 35 years ago. His smoking use included cigarettes. He started smoking about 69 years ago. He has a 34 pack-year smoking history. He has never used smokeless tobacco. He reports that he does not drink alcohol and does not use drugs.  Results for orders placed or performed in visit on 01/16/23  Basic Metabolic Panel (BMP)  Result Value Ref Range   Glucose 195 (H) 70 - 110 mg/dL   Sodium 858 863 - 854 mmol/L  Potassium 4.6 3.6 - 5.1 mmol/L   Chloride 99 97 - 109 mmol/L   Carbon Dioxide (CO2) 32.6 (H) 22.0 - 32.0 mmol/L   Calcium  9.8 8.7 - 10.3 mg/dL   Urea Nitrogen (BUN) 28 (H) 7 - 25 mg/dL   Creatinine 1.5 (H) 0.7 - 1.3 mg/dL   Glomerular Filtration Rate (eGFR) 44 (L) >60 mL/min/1.73sq m   BUN/Crea Ratio 18.7 6.0 - 20.0   Anion Gap w/K 14.0 6.0 - 16.0  Hemoglobin A1C  Result Value Ref Range   Hemoglobin A1C 6.5 (H) 4.2 - 5.6 %   Average Blood Glucose (Calc) 140 mg/dL   Narrative   Normal Range:    4.2 - 5.6% Increased Risk:  5.7 - 6.4% Diabetes:        >= 6.5% Glycemic Control for adults with diabetes:  <7%        ROS:  General: No fever, chills or recent illness. No change in  weight Skin:   No skin lesions, growths, masses, rashes, pruritus  HEENT: No change in vision or hearing. No pain or difficulty with swallowing Respiratory: No cough or shortness of breath CV:  No chest pain or palpitations GI:  No pain, dyspepsia or change in bowel habits GU:  No dysuria, frequency, or hesitancy MSK:  No joint pain or injury Neurological: No headaches, changes in mental status, loss of sensation or strength Endocrine:  No heat or cold intolerance, polydipsia, polyuria  Objective:   Body mass index is 20.4 kg/m.  BP (!) 140/78   Pulse 55   Ht 182.9 cm (6')   Wt 68.2 kg (150 lb 6.4 oz)   SpO2 99%   BMI 20.40 kg/m   General: WD/WN male, in no acute distress HEENT: Pupils equal and round, EOMI. oral mucosa moist.  Oropharynx clear. Neck: supple, trachea midline; no thyromegaly Respiratory:clear to auscultation.  No dullness to percussion.  No use of accessory muscles. 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  Atrial fibrillation.  Rate controlled.  He is on Eliquis  for anticoagulation. 2.  Hypertension.  Continue current medications. 3.  Peripheral vascular disease.  Stable. 4.  Diabetes.  Hemoglobin A1c is 6.5. 5.  Stage III chronic kidney disease.  GFR is stable. 6.  Hyperlipidemia.  Continue statin.    Goals     . * Maintain health/healthy lifestyle (pt-stated)    . * Maintain health/healthy lifestyle (pt-stated)    . * Maintain health/healthy lifestyle (pt-stated)       Goals       Patient Stated   . * Maintain health/healthy lifestyle (pt-stated)    . * Maintain health/healthy lifestyle  (pt-stated)    . * Pt declined goals (pt-stated)      Other   .  Patient Goals      Patient goals declined           . * Maintain health/healthy lifestyle (pt-stated)       Goals       Patient Stated   . * Maintain health/healthy lifestyle (pt-stated)    . * Maintain health/healthy lifestyle (pt-stated)    . * Maintain health/healthy lifestyle (pt-stated)       Goals  Patient Stated   . * Maintain health/healthy lifestyle (pt-stated)    . * Maintain health/healthy lifestyle (pt-stated)    . * Pt declined goals (pt-stated)      Other   .  Patient Goals      Patient goals declined           . * Pt declined goals (pt-stated)      Other   .  Patient Goals      Patient goals declined           .  Patient Goals      Patient goals declined    . * Pt declined goals (pt-stated)        NORLEEN ALM ROWER, MD  Portions of this note were created using dictation software and may contain typographical errors.  *Some images could not be shown.

## 2023-03-01 NOTE — Progress Notes (Signed)
 Patient here for dressing change, and wound evaluation.  Wound appears to be healing well.  Gelfoam is slowly dissolving.  Wound redressed by nursing staff.  No need for further follow-up.

## 2023-06-14 ENCOUNTER — Ambulatory Visit: Payer: Medicare PPO | Admitting: Dermatology

## 2023-07-11 ENCOUNTER — Encounter: Payer: Self-pay | Admitting: Dermatology

## 2023-07-11 ENCOUNTER — Ambulatory Visit (INDEPENDENT_AMBULATORY_CARE_PROVIDER_SITE_OTHER): Admitting: Dermatology

## 2023-07-11 DIAGNOSIS — L578 Other skin changes due to chronic exposure to nonionizing radiation: Secondary | ICD-10-CM

## 2023-07-11 DIAGNOSIS — Z85820 Personal history of malignant melanoma of skin: Secondary | ICD-10-CM | POA: Diagnosis not present

## 2023-07-11 DIAGNOSIS — W908XXA Exposure to other nonionizing radiation, initial encounter: Secondary | ICD-10-CM

## 2023-07-11 DIAGNOSIS — L570 Actinic keratosis: Secondary | ICD-10-CM

## 2023-07-11 DIAGNOSIS — Z8589 Personal history of malignant neoplasm of other organs and systems: Secondary | ICD-10-CM

## 2023-07-11 DIAGNOSIS — L820 Inflamed seborrheic keratosis: Secondary | ICD-10-CM

## 2023-07-11 DIAGNOSIS — Z85828 Personal history of other malignant neoplasm of skin: Secondary | ICD-10-CM

## 2023-07-11 DIAGNOSIS — L821 Other seborrheic keratosis: Secondary | ICD-10-CM | POA: Diagnosis not present

## 2023-07-11 NOTE — Patient Instructions (Addendum)

## 2023-07-11 NOTE — Progress Notes (Signed)
 Follow-Up Visit   Subjective  [name] is a 89 y.o. male who presents for the following: 6 months f/u on precancers.  The patient has spots, moles and lesions to be evaluated, some may be new or changing and the patient may have concern these could be cancer.  The following portions of the chart were reviewed this encounter and updated as appropriate: medications, allergies, medical history  Review of Systems:  No other skin or systemic complaints except as noted in HPI or Assessment and Plan.  Objective  Well appearing patient in no apparent distress; mood and affect are within normal limits.  A focused examination was performed of the following areas: Face,scalp,arms   Relevant exam findings are noted in the Assessment and Plan.  face,scalp,ears,arms,hands x 32 (32) Erythematous thin papules/macules with gritty scale.  scalp,arms,hands x 15 (15) Stuck-on, waxy, tan-brown papules and plaques -- Discussed benign etiology and prognosis.   Assessment & Plan   HISTORY OF ACTINIC KERATOSIS  Left distal forearm- treated with 5FU/Calcipotriene cream Red patch with crust-healing  Observe if not gone at next office visit may consider biopsy   AK (ACTINIC KERATOSIS) (32) face,scalp,ears,arms,hands x 32 (32) ACTINIC DAMAGE - chronic, secondary to cumulative UV radiation exposure/sun exposure over time - diffuse scaly erythematous macules with underlying dyspigmentation - Recommend daily broad spectrum sunscreen SPF 30+ to sun-exposed areas, reapply every 2 hours as needed.  - Recommend staying in the shade or wearing long sleeves, sun glasses (UVA+UVB protection) and wide brim hats (4-inch brim around the entire circumference of the hat). - Call for new or changing lesions.  Destruction of lesion - face,scalp,ears,arms,hands x 32 (32) Complexity: simple   Destruction method: cryotherapy   Informed consent: discussed and consent obtained   Timeout:  patient name, date of  birth, surgical site, and procedure verified Lesion destroyed using liquid nitrogen: Yes   Region frozen until ice ball extended beyond lesion: Yes   Outcome: patient tolerated procedure well with no complications   Post-procedure details: wound care instructions given   INFLAMED SEBORRHEIC KERATOSIS (15) scalp,arms,hands x 15 (15) Symptomatic, irritating, patient would like treated.  Destruction of lesion - scalp,arms,hands x 15 (15) Complexity: simple   Destruction method: cryotherapy   Informed consent: discussed and consent obtained   Timeout:  patient name, date of birth, surgical site, and procedure verified Lesion destroyed using liquid nitrogen: Yes   Region frozen until ice ball extended beyond lesion: Yes   Outcome: patient tolerated procedure well with no complications   Post-procedure details: wound care instructions given    SEBORRHEIC KERATOSIS - Stuck-on, waxy, tan-brown papules and/or plaques  - Benign-appearing - Discussed benign etiology and prognosis. - Observe - Call for any changes  HISTORY OF MELANOMA IN SITU - No evidence of recurrence today - Recommend regular full body skin exams - Recommend daily broad spectrum sunscreen SPF 30+ to sun-exposed areas, reapply every 2 hours as needed.  - Call if any new or changing lesions are noted between office visits   HISTORY OF BASAL CELL CARCINOMA OF THE SKIN - No evidence of recurrence today - Recommend regular full body skin exams - Recommend daily broad spectrum sunscreen SPF 30+ to sun-exposed areas, reapply every 2 hours as needed.  - Call if any new or changing lesions are noted between office visits   HISTORY OF SQUAMOUS CELL CARCINOMA OF THE SKIN - No evidence of recurrence today - No lymphadenopathy - Recommend regular full body skin exams - Recommend  daily broad spectrum sunscreen SPF 30+ to sun-exposed areas, reapply every 2 hours as needed.  - Call if any new or changing lesions are noted between  office visits  Return in about 8 months (around 03/12/2024) for AKs, ISKs .  IClara Crisp, CMA, am acting as scribe for Celine Collard, MD .   Documentation: I have reviewed the above documentation for accuracy and completeness, and I agree with the above.  Celine Collard, MD

## 2023-08-01 ENCOUNTER — Emergency Department

## 2023-08-01 ENCOUNTER — Emergency Department
Admission: EM | Admit: 2023-08-01 | Discharge: 2023-08-01 | Disposition: A | Discharge status: Home / Self Care | Attending: Emergency Medicine | Admitting: Emergency Medicine

## 2023-08-01 DIAGNOSIS — R42 Dizziness and giddiness: Secondary | ICD-10-CM | POA: Insufficient documentation

## 2023-08-01 LAB — COMPREHENSIVE METABOLIC PANEL WITH GFR
ALT: 12 U/L (ref 0–44)
AST: 23 U/L (ref 15–41)
Albumin: 4 g/dL (ref 3.5–5.0)
Alkaline Phosphatase: 60 U/L (ref 38–126)
Anion gap: 10 (ref 5–15)
BUN: 30 mg/dL — ABNORMAL HIGH (ref 8–23)
CO2: 29 mmol/L (ref 22–32)
Calcium: 9.7 mg/dL (ref 8.9–10.3)
Chloride: 102 mmol/L (ref 98–111)
Creatinine, Ser: 1.42 mg/dL — ABNORMAL HIGH (ref 0.61–1.24)
GFR, Estimated: 47 mL/min — ABNORMAL LOW (ref >60)
Glucose, Bld: 203 mg/dL — ABNORMAL HIGH (ref 70–99)
Potassium: 3.8 mmol/L (ref 3.5–5.1)
Sodium: 141 mmol/L (ref 135–145)
Total Bilirubin: 1.5 mg/dL — ABNORMAL HIGH (ref 0.0–1.2)
Total Protein: 6.7 g/dL (ref 6.5–8.1)

## 2023-08-01 LAB — CBC
HCT: 36.3 % — ABNORMAL LOW (ref 39.0–52.0)
Hemoglobin: 11.8 g/dL — ABNORMAL LOW (ref 13.0–17.0)
MCH: 30.9 pg (ref 26.0–34.0)
MCHC: 32.5 g/dL (ref 30.0–36.0)
MCV: 95 fL (ref 80.0–100.0)
Platelets: 176 K/uL (ref 150–400)
RBC: 3.82 MIL/uL — ABNORMAL LOW (ref 4.22–5.81)
RDW: 14.6 % (ref 11.5–15.5)
WBC: 5.6 K/uL (ref 4.0–10.5)
nRBC: 0 % (ref 0.0–0.2)

## 2023-08-01 LAB — URINALYSIS, ROUTINE W REFLEX MICROSCOPIC
Bilirubin Urine: NEGATIVE
Glucose, UA: 150 mg/dL — AB
Hgb urine dipstick: NEGATIVE
Ketones, ur: NEGATIVE mg/dL
Leukocytes,Ua: NEGATIVE
Nitrite: NEGATIVE
Protein, ur: NEGATIVE mg/dL
Specific Gravity, Urine: 1.016 (ref 1.005–1.030)
pH: 6 (ref 5.0–8.0)

## 2023-08-01 MED ORDER — MECLIZINE HCL 12.5 MG PO TABS: 12.5000 mg | ORAL_TABLET | Freq: Three times a day (TID) | ORAL | 0 refills | Status: AC | PRN

## 2023-08-01 MED ORDER — MECLIZINE HCL 25 MG PO TABS
12.5000 mg | ORAL_TABLET | Freq: Once | ORAL | Status: AC
  Administered 2023-08-01: 12.5 mg via ORAL
  Filled 2023-08-01: qty 1

## 2023-08-01 NOTE — ED Notes (Signed)
 Patient transported to CT

## 2023-08-01 NOTE — ED Provider Notes (Signed)
 [facility] Provider Note    Event Date/Time   First MD Initiated Contact with Patient 08/01/23 1005     (approximate)   History   Dizziness   HPI BRAND SIEVER is a 89 y.o. male  Who presents for evaluation of episodes of lightheadedness that have been occurring over the last few days.  However, he states that he has had this kind of thing happen to him in the past.  In the past it has been much worse where he feels like the room is spinning and he gets very sick to his stomach and he vomits.  Recently, he has just noticed that if he sits up or stands up too quickly, he gets lightheaded and feels like the room spins just a little bit and sometimes it makes him nauseated.  After a few seconds, however, the symptoms completely resolved.    He said he has been eating and drinking normally.  He has not had any recent injuries other than a fall a couple of days ago where he caught himself with his left hand.  He has some bleeding from his right ear but he said that that happens sometimes.  He had a large dermatological procedure years ago and sometimes he bleeds from inside his ear when he swallows it with a cotton swab, which he did just recently.  He said that his hearing is not affected.  He has no headache or neck pain or stiffness.  No chest pain or shortness of breath.  He is having normal bowel and bladder habits.     Physical Exam   Triage Vital Signs: ED Triage Vitals [08/01/23 0938]  Encounter Vitals Group     BP 129/81     Girls Systolic BP Percentile      Girls Diastolic BP Percentile      Boys Systolic BP Percentile      Boys Diastolic BP Percentile      Pulse Rate 87     Resp 18     Temp 98 F (36.7 C)     Temp src      SpO2 100 %     Weight 70.3 kg (155 lb)     Height 1.829 m (6')     Head Circumference      Peak Flow      Pain Score 0     Pain Loc      Pain Education      Exclude from Growth Chart     Most recent vital  signs: Vitals:   08/01/23 1100 08/01/23 1200  BP: (!) 151/61 (!) 152/83  Pulse: (!) 51 (!) 56  Resp: 18 15  Temp:    SpO2: 100% 100%    General: Awake, alert, elderly but coherent and conversant.  Spry and seemingly active for his age. HEENT: Pupils are equal and reactive.  No nystagmus.  Extraocular movement is intact.  He has a small amount of blood in the right ear canal that is coming from a small skin lesion on the inside that he seems to have nicked with a cotton swab.  Tympanic membrane is intact and there is no hemotympanum.  Left ear is normal in appearance. CV:  Good peripheral perfusion.  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 Resp:  Normal effort. Speaking easily and comfortably, no accessory muscle usage nor intercostal retractions.  Lungs clear to auscultation. Abd:  No distention.  Other:  Patient has  no appreciable focal neurological deficit.  When the patient sat up on the side of his bed he said he started to feel the symptoms a little bit but then they went away within about 5 seconds.  I then had him stand up fully on his feet.  Similarly, he felt a little bit lightheaded and like the room started to spin but then he stabilized and was asymptomatic.  Negative Romberg, negative pronator drift.  He is able to ambulate without difficulty if he takes just a little bit of time when he changes position.  No dysmetria.   ED Results / Procedures / Treatments   Labs (all labs ordered are listed, but only abnormal results are displayed) Labs Reviewed  COMPREHENSIVE METABOLIC PANEL WITH GFR - Abnormal; Notable for the following components:      Result Value   Glucose, Bld 203 (*)    BUN 30 (*)    Creatinine, Ser 1.42 (*)    Total Bilirubin 1.5 (*)    GFR, Estimated 47 (*)    All other components within normal limits  CBC - Abnormal; Notable for the following components:   RBC 3.82 (*)    Hemoglobin 11.8 (*)    HCT 36.3 (*)    All other  components within normal limits  URINALYSIS, ROUTINE W REFLEX MICROSCOPIC - Abnormal; Notable for the following components:   Color, Urine YELLOW (*)    APPearance CLEAR (*)    Glucose, UA 150 (*)    All other components within normal limits     EKG  ED ECG REPORT #1 I, Darleene Dome, the attending physician, personally viewed and interpreted this ECG.  Date: 08/01/2023 EKG Time: 9:43 AM Rate: 61 Rhythm:   Atrial fibrillation with occasional PVC QRS Axis: normal Intervals: Normal other than A-fib ST/T Wave abnormalities: Non-specific ST segment / T-wave changes, but no clear evidence of acute ischemia. Narrative Interpretation: no definitive evidence of acute ischemia; does not meet STEMI criteria.   ED ECG REPORT #2 I, Darleene Dome, the attending physician, personally viewed and interpreted this ECG.  Date: 08/01/2023 EKG Time: 13: 31 Rate: 51 Rhythm: Atrial fibrillation with occasional PVC QRS Axis: normal Intervals: Normal other than A-fib ST/T Wave abnormalities: Non-specific ST segment / T-wave changes, but no clear evidence of acute ischemia. Narrative Interpretation: no definitive evidence of acute ischemia; does not meet STEMI criteria.    RADIOLOGY I independently viewed and interpreted the patient's head CT and I see no evidence of acute intracranial bleed or other head injury.  Radiology commented on chronic white matter changes.   PROCEDURES:  Critical Care performed: No  .1-3 Lead EKG Interpretation  Performed by: Dome Darleene, MD Authorized by: Dome Darleene, MD     Interpretation: normal     ECG rate:  62   ECG rate assessment: normal     Rhythm: atrial fibrillation     Ectopy: PVCs     Conduction: normal       IMPRESSION / MDM / ASSESSMENT AND PLAN / ED COURSE  I reviewed the triage vital signs and the nursing notes.                              Differential diagnosis includes, but is not limited to, benign positional vertigo, volume  depletion, electrolyte or metabolic abnormality, acute intracranial bleed, CVA.  Patient's presentation is most consistent with acute presentation with potential threat to life or bodily  function.  Labs/studies ordered: Urinalysis, CMP, CBC, head CT, EKG x 2  Interventions/Medications given:  Medications  meclizine  (ANTIVERT ) tablet 12.5 mg (12.5 mg Oral Given 08/01/23 1239)    (Note:  hospital course my include additional interventions and/or labs/studies not listed above.)   Patient is elderly but remarkably well for his age and chronic medical issues.  He is alert and oriented x 3.  He has a slight amount of vertigo or lightheadedness that is clearly related to positional changes.  No focal neurological deficits.  I repeated an EKG because his initial EKG was somewhat concerning with a relatively low rate and PVCs, but he has a history of atrial fibrillation which is why he takes Eliquis .  He states that he feels well as long as he takes his time standing and moving around.  His labs are relatively reassuring with stable chronic kidney disease, no indication of infection, normal hemoglobin, no leukocytosis.  Head CT shows no acute findings.  The patient states that in the past he has used meclizine  and it has helped.  My preference is generally not to give meclizine  to elderly patients, but in this case I think it may be appropriate so use very low-dose.  He is comfortable with the plan for discharge and outpatient follow-up with a prescription for meclizine  to help if he continues to have symptoms.  I continued to encourage oral intake and very close outpatient follow-up  The patient is on the cardiac monitor to evaluate for evidence of arrhythmia and/or significant heart rate changes.       FINAL CLINICAL IMPRESSION(S) / ED DIAGNOSES   Final diagnoses:  Lightheadedness     Rx / DC Orders   ED Discharge Orders          Ordered    meclizine  (ANTIVERT ) 12.5 MG tablet  3 times  daily PRN        08/01/23 1238             Note:  This document was prepared using Dragon voice recognition software and may include unintentional dictation errors.   Gordan Huxley, MD 08/01/23 1352

## 2023-08-01 NOTE — ED Triage Notes (Signed)
 Pt presents to the ED via POV from home for dizziness x2 days. Pt states that this is worse with movement. Pt reports that nausea accompanies this dizziness.   Pt does report a hx of vertigo.  Pt also reports having a mechanical fall and hitting his nose. Pt states that this wasn't a hard fall and that he fell into some brush. Pt states that the dizziness was already present PRIOR to the fall. Reports taking eliquis .

## 2023-08-01 NOTE — Discharge Instructions (Addendum)
 You have been seen today in the Emergency Department (ED)  for lightheadedness and possible vertigo.  Your workup including labs and EKG show reassuring results.  Your symptoms may be due to mild dehydration, so it is important that you drink plenty of non-alcoholic fluids.  Please call your regular doctor as soon as possible to schedule the next available clinic appointment to follow up with him/her regarding your visit to the ED and your symptoms.  Return to the Emergency Department (ED)  if you have any further syncopal episodes (pass out again) or develop ANY chest pain, pressure, tightness, trouble breathing, sudden sweating, or other symptoms that concern you.

## 2023-08-01 NOTE — ED Triage Notes (Signed)
 First Nurse Note: Patient to ED from [facility] for dizziness- worse with movement. Unsteady gait. Blurred vision and nausea with movement. Right ear ringing and bleeding. VS WNL at [facility]

## 2023-08-01 NOTE — ED Notes (Incomplete)
 Pt provided discharge instructions and prescription information. Pt was given the opportunity to ask questions and questions were answered.   Pt noted to have an even,

## 2023-09-22 ENCOUNTER — Encounter (HOSPITAL_COMMUNITY): Payer: Self-pay

## 2023-09-22 ENCOUNTER — Emergency Department (HOSPITAL_COMMUNITY): Payer: Self-pay

## 2023-09-22 DIAGNOSIS — Z789 Other specified health status: Secondary | ICD-10-CM | POA: Diagnosis not present

## 2023-09-22 DIAGNOSIS — H5702 Anisocoria: Secondary | ICD-10-CM | POA: Diagnosis present

## 2023-09-22 DIAGNOSIS — R001 Bradycardia, unspecified: Secondary | ICD-10-CM | POA: Diagnosis present

## 2023-09-22 DIAGNOSIS — Z66 Do not resuscitate: Secondary | ICD-10-CM | POA: Diagnosis present

## 2023-09-22 DIAGNOSIS — Z515 Encounter for palliative care: Secondary | ICD-10-CM | POA: Diagnosis not present

## 2023-09-22 DIAGNOSIS — R680 Hypothermia, not associated with low environmental temperature: Secondary | ICD-10-CM | POA: Diagnosis present

## 2023-09-22 DIAGNOSIS — R092 Respiratory arrest: Secondary | ICD-10-CM | POA: Diagnosis present

## 2023-09-22 DIAGNOSIS — E785 Hyperlipidemia, unspecified: Secondary | ICD-10-CM | POA: Diagnosis present

## 2023-09-22 DIAGNOSIS — Z7901 Long term (current) use of anticoagulants: Secondary | ICD-10-CM

## 2023-09-22 DIAGNOSIS — S0093XA Contusion of unspecified part of head, initial encounter: Secondary | ICD-10-CM | POA: Diagnosis present

## 2023-09-22 DIAGNOSIS — G936 Cerebral edema: Secondary | ICD-10-CM | POA: Diagnosis present

## 2023-09-22 DIAGNOSIS — S06A0XA Traumatic brain compression without herniation, initial encounter: Secondary | ICD-10-CM | POA: Diagnosis present

## 2023-09-22 DIAGNOSIS — Z79899 Other long term (current) drug therapy: Secondary | ICD-10-CM

## 2023-09-22 DIAGNOSIS — E119 Type 2 diabetes mellitus without complications: Secondary | ICD-10-CM | POA: Diagnosis present

## 2023-09-22 DIAGNOSIS — Y92009 Unspecified place in unspecified non-institutional (private) residence as the place of occurrence of the external cause: Secondary | ICD-10-CM

## 2023-09-22 DIAGNOSIS — S065XAA Traumatic subdural hemorrhage with loss of consciousness status unknown, initial encounter: Secondary | ICD-10-CM | POA: Diagnosis present

## 2023-09-22 DIAGNOSIS — I4891 Unspecified atrial fibrillation: Secondary | ICD-10-CM | POA: Diagnosis present

## 2023-09-22 DIAGNOSIS — W19XXXA Unspecified fall, initial encounter: Principal | ICD-10-CM | POA: Diagnosis present

## 2023-09-22 DIAGNOSIS — H5704 Mydriasis: Secondary | ICD-10-CM | POA: Diagnosis present

## 2023-09-22 DIAGNOSIS — Z602 Problems related to living alone: Secondary | ICD-10-CM | POA: Diagnosis present

## 2023-09-22 DIAGNOSIS — R0902 Hypoxemia: Secondary | ICD-10-CM | POA: Diagnosis present

## 2023-09-22 DIAGNOSIS — I6200 Nontraumatic subdural hemorrhage, unspecified: Secondary | ICD-10-CM | POA: Diagnosis not present

## 2023-09-22 DIAGNOSIS — T68XXXA Hypothermia, initial encounter: Secondary | ICD-10-CM | POA: Diagnosis not present

## 2023-09-22 LAB — PROTIME-INR
INR: 1.2 (ref 0.8–1.2)
Prothrombin Time: 15.8 s — ABNORMAL HIGH (ref 11.4–15.2)

## 2023-09-22 LAB — COMPREHENSIVE METABOLIC PANEL WITH GFR
ALT: 16 U/L (ref 0–44)
AST: 33 U/L (ref 15–41)
Albumin: 3.7 g/dL (ref 3.5–5.0)
Alkaline Phosphatase: 63 U/L (ref 38–126)
Anion gap: 16 — ABNORMAL HIGH (ref 5–15)
BUN: 37 mg/dL — ABNORMAL HIGH (ref 8–23)
CO2: 26 mmol/L (ref 22–32)
Calcium: 9.7 mg/dL (ref 8.9–10.3)
Chloride: 95 mmol/L — ABNORMAL LOW (ref 98–111)
Creatinine, Ser: 1.31 mg/dL — ABNORMAL HIGH (ref 0.61–1.24)
GFR, Estimated: 51 mL/min — ABNORMAL LOW (ref >60)
Glucose, Bld: 249 mg/dL — ABNORMAL HIGH (ref 70–99)
Potassium: 3.7 mmol/L (ref 3.5–5.1)
Sodium: 137 mmol/L (ref 135–145)
Total Bilirubin: 1 mg/dL (ref 0.0–1.2)
Total Protein: 6.5 g/dL (ref 6.5–8.1)

## 2023-09-22 LAB — CBC
HCT: 35.6 % — ABNORMAL LOW (ref 39.0–52.0)
Hemoglobin: 11.4 g/dL — ABNORMAL LOW (ref 13.0–17.0)
MCH: 30.7 pg (ref 26.0–34.0)
MCHC: 32 g/dL (ref 30.0–36.0)
MCV: 96 fL (ref 80.0–100.0)
Platelets: 171 K/uL (ref 150–400)
RBC: 3.71 MIL/uL — ABNORMAL LOW (ref 4.22–5.81)
RDW: 13.7 % (ref 11.5–15.5)
WBC: 8.5 K/uL (ref 4.0–10.5)
nRBC: 0 % (ref 0.0–0.2)

## 2023-09-22 LAB — SAMPLE TO BLOOD BANK

## 2023-09-22 LAB — I-STAT CHEM 8, ED
BUN: 38 mg/dL — ABNORMAL HIGH (ref 8–23)
Calcium, Ion: 1.26 mmol/L (ref 1.15–1.40)
Chloride: 96 mmol/L — ABNORMAL LOW (ref 98–111)
Creatinine, Ser: 1.2 mg/dL (ref 0.61–1.24)
Glucose, Bld: 247 mg/dL — ABNORMAL HIGH (ref 70–99)
HCT: 36 % — ABNORMAL LOW (ref 39.0–52.0)
Hemoglobin: 12.2 g/dL — ABNORMAL LOW (ref 13.0–17.0)
Potassium: 3.8 mmol/L (ref 3.5–5.1)
Sodium: 138 mmol/L (ref 135–145)
TCO2: 30 mmol/L (ref 22–32)

## 2023-09-22 LAB — CK: Total CK: 732 U/L — ABNORMAL HIGH (ref 49–397)

## 2023-09-22 LAB — ETHANOL: Alcohol, Ethyl (B): <15 mg/dL (ref <15)

## 2023-09-22 LAB — I-STAT CG4 LACTIC ACID, ED: Lactic Acid, Venous: 2.5 mmol/L (ref 0.5–1.9)

## 2023-09-22 MED ORDER — MORPHINE SULFATE (PF) 2 MG/ML IV SOLN
1.0000 mg | INTRAVENOUS | Status: DC | PRN
  Filled 2023-09-22: qty 1

## 2023-09-22 MED ORDER — ACETAMINOPHEN 650 MG RE SUPP: 650.0000 mg | Freq: Four times a day (QID) | RECTAL | Status: DC | PRN

## 2023-09-22 MED ORDER — MORPHINE SULFATE (PF) 2 MG/ML IV SOLN
1.0000 mg | INTRAVENOUS | Status: DC
  Filled 2023-09-22 (×3): qty 1

## 2023-09-22 MED ORDER — GLYCOPYRROLATE 1 MG PO TABS: 1.0000 mg | ORAL_TABLET | ORAL | Status: DC | PRN

## 2023-09-22 MED ORDER — GLYCOPYRROLATE 0.2 MG/ML IJ SOLN: 0.2000 mg | INTRAMUSCULAR | Status: DC | PRN

## 2023-09-22 MED ORDER — POLYVINYL ALCOHOL 1.4 % OP SOLN: 1.0000 [drp] | Freq: Four times a day (QID) | OPHTHALMIC | Status: DC | PRN

## 2023-09-22 MED ORDER — SODIUM CHLORIDE 0.9 % IV SOLN: INTRAVENOUS | Status: AC | PRN

## 2023-09-23 DIAGNOSIS — T68XXXA Hypothermia, initial encounter: Secondary | ICD-10-CM

## 2023-09-23 DIAGNOSIS — S065XAA Traumatic subdural hemorrhage with loss of consciousness status unknown, initial encounter: Secondary | ICD-10-CM | POA: Diagnosis not present

## 2023-09-23 DIAGNOSIS — W19XXXA Unspecified fall, initial encounter: Secondary | ICD-10-CM

## 2023-09-23 DIAGNOSIS — Z789 Other specified health status: Secondary | ICD-10-CM

## 2023-09-23 DIAGNOSIS — S0093XA Contusion of unspecified part of head, initial encounter: Secondary | ICD-10-CM

## 2023-09-23 DIAGNOSIS — Z66 Do not resuscitate: Secondary | ICD-10-CM

## 2023-09-23 DIAGNOSIS — Z515 Encounter for palliative care: Secondary | ICD-10-CM

## 2023-09-25 DIAGNOSIS — 419620001 Death: Secondary | SNOMED CT

## 2023-09-25 DEATH — deceased

## 2024-03-12 ENCOUNTER — Ambulatory Visit: Admitting: Dermatology
